# Patient Record
Sex: Female | Born: 1945
Health system: Southern US, Community
[De-identification: ages and names within clinical notes are randomized; demographics above are authoritative.]

## PROBLEM LIST (undated history)

## (undated) DIAGNOSIS — D649 Anemia, unspecified: Secondary | ICD-10-CM

## (undated) DIAGNOSIS — Z8719 Personal history of other diseases of the digestive system: Secondary | ICD-10-CM

## (undated) DIAGNOSIS — R931 Abnormal findings on diagnostic imaging of heart and coronary circulation: Secondary | ICD-10-CM

## (undated) DIAGNOSIS — Z9289 Personal history of other medical treatment: Secondary | ICD-10-CM

## (undated) DIAGNOSIS — F419 Anxiety disorder, unspecified: Secondary | ICD-10-CM

## (undated) DIAGNOSIS — R079 Chest pain, unspecified: Secondary | ICD-10-CM

## (undated) DIAGNOSIS — R350 Frequency of micturition: Secondary | ICD-10-CM

## (undated) DIAGNOSIS — IMO0002 Reserved for concepts with insufficient information to code with codable children: Secondary | ICD-10-CM

## (undated) DIAGNOSIS — R3915 Urgency of urination: Secondary | ICD-10-CM

## (undated) DIAGNOSIS — R0989 Other specified symptoms and signs involving the circulatory and respiratory systems: Secondary | ICD-10-CM

## (undated) DIAGNOSIS — T7840XA Allergy, unspecified, initial encounter: Secondary | ICD-10-CM

## (undated) DIAGNOSIS — E164 Increased secretion of gastrin: Secondary | ICD-10-CM

## (undated) DIAGNOSIS — N3010 Interstitial cystitis (chronic) without hematuria: Secondary | ICD-10-CM

## (undated) DIAGNOSIS — R112 Nausea with vomiting, unspecified: Secondary | ICD-10-CM

## (undated) DIAGNOSIS — N83209 Unspecified ovarian cyst, unspecified side: Secondary | ICD-10-CM

## (undated) DIAGNOSIS — N189 Chronic kidney disease, unspecified: Secondary | ICD-10-CM

## (undated) DIAGNOSIS — R011 Cardiac murmur, unspecified: Secondary | ICD-10-CM

## (undated) DIAGNOSIS — M810 Age-related osteoporosis without current pathological fracture: Secondary | ICD-10-CM

## (undated) DIAGNOSIS — M1990 Unspecified osteoarthritis, unspecified site: Secondary | ICD-10-CM

## (undated) DIAGNOSIS — Z634 Disappearance and death of family member: Secondary | ICD-10-CM

## (undated) DIAGNOSIS — IMO0001 Reserved for inherently not codable concepts without codable children: Secondary | ICD-10-CM

## (undated) DIAGNOSIS — G8929 Other chronic pain: Secondary | ICD-10-CM

## (undated) DIAGNOSIS — R300 Dysuria: Secondary | ICD-10-CM

## (undated) DIAGNOSIS — N390 Urinary tract infection, site not specified: Secondary | ICD-10-CM

## (undated) DIAGNOSIS — H269 Unspecified cataract: Secondary | ICD-10-CM

## (undated) DIAGNOSIS — E039 Hypothyroidism, unspecified: Secondary | ICD-10-CM

## (undated) DIAGNOSIS — Z9889 Other specified postprocedural states: Secondary | ICD-10-CM

## (undated) DIAGNOSIS — I509 Heart failure, unspecified: Secondary | ICD-10-CM

## (undated) DIAGNOSIS — R42 Dizziness and giddiness: Secondary | ICD-10-CM

## (undated) DIAGNOSIS — M25469 Effusion, unspecified knee: Secondary | ICD-10-CM

## (undated) DIAGNOSIS — M545 Low back pain, unspecified: Secondary | ICD-10-CM

## (undated) DIAGNOSIS — G43909 Migraine, unspecified, not intractable, without status migrainosus: Secondary | ICD-10-CM

## (undated) DIAGNOSIS — J111 Influenza due to unidentified influenza virus with other respiratory manifestations: Secondary | ICD-10-CM

## (undated) DIAGNOSIS — E876 Hypokalemia: Secondary | ICD-10-CM

## (undated) DIAGNOSIS — R0602 Shortness of breath: Secondary | ICD-10-CM

## (undated) DIAGNOSIS — J42 Unspecified chronic bronchitis: Secondary | ICD-10-CM

## (undated) DIAGNOSIS — R937 Abnormal findings on diagnostic imaging of other parts of musculoskeletal system: Secondary | ICD-10-CM

## (undated) DIAGNOSIS — K219 Gastro-esophageal reflux disease without esophagitis: Secondary | ICD-10-CM

## (undated) DIAGNOSIS — E785 Hyperlipidemia, unspecified: Secondary | ICD-10-CM

## (undated) DIAGNOSIS — A159 Respiratory tuberculosis unspecified: Secondary | ICD-10-CM

## (undated) DIAGNOSIS — I10 Essential (primary) hypertension: Secondary | ICD-10-CM

## (undated) DIAGNOSIS — Z8711 Personal history of peptic ulcer disease: Secondary | ICD-10-CM

## (undated) HISTORY — DX: Chest pain, unspecified: R07.9

## (undated) HISTORY — DX: Reserved for inherently not codable concepts without codable children: IMO0001

## (undated) HISTORY — DX: Interstitial cystitis (chronic) without hematuria: N30.10

## (undated) HISTORY — PX: DILATION AND CURETTAGE OF UTERUS: SHX78

## (undated) HISTORY — DX: Reserved for concepts with insufficient information to code with codable children: IMO0002

## (undated) HISTORY — DX: Abnormal findings on diagnostic imaging of heart and coronary circulation: R93.1

## (undated) HISTORY — DX: Dysuria: R30.0

## (undated) HISTORY — DX: Urinary tract infection, site not specified: N39.0

## (undated) HISTORY — DX: Allergy, unspecified, initial encounter: T78.40XA

## (undated) HISTORY — DX: Heart failure, unspecified: I50.9

## (undated) HISTORY — DX: Abnormal findings on diagnostic imaging of other parts of musculoskeletal system: R93.7

## (undated) HISTORY — DX: Hypokalemia: E87.6

## (undated) HISTORY — DX: Disappearance and death of family member: Z63.4

## (undated) HISTORY — DX: Unspecified ovarian cyst, unspecified side: N83.209

## (undated) HISTORY — PX: CATARACT EXTRACTION W/ INTRAOCULAR LENS  IMPLANT, BILATERAL: SHX1307

## (undated) HISTORY — DX: Personal history of other medical treatment: Z92.89

## (undated) HISTORY — PX: CARDIAC CATHETERIZATION: SHX172

## (undated) HISTORY — DX: Chronic kidney disease, unspecified: N18.9

## (undated) HISTORY — DX: Increased secretion of gastrin: E16.4

## (undated) HISTORY — DX: Respiratory tuberculosis unspecified: A15.9

## (undated) HISTORY — DX: Anxiety disorder, unspecified: F41.9

## (undated) HISTORY — DX: Age-related osteoporosis without current pathological fracture: M81.0

## (undated) HISTORY — PX: BACK SURGERY: SHX140

## (undated) HISTORY — PX: APPENDECTOMY: SHX54

---
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
## 1970-08-26 HISTORY — PX: OVARIAN CYST SURGERY: SHX726

## 1972-08-26 DIAGNOSIS — Z9289 Personal history of other medical treatment: Secondary | ICD-10-CM

## 1972-08-26 HISTORY — PX: OOPHORECTOMY: SHX86

## 1972-08-26 HISTORY — DX: Personal history of other medical treatment: Z92.89

## 1972-08-26 HISTORY — PX: ABDOMINAL HYSTERECTOMY: SHX81

## 1982-08-26 HISTORY — PX: FOOT SURGERY: SHX648

## 1991-08-27 HISTORY — PX: MASTOIDECTOMY: SHX711

## 1991-12-25 HISTORY — PX: CHOLECYSTECTOMY OPEN: SUR202

## 1991-12-25 HISTORY — PX: LAPAROSCOPIC OVARIAN CYSTECTOMY: SUR786

## 1993-05-26 HISTORY — PX: OTHER SURGICAL HISTORY: SHX169

## 1995-11-25 HISTORY — PX: ANTERIOR AND POSTERIOR REPAIR: SHX1172

## 1995-11-25 HISTORY — PX: LAPAROSCOPIC LYSIS INTESTINAL ADHESIONS: SUR778

## 1998-03-08 ENCOUNTER — Ambulatory Visit (HOSPITAL_COMMUNITY): Admission: RE | Admit: 1998-03-08 | Discharge: 1998-03-08 | Payer: Self-pay | Admitting: Obstetrics and Gynecology

## 1998-05-31 ENCOUNTER — Encounter: Admission: RE | Admit: 1998-05-31 | Discharge: 1998-05-31 | Payer: Self-pay | Admitting: Family Medicine

## 1998-08-08 ENCOUNTER — Encounter: Admission: RE | Admit: 1998-08-08 | Discharge: 1998-08-08 | Payer: Self-pay | Admitting: Sports Medicine

## 1998-08-21 ENCOUNTER — Emergency Department (HOSPITAL_COMMUNITY): Admission: EM | Admit: 1998-08-21 | Discharge: 1998-08-21 | Payer: Self-pay | Admitting: Emergency Medicine

## 1998-08-29 ENCOUNTER — Encounter: Admission: RE | Admit: 1998-08-29 | Discharge: 1998-08-29 | Payer: Self-pay | Admitting: Family Medicine

## 1998-12-04 ENCOUNTER — Other Ambulatory Visit: Admission: RE | Admit: 1998-12-04 | Discharge: 1998-12-04 | Payer: Self-pay | Admitting: Obstetrics and Gynecology

## 1998-12-25 ENCOUNTER — Encounter: Admission: RE | Admit: 1998-12-25 | Discharge: 1998-12-25 | Payer: Self-pay | Admitting: Family Medicine

## 1998-12-28 ENCOUNTER — Encounter: Admission: RE | Admit: 1998-12-28 | Discharge: 1998-12-28 | Payer: Self-pay | Admitting: Family Medicine

## 1999-01-02 ENCOUNTER — Encounter: Admission: RE | Admit: 1999-01-02 | Discharge: 1999-01-02 | Payer: Self-pay | Admitting: Family Medicine

## 1999-01-30 ENCOUNTER — Encounter: Admission: RE | Admit: 1999-01-30 | Discharge: 1999-01-30 | Payer: Self-pay | Admitting: Family Medicine

## 1999-04-03 ENCOUNTER — Ambulatory Visit (HOSPITAL_COMMUNITY): Admission: RE | Admit: 1999-04-03 | Discharge: 1999-04-03 | Payer: Self-pay | Admitting: Family Medicine

## 1999-04-27 ENCOUNTER — Encounter (INDEPENDENT_AMBULATORY_CARE_PROVIDER_SITE_OTHER): Payer: Self-pay | Admitting: Specialist

## 1999-04-27 ENCOUNTER — Ambulatory Visit (HOSPITAL_COMMUNITY): Admission: RE | Admit: 1999-04-27 | Discharge: 1999-04-27 | Payer: Self-pay | Admitting: Gastroenterology

## 1999-06-21 ENCOUNTER — Encounter: Payer: Self-pay | Admitting: Family Medicine

## 1999-06-21 ENCOUNTER — Encounter: Admission: RE | Admit: 1999-06-21 | Discharge: 1999-06-21 | Payer: Self-pay | Admitting: Family Medicine

## 1999-07-27 DIAGNOSIS — E164 Increased secretion of gastrin: Secondary | ICD-10-CM

## 1999-07-27 HISTORY — DX: Increased secretion of gastrin: E16.4

## 1999-08-13 ENCOUNTER — Encounter (INDEPENDENT_AMBULATORY_CARE_PROVIDER_SITE_OTHER): Payer: Self-pay

## 1999-08-13 ENCOUNTER — Ambulatory Visit (HOSPITAL_COMMUNITY): Admission: RE | Admit: 1999-08-13 | Discharge: 1999-08-13 | Payer: Self-pay | Admitting: Gastroenterology

## 1999-08-26 ENCOUNTER — Emergency Department (HOSPITAL_COMMUNITY): Admission: EM | Admit: 1999-08-26 | Discharge: 1999-08-26 | Payer: Self-pay | Admitting: Emergency Medicine

## 1999-09-11 ENCOUNTER — Encounter: Admission: RE | Admit: 1999-09-11 | Discharge: 1999-09-11 | Payer: Self-pay | Admitting: Family Medicine

## 1999-09-14 ENCOUNTER — Encounter: Admission: RE | Admit: 1999-09-14 | Discharge: 1999-09-14 | Payer: Self-pay | Admitting: Family Medicine

## 1999-09-19 ENCOUNTER — Encounter: Admission: RE | Admit: 1999-09-19 | Discharge: 1999-09-19 | Payer: Self-pay | Admitting: Family Medicine

## 1999-09-28 ENCOUNTER — Ambulatory Visit (HOSPITAL_COMMUNITY): Admission: RE | Admit: 1999-09-28 | Discharge: 1999-09-28 | Payer: Self-pay | Admitting: Urology

## 1999-09-28 ENCOUNTER — Encounter: Payer: Self-pay | Admitting: Urology

## 1999-10-12 ENCOUNTER — Encounter (INDEPENDENT_AMBULATORY_CARE_PROVIDER_SITE_OTHER): Payer: Self-pay | Admitting: Specialist

## 1999-10-12 ENCOUNTER — Ambulatory Visit (HOSPITAL_COMMUNITY): Admission: RE | Admit: 1999-10-12 | Discharge: 1999-10-12 | Payer: Self-pay | Admitting: Urology

## 1999-10-25 DIAGNOSIS — N3010 Interstitial cystitis (chronic) without hematuria: Secondary | ICD-10-CM

## 1999-10-25 HISTORY — DX: Interstitial cystitis (chronic) without hematuria: N30.10

## 1999-11-06 ENCOUNTER — Encounter: Admission: RE | Admit: 1999-11-06 | Discharge: 1999-11-06 | Payer: Self-pay | Admitting: Family Medicine

## 1999-11-20 ENCOUNTER — Encounter: Admission: RE | Admit: 1999-11-20 | Discharge: 1999-11-20 | Payer: Self-pay | Admitting: Family Medicine

## 1999-12-19 ENCOUNTER — Other Ambulatory Visit: Admission: RE | Admit: 1999-12-19 | Discharge: 1999-12-19 | Payer: Self-pay | Admitting: Obstetrics and Gynecology

## 2000-01-31 ENCOUNTER — Encounter: Admission: RE | Admit: 2000-01-31 | Discharge: 2000-01-31 | Payer: Self-pay | Admitting: Family Medicine

## 2000-04-10 ENCOUNTER — Ambulatory Visit (HOSPITAL_COMMUNITY): Admission: RE | Admit: 2000-04-10 | Discharge: 2000-04-10 | Payer: Self-pay | Admitting: Family Medicine

## 2000-04-11 ENCOUNTER — Encounter: Admission: RE | Admit: 2000-04-11 | Discharge: 2000-04-11 | Payer: Self-pay | Admitting: Family Medicine

## 2000-08-05 ENCOUNTER — Encounter: Admission: RE | Admit: 2000-08-05 | Discharge: 2000-08-05 | Payer: Self-pay | Admitting: Family Medicine

## 2000-08-14 ENCOUNTER — Encounter: Payer: Self-pay | Admitting: Obstetrics and Gynecology

## 2000-08-14 ENCOUNTER — Ambulatory Visit (HOSPITAL_COMMUNITY): Admission: RE | Admit: 2000-08-14 | Discharge: 2000-08-14 | Payer: Self-pay | Admitting: Obstetrics and Gynecology

## 2000-09-12 ENCOUNTER — Encounter: Admission: RE | Admit: 2000-09-12 | Discharge: 2000-09-12 | Payer: Self-pay | Admitting: Family Medicine

## 2000-09-16 ENCOUNTER — Encounter: Admission: RE | Admit: 2000-09-16 | Discharge: 2000-09-16 | Payer: Self-pay | Admitting: Family Medicine

## 2000-11-24 ENCOUNTER — Encounter: Admission: RE | Admit: 2000-11-24 | Discharge: 2000-11-24 | Payer: Self-pay | Admitting: Family Medicine

## 2000-12-02 ENCOUNTER — Encounter: Admission: RE | Admit: 2000-12-02 | Discharge: 2000-12-02 | Payer: Self-pay | Admitting: Family Medicine

## 2001-02-03 DIAGNOSIS — IMO0001 Reserved for inherently not codable concepts without codable children: Secondary | ICD-10-CM

## 2001-02-03 HISTORY — DX: Reserved for inherently not codable concepts without codable children: IMO0001

## 2001-02-20 ENCOUNTER — Other Ambulatory Visit: Admission: RE | Admit: 2001-02-20 | Discharge: 2001-02-20 | Payer: Self-pay | Admitting: Obstetrics and Gynecology

## 2001-03-20 ENCOUNTER — Encounter: Admission: RE | Admit: 2001-03-20 | Discharge: 2001-03-20 | Payer: Self-pay | Admitting: Family Medicine

## 2001-04-15 ENCOUNTER — Ambulatory Visit (HOSPITAL_COMMUNITY): Admission: RE | Admit: 2001-04-15 | Discharge: 2001-04-15 | Payer: Self-pay | Admitting: Obstetrics and Gynecology

## 2001-04-15 ENCOUNTER — Encounter: Payer: Self-pay | Admitting: Obstetrics and Gynecology

## 2002-01-25 ENCOUNTER — Encounter: Admission: RE | Admit: 2002-01-25 | Discharge: 2002-01-25 | Payer: Self-pay | Admitting: Family Medicine

## 2002-02-18 ENCOUNTER — Encounter: Admission: RE | Admit: 2002-02-18 | Discharge: 2002-02-18 | Payer: Self-pay | Admitting: Family Medicine

## 2002-02-19 ENCOUNTER — Other Ambulatory Visit: Admission: RE | Admit: 2002-02-19 | Discharge: 2002-02-19 | Payer: Self-pay | Admitting: Obstetrics and Gynecology

## 2002-05-28 ENCOUNTER — Encounter (INDEPENDENT_AMBULATORY_CARE_PROVIDER_SITE_OTHER): Payer: Self-pay | Admitting: Specialist

## 2002-05-28 ENCOUNTER — Ambulatory Visit (HOSPITAL_COMMUNITY): Admission: RE | Admit: 2002-05-28 | Discharge: 2002-05-28 | Payer: Self-pay | Admitting: Gastroenterology

## 2002-09-15 ENCOUNTER — Encounter: Admission: RE | Admit: 2002-09-15 | Discharge: 2002-09-15 | Payer: Self-pay | Admitting: Family Medicine

## 2002-09-21 HISTORY — PX: CHOLESTEATOMA EXCISION: SHX1345

## 2002-09-24 ENCOUNTER — Encounter: Admission: RE | Admit: 2002-09-24 | Discharge: 2002-09-24 | Payer: Self-pay | Admitting: Family Medicine

## 2002-10-13 ENCOUNTER — Encounter: Admission: RE | Admit: 2002-10-13 | Discharge: 2002-10-13 | Payer: Self-pay | Admitting: Family Medicine

## 2002-10-25 ENCOUNTER — Encounter: Admission: RE | Admit: 2002-10-25 | Discharge: 2002-10-25 | Payer: Self-pay | Admitting: Family Medicine

## 2002-10-28 DIAGNOSIS — R937 Abnormal findings on diagnostic imaging of other parts of musculoskeletal system: Secondary | ICD-10-CM

## 2002-10-28 HISTORY — DX: Abnormal findings on diagnostic imaging of other parts of musculoskeletal system: R93.7

## 2002-12-22 ENCOUNTER — Encounter: Admission: RE | Admit: 2002-12-22 | Discharge: 2002-12-22 | Payer: Self-pay | Admitting: Internal Medicine

## 2002-12-28 ENCOUNTER — Encounter: Admission: RE | Admit: 2002-12-28 | Discharge: 2002-12-28 | Payer: Self-pay | Admitting: Family Medicine

## 2003-05-20 ENCOUNTER — Other Ambulatory Visit: Admission: RE | Admit: 2003-05-20 | Discharge: 2003-05-20 | Payer: Self-pay | Admitting: Obstetrics and Gynecology

## 2003-06-20 ENCOUNTER — Encounter: Payer: Self-pay | Admitting: Obstetrics and Gynecology

## 2003-06-20 ENCOUNTER — Ambulatory Visit (HOSPITAL_COMMUNITY): Admission: RE | Admit: 2003-06-20 | Discharge: 2003-06-20 | Payer: Self-pay | Admitting: Obstetrics and Gynecology

## 2003-09-06 ENCOUNTER — Encounter: Admission: RE | Admit: 2003-09-06 | Discharge: 2003-09-06 | Payer: Self-pay | Admitting: Family Medicine

## 2003-11-15 ENCOUNTER — Encounter: Admission: RE | Admit: 2003-11-15 | Discharge: 2003-11-15 | Payer: Self-pay | Admitting: Family Medicine

## 2003-12-13 ENCOUNTER — Ambulatory Visit (HOSPITAL_COMMUNITY): Admission: RE | Admit: 2003-12-13 | Discharge: 2003-12-13 | Payer: Self-pay | Admitting: Family Medicine

## 2003-12-13 ENCOUNTER — Encounter: Admission: RE | Admit: 2003-12-13 | Discharge: 2003-12-13 | Payer: Self-pay | Admitting: Family Medicine

## 2004-01-12 ENCOUNTER — Ambulatory Visit (HOSPITAL_COMMUNITY): Admission: RE | Admit: 2004-01-12 | Discharge: 2004-01-12 | Payer: Self-pay | Admitting: Family Medicine

## 2004-03-09 ENCOUNTER — Encounter: Admission: RE | Admit: 2004-03-09 | Discharge: 2004-03-09 | Payer: Self-pay | Admitting: Family Medicine

## 2004-05-26 HISTORY — PX: EPIDURAL BLOCK INJECTION: SHX1516

## 2004-06-12 ENCOUNTER — Ambulatory Visit: Payer: Self-pay | Admitting: Family Medicine

## 2004-06-27 ENCOUNTER — Encounter: Admission: RE | Admit: 2004-06-27 | Discharge: 2004-06-27 | Payer: Self-pay | Admitting: Family Medicine

## 2004-07-27 ENCOUNTER — Other Ambulatory Visit: Admission: RE | Admit: 2004-07-27 | Discharge: 2004-07-27 | Payer: Self-pay | Admitting: Obstetrics and Gynecology

## 2004-10-09 ENCOUNTER — Ambulatory Visit: Payer: Self-pay | Admitting: Family Medicine

## 2004-10-09 ENCOUNTER — Encounter: Payer: Self-pay | Admitting: Family Medicine

## 2004-10-31 ENCOUNTER — Ambulatory Visit: Payer: Self-pay | Admitting: Family Medicine

## 2004-12-05 ENCOUNTER — Ambulatory Visit (HOSPITAL_COMMUNITY): Payer: Self-pay | Admitting: Psychiatry

## 2005-01-25 ENCOUNTER — Ambulatory Visit: Payer: Self-pay | Admitting: Psychiatry

## 2005-01-25 ENCOUNTER — Ambulatory Visit (HOSPITAL_COMMUNITY): Payer: Self-pay | Admitting: Psychiatry

## 2005-03-28 ENCOUNTER — Ambulatory Visit: Payer: Self-pay | Admitting: Sports Medicine

## 2005-06-03 ENCOUNTER — Ambulatory Visit: Payer: Self-pay | Admitting: Family Medicine

## 2005-10-01 ENCOUNTER — Ambulatory Visit: Payer: Self-pay | Admitting: Family Medicine

## 2005-10-01 ENCOUNTER — Ambulatory Visit (HOSPITAL_COMMUNITY): Admission: RE | Admit: 2005-10-01 | Discharge: 2005-10-01 | Payer: Self-pay | Admitting: Family Medicine

## 2006-10-23 DIAGNOSIS — M899 Disorder of bone, unspecified: Secondary | ICD-10-CM | POA: Insufficient documentation

## 2006-10-23 DIAGNOSIS — K273 Acute peptic ulcer, site unspecified, without hemorrhage or perforation: Secondary | ICD-10-CM | POA: Insufficient documentation

## 2006-10-23 DIAGNOSIS — M949 Disorder of cartilage, unspecified: Secondary | ICD-10-CM

## 2006-10-23 DIAGNOSIS — N3010 Interstitial cystitis (chronic) without hematuria: Secondary | ICD-10-CM | POA: Insufficient documentation

## 2006-10-23 DIAGNOSIS — K219 Gastro-esophageal reflux disease without esophagitis: Secondary | ICD-10-CM | POA: Insufficient documentation

## 2006-10-23 DIAGNOSIS — IMO0002 Reserved for concepts with insufficient information to code with codable children: Secondary | ICD-10-CM | POA: Insufficient documentation

## 2006-10-23 DIAGNOSIS — J329 Chronic sinusitis, unspecified: Secondary | ICD-10-CM | POA: Insufficient documentation

## 2006-10-23 DIAGNOSIS — M159 Polyosteoarthritis, unspecified: Secondary | ICD-10-CM | POA: Insufficient documentation

## 2006-10-23 DIAGNOSIS — K589 Irritable bowel syndrome without diarrhea: Secondary | ICD-10-CM | POA: Insufficient documentation

## 2006-10-23 DIAGNOSIS — F450 Somatization disorder: Secondary | ICD-10-CM | POA: Insufficient documentation

## 2006-11-13 ENCOUNTER — Telehealth: Payer: Self-pay | Admitting: Family Medicine

## 2006-11-21 ENCOUNTER — Encounter: Admission: RE | Admit: 2006-11-21 | Discharge: 2006-11-21 | Payer: Self-pay | Admitting: Obstetrics and Gynecology

## 2006-12-23 ENCOUNTER — Ambulatory Visit: Payer: Self-pay | Admitting: Family Medicine

## 2006-12-23 ENCOUNTER — Encounter: Payer: Self-pay | Admitting: Family Medicine

## 2006-12-23 DIAGNOSIS — I10 Essential (primary) hypertension: Secondary | ICD-10-CM | POA: Insufficient documentation

## 2006-12-24 LAB — CONVERTED CEMR LAB
Alkaline Phosphatase: 71 units/L (ref 39–117)
BUN: 10 mg/dL (ref 6–23)
Creatinine, Ser: 0.74 mg/dL (ref 0.40–1.20)
Glucose, Bld: 84 mg/dL (ref 70–99)
Sodium: 139 meq/L (ref 135–145)
Total Bilirubin: 0.2 mg/dL — ABNORMAL LOW (ref 0.3–1.2)

## 2007-02-20 ENCOUNTER — Encounter: Payer: Self-pay | Admitting: Family Medicine

## 2007-08-10 ENCOUNTER — Ambulatory Visit: Payer: Self-pay | Admitting: Sports Medicine

## 2007-08-27 HISTORY — PX: COLONOSCOPY: SHX174

## 2007-08-27 HISTORY — PX: ESOPHAGOGASTRODUODENOSCOPY: SHX1529

## 2007-08-28 ENCOUNTER — Ambulatory Visit: Payer: Self-pay | Admitting: Family Medicine

## 2007-08-31 ENCOUNTER — Telehealth: Payer: Self-pay | Admitting: Family Medicine

## 2007-09-27 HISTORY — PX: TYMPANOPLASTY W/ MASTOIDECTOMY: SUR1400

## 2007-10-02 ENCOUNTER — Encounter: Payer: Self-pay | Admitting: Family Medicine

## 2007-12-11 ENCOUNTER — Encounter: Payer: Self-pay | Admitting: Family Medicine

## 2007-12-14 ENCOUNTER — Encounter: Admission: RE | Admit: 2007-12-14 | Discharge: 2007-12-14 | Payer: Self-pay | Admitting: Gastroenterology

## 2007-12-23 ENCOUNTER — Ambulatory Visit (HOSPITAL_COMMUNITY): Admission: RE | Admit: 2007-12-23 | Discharge: 2007-12-23 | Payer: Self-pay | Admitting: Obstetrics and Gynecology

## 2008-04-19 ENCOUNTER — Telehealth: Payer: Self-pay | Admitting: *Deleted

## 2008-05-24 ENCOUNTER — Telehealth: Payer: Self-pay | Admitting: *Deleted

## 2008-05-26 ENCOUNTER — Ambulatory Visit: Payer: Self-pay | Admitting: Family Medicine

## 2008-06-06 ENCOUNTER — Encounter: Payer: Self-pay | Admitting: Family Medicine

## 2008-06-06 ENCOUNTER — Ambulatory Visit: Payer: Self-pay | Admitting: Family Medicine

## 2008-06-06 LAB — CONVERTED CEMR LAB
CO2: 24 meq/L (ref 19–32)
Calcium: 9.4 mg/dL (ref 8.4–10.5)
Cholesterol: 223 mg/dL — ABNORMAL HIGH (ref 0–200)
Creatinine, Ser: 0.69 mg/dL (ref 0.40–1.20)
Glucose, Bld: 90 mg/dL (ref 70–99)
HCT: 39.6 % (ref 36.0–46.0)
MCV: 90.2 fL (ref 78.0–100.0)
RBC: 4.39 M/uL (ref 3.87–5.11)
Total Bilirubin: 0.5 mg/dL (ref 0.3–1.2)
Total CHOL/HDL Ratio: 4.1
Total Protein: 6.9 g/dL (ref 6.0–8.3)
Triglycerides: 140 mg/dL (ref <150)
VLDL: 28 mg/dL (ref 0–40)
WBC: 4.3 10*3/uL (ref 4.0–10.5)

## 2008-06-07 ENCOUNTER — Encounter: Payer: Self-pay | Admitting: Family Medicine

## 2008-06-09 ENCOUNTER — Telehealth (INDEPENDENT_AMBULATORY_CARE_PROVIDER_SITE_OTHER): Payer: Self-pay | Admitting: *Deleted

## 2008-06-20 DIAGNOSIS — R931 Abnormal findings on diagnostic imaging of heart and coronary circulation: Secondary | ICD-10-CM

## 2008-06-20 HISTORY — DX: Abnormal findings on diagnostic imaging of heart and coronary circulation: R93.1

## 2008-09-29 ENCOUNTER — Ambulatory Visit: Payer: Self-pay | Admitting: Family Medicine

## 2008-09-29 ENCOUNTER — Telehealth: Payer: Self-pay | Admitting: Family Medicine

## 2008-10-05 ENCOUNTER — Telehealth: Payer: Self-pay | Admitting: *Deleted

## 2008-11-28 ENCOUNTER — Encounter: Payer: Self-pay | Admitting: Family Medicine

## 2008-12-05 ENCOUNTER — Encounter: Payer: Self-pay | Admitting: Family Medicine

## 2008-12-06 ENCOUNTER — Encounter: Payer: Self-pay | Admitting: *Deleted

## 2008-12-06 ENCOUNTER — Ambulatory Visit: Payer: Self-pay | Admitting: Family Medicine

## 2008-12-06 DIAGNOSIS — J301 Allergic rhinitis due to pollen: Secondary | ICD-10-CM | POA: Insufficient documentation

## 2008-12-06 DIAGNOSIS — G43009 Migraine without aura, not intractable, without status migrainosus: Secondary | ICD-10-CM | POA: Insufficient documentation

## 2008-12-06 DIAGNOSIS — G43709 Chronic migraine without aura, not intractable, without status migrainosus: Secondary | ICD-10-CM | POA: Insufficient documentation

## 2008-12-06 LAB — CONVERTED CEMR LAB
CO2: 29 meq/L (ref 19–32)
Calcium: 10 mg/dL (ref 8.4–10.5)
Chloride: 102 meq/L (ref 96–112)
Glucose, Bld: 75 mg/dL (ref 70–99)
Sodium: 142 meq/L (ref 135–145)

## 2008-12-07 ENCOUNTER — Encounter: Payer: Self-pay | Admitting: Family Medicine

## 2009-01-03 ENCOUNTER — Ambulatory Visit (HOSPITAL_COMMUNITY): Admission: RE | Admit: 2009-01-03 | Discharge: 2009-01-03 | Payer: Self-pay | Admitting: Family Medicine

## 2009-03-26 IMAGING — MG MM DIGITAL SCREENING BILAT
6 series · 6 of 6 positions shown · non-contrast
Comparison: Prior studies.

DG SCREEN MAMMOGRAM BILATERAL
Bilateral CC and MLO view(s) were taken.

DIGITAL SCREENING MAMMOGRAM WITH CAD:

[R CC]
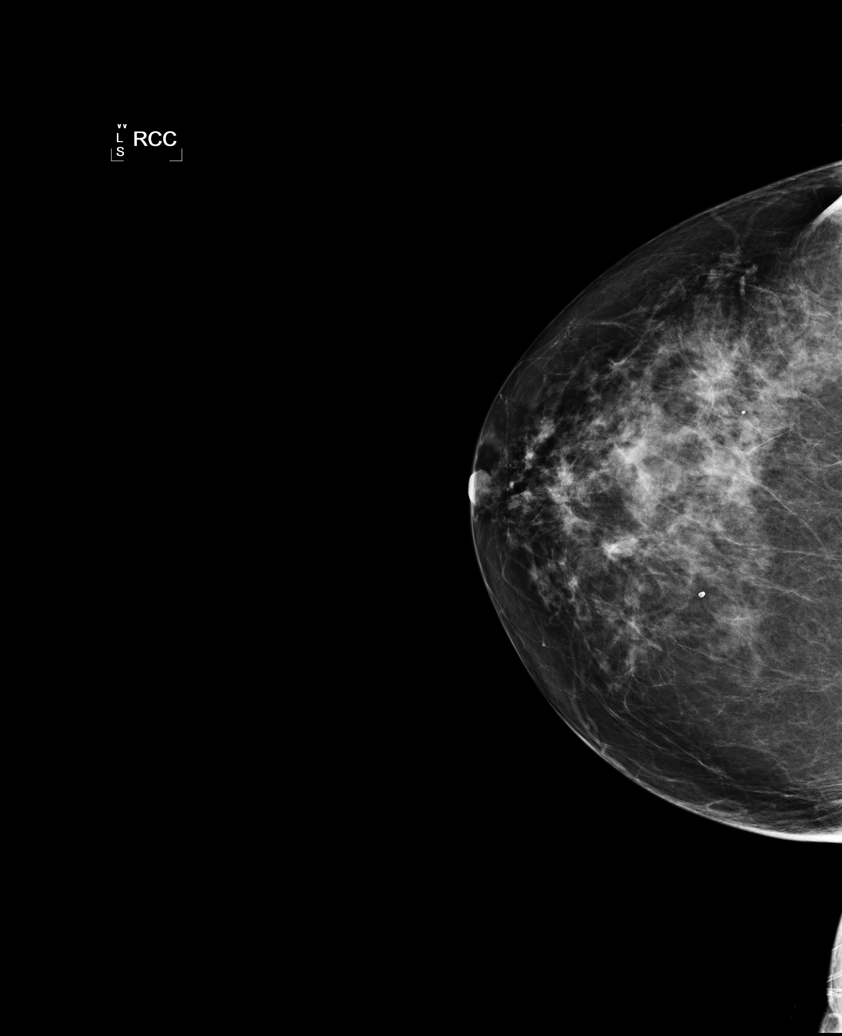

[R MLO (1 of 2)]
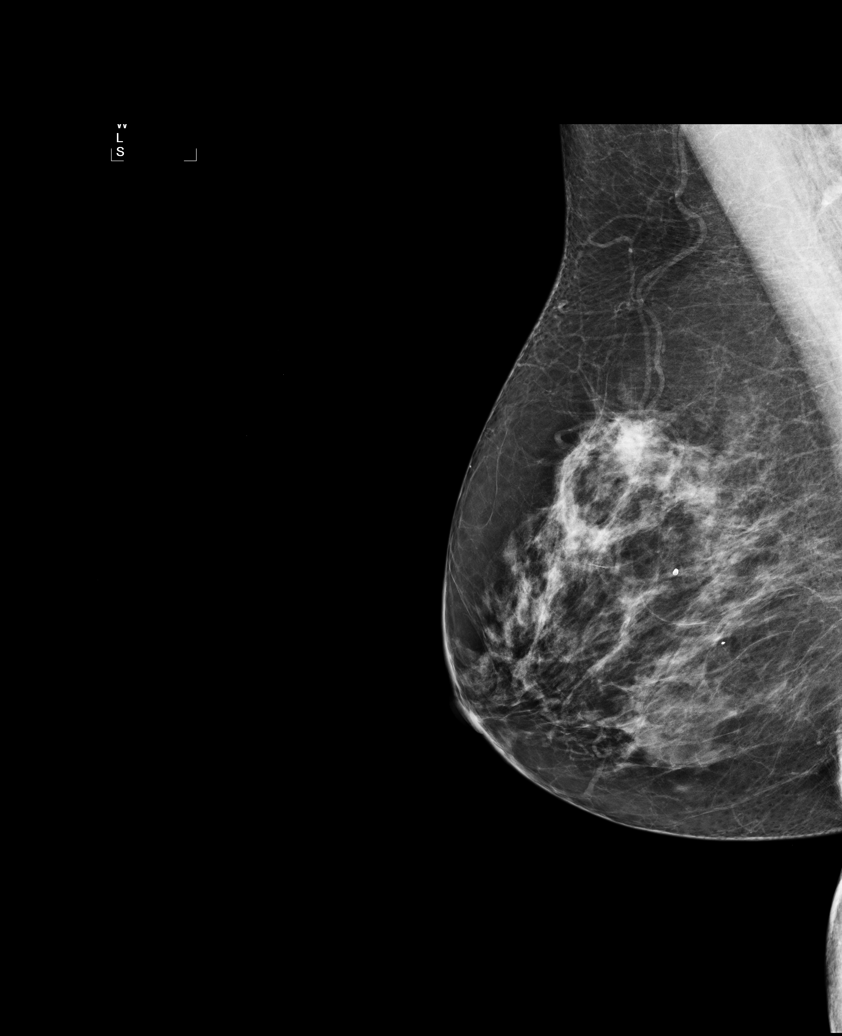

[L CC]
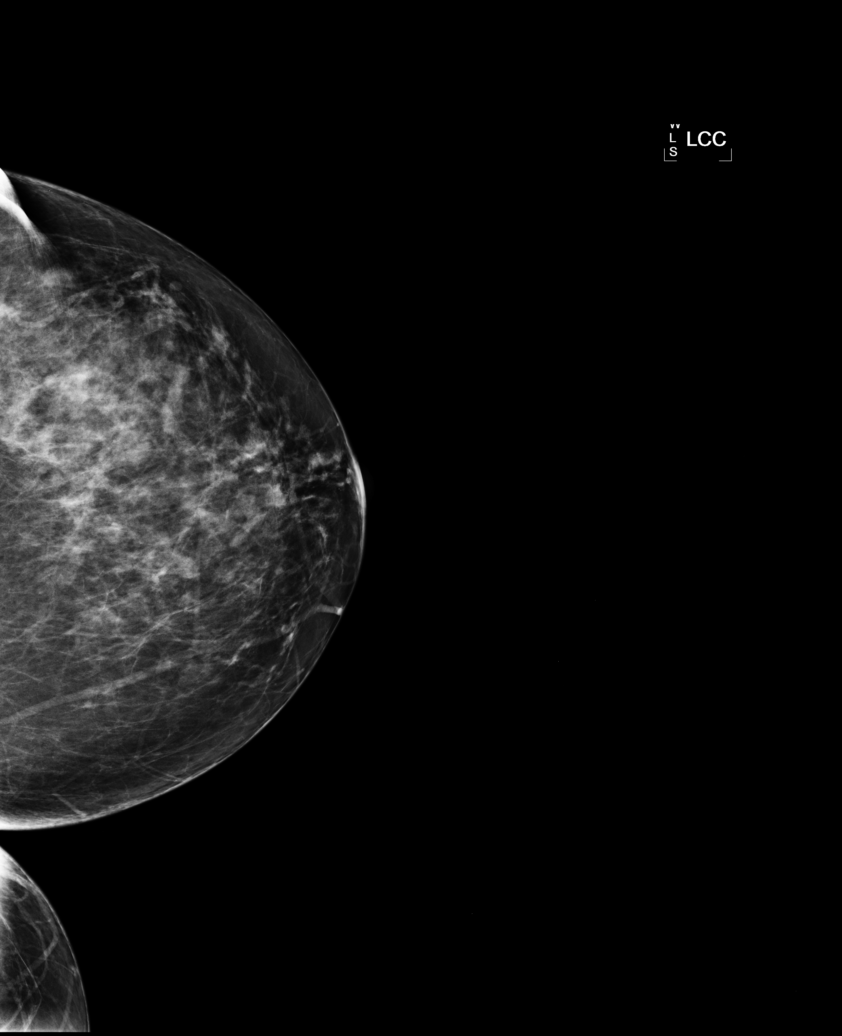

[L MLO (1 of 2)]
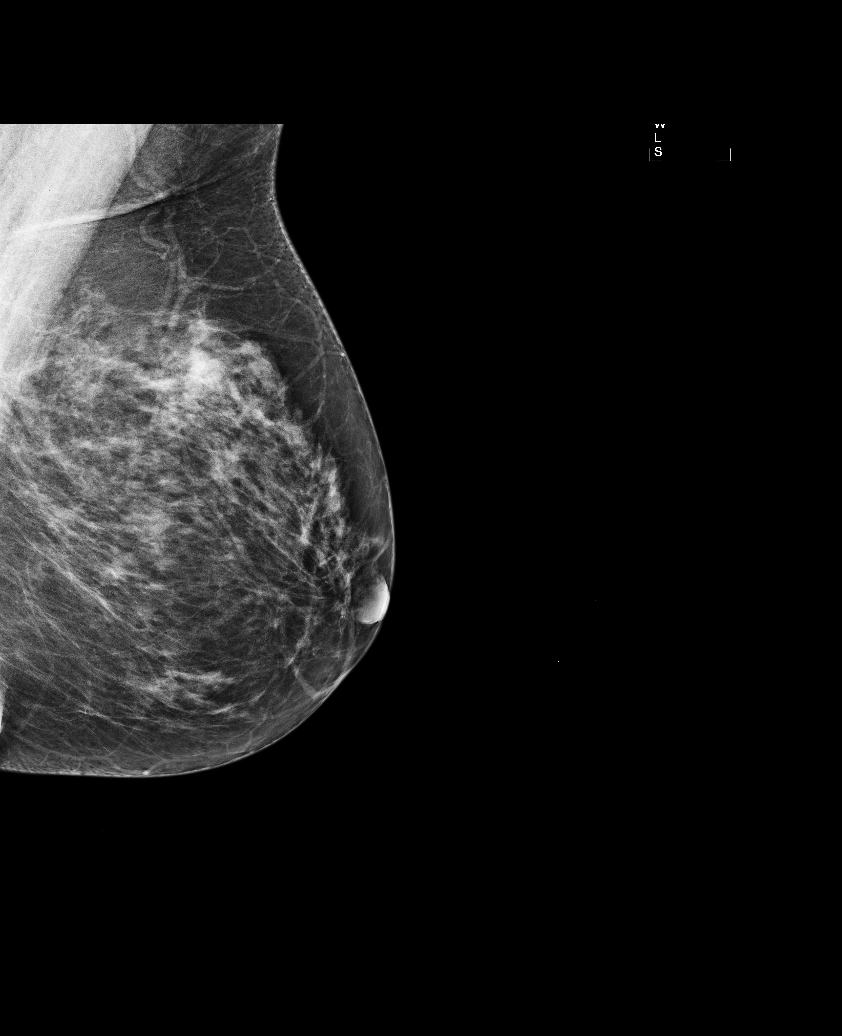

[L MLO (2 of 2)]
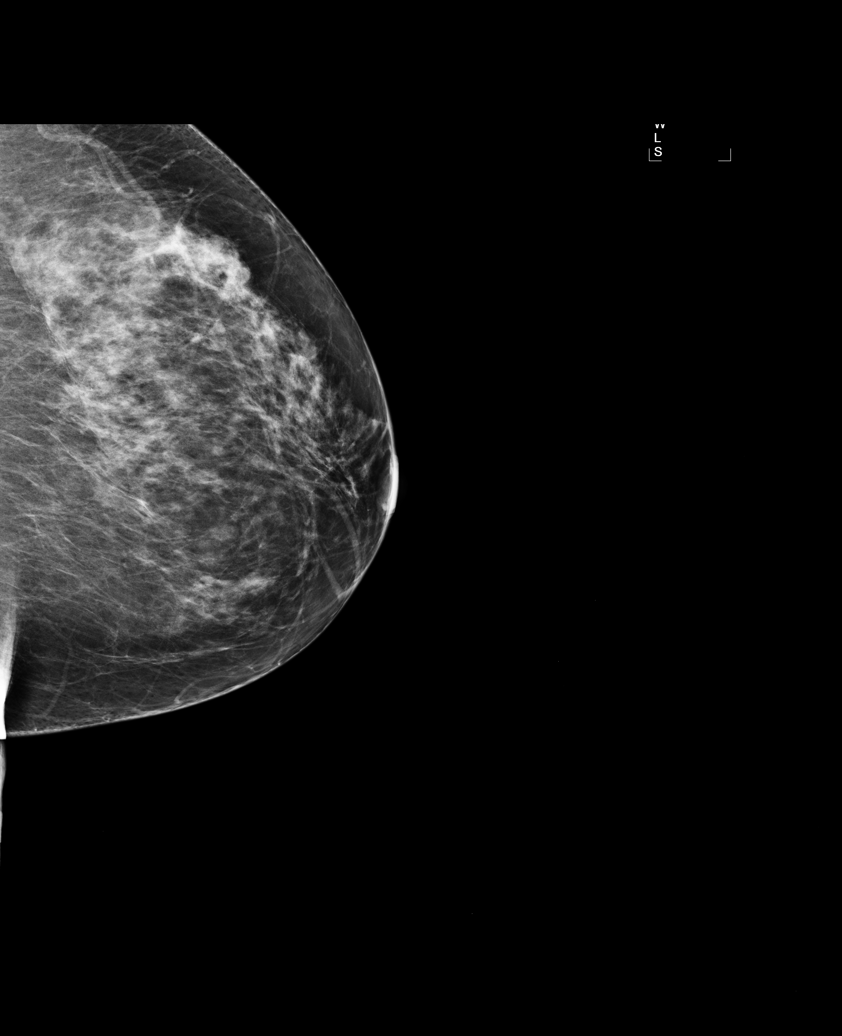

[R MLO (2 of 2)]
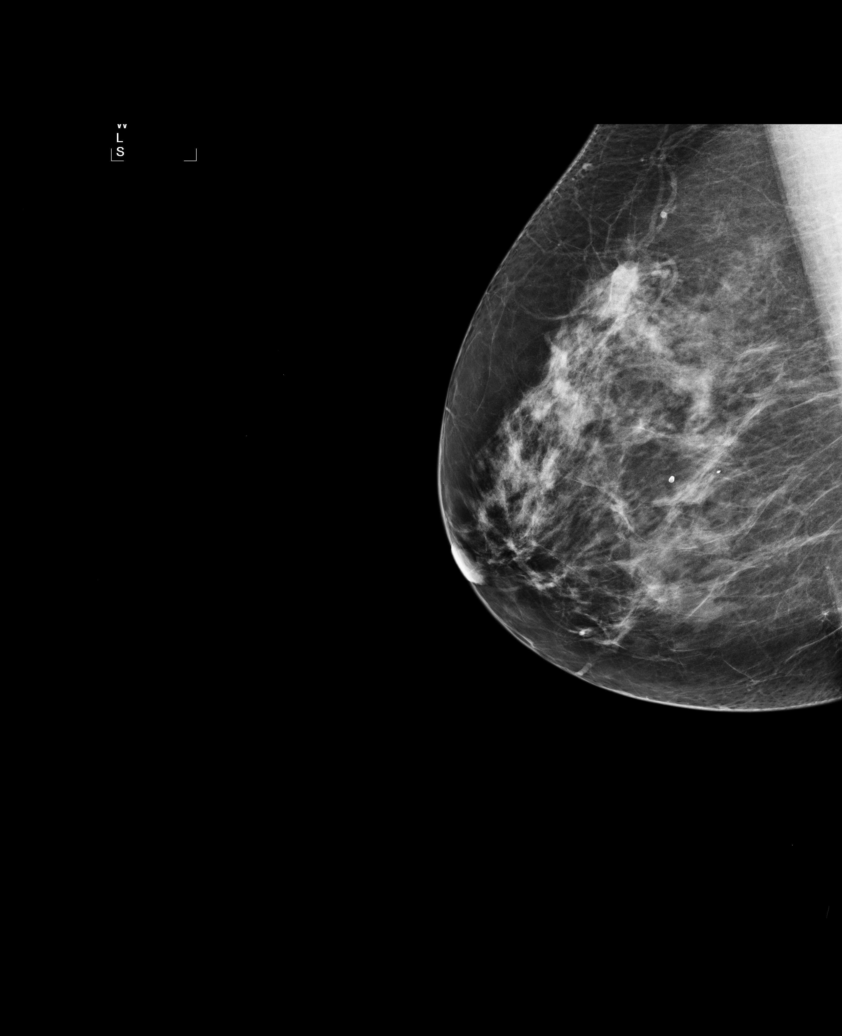

[6 of 6 positions shown; findings below may reference images not displayed]

The breast tissue is heterogeneously dense.  There is no dominant mass, architectural distortion or
calcification to suggest malignancy.
IMPRESSION: No mammographic evidence of malignancy.  Suggest yearly screening mammography.

ASSESSMENT: Negative - BI-RADS 1

Screening mammogram in 1 year.
ANALYZED BY COMPUTER AIDED DETECTION. , THIS PROCEDURE WAS A DIGITAL MAMMOGRAM.

## 2009-06-12 ENCOUNTER — Encounter: Payer: Self-pay | Admitting: *Deleted

## 2009-06-13 ENCOUNTER — Ambulatory Visit: Payer: Self-pay | Admitting: Family Medicine

## 2009-06-13 LAB — CONVERTED CEMR LAB
ALT: 20 units/L (ref 0–35)
BUN: 10 mg/dL (ref 6–23)
CO2: 25 meq/L (ref 19–32)
Calcium: 9.1 mg/dL (ref 8.4–10.5)
Creatinine, Ser: 0.67 mg/dL (ref 0.40–1.20)
Direct LDL: 158 mg/dL — ABNORMAL HIGH
Total Bilirubin: 0.3 mg/dL (ref 0.3–1.2)

## 2009-06-29 ENCOUNTER — Encounter: Payer: Self-pay | Admitting: Family Medicine

## 2009-07-05 ENCOUNTER — Encounter: Payer: Self-pay | Admitting: Family Medicine

## 2009-07-06 ENCOUNTER — Telehealth: Payer: Self-pay | Admitting: *Deleted

## 2009-09-13 ENCOUNTER — Encounter: Payer: Self-pay | Admitting: Family Medicine

## 2009-09-13 ENCOUNTER — Ambulatory Visit: Payer: Self-pay | Admitting: Family Medicine

## 2009-09-13 LAB — CONVERTED CEMR LAB
Specific Gravity, Urine: 1.01
pH: 7.5

## 2009-09-15 ENCOUNTER — Encounter: Payer: Self-pay | Admitting: Family Medicine

## 2010-01-25 ENCOUNTER — Ambulatory Visit: Payer: Self-pay | Admitting: Family Medicine

## 2010-01-25 DIAGNOSIS — N951 Menopausal and female climacteric states: Secondary | ICD-10-CM | POA: Insufficient documentation

## 2010-01-25 DIAGNOSIS — M169 Osteoarthritis of hip, unspecified: Secondary | ICD-10-CM | POA: Insufficient documentation

## 2010-01-25 DIAGNOSIS — M1610 Unilateral primary osteoarthritis, unspecified hip: Secondary | ICD-10-CM | POA: Insufficient documentation

## 2010-01-25 LAB — CONVERTED CEMR LAB
Calcium: 10.5 mg/dL (ref 8.4–10.5)
Cholesterol: 261 mg/dL — ABNORMAL HIGH (ref 0–200)
Creatinine, Ser: 0.78 mg/dL (ref 0.40–1.20)
HCT: 41.3 % (ref 36.0–46.0)
HDL: 61 mg/dL (ref >39)
MCHC: 32 g/dL (ref 30.0–36.0)
MCV: 90.4 fL (ref 78.0–100.0)
Platelets: 445 10*3/uL — ABNORMAL HIGH (ref 150–400)
RDW: 14.8 % (ref 11.5–15.5)
Sodium: 137 meq/L (ref 135–145)
TSH: 6.105 microintl units/mL — ABNORMAL HIGH (ref 0.350–4.500)
Total CHOL/HDL Ratio: 4.3
WBC: 11.5 10*3/uL — ABNORMAL HIGH (ref 4.0–10.5)

## 2010-01-28 ENCOUNTER — Encounter: Payer: Self-pay | Admitting: Family Medicine

## 2010-03-29 ENCOUNTER — Encounter: Payer: Self-pay | Admitting: Family Medicine

## 2010-03-29 ENCOUNTER — Ambulatory Visit: Payer: Self-pay | Admitting: Family Medicine

## 2010-03-29 DIAGNOSIS — N952 Postmenopausal atrophic vaginitis: Secondary | ICD-10-CM | POA: Insufficient documentation

## 2010-03-29 LAB — CONVERTED CEMR LAB
Chlamydia, DNA Probe: NEGATIVE
GC Probe Amp, Genital: NEGATIVE
TSH: 5.998 microintl units/mL — ABNORMAL HIGH (ref 0.350–4.500)

## 2010-04-02 ENCOUNTER — Telehealth: Payer: Self-pay | Admitting: Family Medicine

## 2010-05-13 ENCOUNTER — Emergency Department (HOSPITAL_BASED_OUTPATIENT_CLINIC_OR_DEPARTMENT_OTHER): Admission: EM | Admit: 2010-05-13 | Discharge: 2010-05-13 | Payer: Self-pay | Admitting: Emergency Medicine

## 2010-06-11 ENCOUNTER — Telehealth: Payer: Self-pay | Admitting: Family Medicine

## 2010-06-19 ENCOUNTER — Encounter: Payer: Self-pay | Admitting: Family Medicine

## 2010-06-26 ENCOUNTER — Encounter: Payer: Self-pay | Admitting: Family Medicine

## 2010-07-01 ENCOUNTER — Encounter: Payer: Self-pay | Admitting: Family Medicine

## 2010-08-03 ENCOUNTER — Encounter: Payer: Self-pay | Admitting: Family Medicine

## 2010-09-16 ENCOUNTER — Encounter: Payer: Self-pay | Admitting: Gastroenterology

## 2010-09-25 NOTE — Assessment & Plan Note (Signed)
Summary: prevention update   Last Hemoccult Result: Done. (01/24/2002 12:00:00 AM) Hemoccult Next Due:  Not Indicated Last Creatinine:  0.69 (06/06/2008 8:36:00 PM) Creatinine Next Due: 6 mo Last Potassium:  4.0 (06/06/2008 8:36:00 PM) Potassium Next Due:  6 mo

## 2010-09-25 NOTE — Progress Notes (Signed)
Summary: triage  Phone Note Call from Patient Call back at [phone]   Caller: Patient Summary of Call: has bladder inf and wants to come in today Initial call taken by: De Nurse,  June 11, 2010 1:38 PM  Follow-up for Phone Call        she has tried to get her urologist to call back twice today but has not heard back. this started this am & she is in a lot of pain. we have no appts & suggested UC. she lives in [place] which has a UC there. she will go there Follow-up by: Golden Circle RN,  June 11, 2010 2:05 PM

## 2010-09-25 NOTE — Letter (Signed)
Summary: Urine Culture -- negative  [facility]  [address]   [place], [place] [address]   Phone: [phone]  Fax: [phone]    09/15/2009  Theresa Brennan [address] [place], [place]  [address]  Dear Ms. NAPOLES,  Your urine culture is back, and there was no significant growth of any bacteria.  I hope you are feeling better.  Please finish the complete course of antibiotics I prescribed.  Sincerely, Romero Belling MD  Appended Document: Urine Culture -- negative mailed.

## 2010-09-25 NOTE — Assessment & Plan Note (Signed)
Summary: uti?,df   Vital Signs:  Patient profile:   65 year old female Menstrual status:  hysterectomy Height:      61 inches Weight:      166 pounds BMI:     31.48 BSA:     1.75 Temp:     98.1 degrees F Pulse rate:   108 / minute BP sitting:   146 / 81  Vitals Entered By: Jone Baseman CMA (September 13, 2009 2:07 PM) CC: ? UTI Is Patient Diabetic? No Pain Assessment Patient in pain? yes     Location: lower back   Primary Care Provider:  Zachery Dauer MD  CC:  ? UTI.  History of Present Illness: Urinary urgency and dysuria since last night.  Took Pyridium with some relief.  Also complains of suprapubic and RIGHT CVA pain, nausea without vomiting.  No fever.  History of frequent UTIs--last in 05/2009, treated at another facility--no culture available.  Takes Septra nightly as prophlyaxis.  Habits & Providers  Alcohol-Tobacco-Diet     Tobacco Status: never  Allergies: 1)  ! Pcn 2)  Amoxicillin (Amoxicillin) 3)  Codeine Phosphate (Codeine Phosphate) 4)  Morphine Sulfate (Morphine Sulfate) 5)  Naprosyn (Naproxen)  Social History: Smoking Status:  never  Physical Exam  General:  Well-developed,well-nourished,in no acute distress; alert,appropriate and cooperative throughout examination Abdomen:  Bowel sounds positive,abdomen soft and  without masses, organomegaly or hernias noted.  Mild suprapubic and RIGHT CVA tenderndess.   Impression & Recommendations:  Problem # 1:  PYELONEPHRITIS, ACUTE (ICD-590.10) Assessment New U/A compromised by Pyridium use.  Will treat based on clinical presentation and send culture  - Ciprofloxacin 500 mg by mouth two times a day x7 days  - Pyridium 200 mg by mouth tidwc x2 days Orders: Urine Culture-[facility] ([id number]) [facility]- Est Level  3 ([address]846)  Complete Medication List: 1)  Dyazide 37.5-25 Mg Caps (Triamterene-hctz) .... Take one tablet daily 2)  Imitrex 100 Mg Tabs (Sumatriptan succinate) .... Take 1 tablet by mouth as  directed 3)  Septra 400-80 Mg Tabs (Sulfamethoxazole-trimethoprim) .... Take one tablet daily at bedtime 4)  Flexeril 10 Mg Tabs (Cyclobenzaprine hcl) .... Two times a day as needed bak pain 5)  Zegerid 40-1100 Mg Caps (Omeprazole-sodium bicarbonate) .... Take once to twice daily 6)  Premarin 0.9 Mg Tabs (Estrogens conjugated) .... Take one tablet daily 7)  Aspirin 81 Mg Tbec (Aspirin) .... Take one tablet daily 8)  Caltrate 600+d 600-400 Mg-unit Tabs (Calcium carbonate-vitamin d) .... Take one tablet twice  daily 9)  Afrin Nasal Spray 0.05 % Soln (Oxymetazoline hcl) .... Daily, max 3 days 10)  Ciprofloxacin Hcl 500 Mg Tabs (Ciprofloxacin hcl) .Marland Kitchen.. 1 tab by mouth two times a day x7 days 11)  Pyridium 200 Mg Tabs (Phenazopyridine hcl) .Marland Kitchen.. 1 tab by mouth three times a day with meals x2 days  Other Orders: Urinalysis-[facility] (00000)  Patient Instructions: 1)  Pleasure to meet you today. 2)  I have sent a 7-day course of Ciprofloxacin to your pharmacy. 3)  Return to clinic if you develop fever or are still nauseated in 2 days. Prescriptions: PYRIDIUM 200 MG TABS (PHENAZOPYRIDINE HCL) 1 tab by mouth three times a day with meals x2 days  #6 x 0   Entered and Authorized by:   Romero Belling MD   Signed by:   Romero Belling MD on 09/13/2009   Method used:   Faxed to ...       [facility] (retail)  [address]       [place], [place]  [address]  [place]       Ph: [phone]       Fax: [phone]   RxID:   [id number] CIPROFLOXACIN HCL 500 MG TABS (CIPROFLOXACIN HCL) 1 tab by mouth two times a day x7 days  #14 x 0   Entered and Authorized by:   Romero Belling MD   Signed by:   Romero Belling MD on 09/13/2009   Method used:   Faxed to ...       [facility] (retail)       [address]       [place], [place]  [address]  [place]       Ph: [phone]       Fax: [phone]   RxID:   [id number]   Laboratory Results   Urine Tests  Date/Time Received: September 13, 2009 1:59 PM  Date/Time Reported: September 13, 2009 2:19 PM   Routine Urinalysis   Color: orange Appearance: Clear Spec. Gravity: 1.010   (Normal Range: 1.003-1.035) pH: 7.5   (Normal Range: 5.0-8.0)  Urine Microscopic Bacteria/HPF: 2+ Epithelial/HPF: <5 Other: 3+ amorphous    Comments: unable to read all biochemical due to medication color interaction ...............test performed by......Marland KitchenBonnie A. [place], MLS (ASCP)cm

## 2010-09-25 NOTE — Letter (Signed)
Summary: Results Follow-up Letter  [facility]  [address]   [place], [place] [address]   Phone: [phone]  Fax: [phone]    01/28/2010  [address] [place], [place]  [address]  Dear Theresa Brennan,   The following are the results of your recent test(s):  [facility] Your blood count, kidney and liver function test are basically normal.  Tests: (1) CBC NO Diff (Complete Blood Count) (10000)   Order Note: FASTING   WBC                  [H]  11.5 K/uL                   4.0-10.5   RBC                       4.57 MIL/uL                 3.87-5.11   Hemoglobin                13.2 g/dL                   57.8-46.9   Hematocrit                41.3 %                      36.0-46.0   MCV                       90.4 fL                     78.0-100.0 ! MCH                       28.9 pg                     26.0-34.0   MCHC                      32.0 g/dL                   62.9-52.8   RDW                       14.8 %                      11.5-15.5   Platelet Count       [H]  445 K/uL                    150-400  Tests: (2) Basic Metabolic Panel ([address]24)   Sodium                    137 mEq/L                   135-145   Potassium                 4.1 mEq/L                   3.5-5.3   Chloride                  96 mEq/L  96-112   CO2                       28 mEq/L                    19-32   Glucose                   84 mg/dL                    16-10   BUN                       18 mg/dL                    9-60   Creatinine                0.78 mg/dL                  0.40-1.20   Calcium                   10.5 mg/dL                  4.5-40.9 Your cholesterol, particularly the LDL (bad cholesterol) has gone up. Tests: (3) Lipid Profile (81191)   Cholesterol          [H]  261 mg/dL                   4-782     ATP III Classification:           < 200        mg/dL        Desirable          200 - 239     mg/dL        Borderline High          >= 240        mg/dL         High         Triglyceride         [H]  151 mg/dL                   <956   HDL Cholesterol           61 mg/dL                    >21   Total Chol/HDL Ratio      4.3 Ratio  VLDL Cholesterol (Calc)                             30 mg/dL                    3-08  LDL Cholesterol (Calc)                        [H]  170 mg/dL                   6-57       Your TSH is higher, which could be a temporary condition, or could indicate that your thyroid is not producing enough thyroid hormone. Please schedule a repeat test no sooner than a couple weeks and then a follow-up visit to discuss the results. Tests: (4) TSH (84696)  TSH                  [H]  6.105 uIU/mL                0.350-4.500     ***Test methodology is 3rd generation TSH***   Document Creation Date: 01/25/2010 11:22 PM Sincerely,  Zachery Dauer MD [facility]            Appended Document: Results Follow-up tests    Clinical Lists Changes  Problems: Added new problem of THYROID STIMULATING HORMONE, ABNORMAL (ICD-246.9) Orders: Added new Test order of Free T4-[facility] [phone]) - Signed Added new Test order of TSH-[facility] [phone]) - Signed      Appended Document: Results Follow-up Letter mailed letter to pt

## 2010-09-25 NOTE — Progress Notes (Signed)
Summary: phn msg re labs  Phone Note Call from Patient Call back at Home Phone [phone]   Caller: Patient Summary of Call: pt is returning call - not sure if it was Darl Pikes or Dr Sheffield Slider Initial call taken by: De Nurse,  April 02, 2010 8:55 AM  Follow-up for Phone Call        did tell her results of STD testing was negative and that Dr. Sheffield Slider will call to discuss thyroid function and follow up. Follow-up by: Luretha Murphy NP,  April 03, 2010 9:17 AM  Additional Follow-up for Phone Call Additional follow up Details #1::        Pt returning Dr. Martin Majestic call and said she could be reached on her cell phone.  [phone] Additional Follow-up by: Clydell Hakim,  April 04, 2010 8:38 AM    Additional Follow-up for Phone Call Additional follow up Details #2::    Spoke with her and advised her the tests were normal, except for the elevated TSH which should be rechecked in 6 months. Also her sugar was normal when recently checked.  Follow-up by: Zachery Dauer MD,  April 09, 2010 4:44 PM

## 2010-09-25 NOTE — Miscellaneous (Signed)
Summary: urology summary  Clinical Lists Changes  Urology note says seen in ER in Sept and prescription for Ceftin for dysuria, gross hematuria, now on suppressive Septra. Vaginal exam cystocele, no cervix. Leukocytes 25, blood neg. CT scan abdomen, pelvis ordered. Su Grand, MD

## 2010-09-25 NOTE — Assessment & Plan Note (Signed)
Summary: flu like symptoms,df   Vital Signs:  Patient profile:   65 year old female Menstrual status:  hysterectomy Height:      61 inches Weight:      164 pounds BMI:     31.10 Temp:     97.5 degrees F oral Pulse rate:   84 / minute BP sitting:   135 / 85  (right arm) Cuff size:   regular  Vitals Entered By: Tessie Fass CMA (January 25, 2010 10:10 AM)  CC: feeling tired Is Patient Diabetic? No Pain Assessment Patient in pain? no        Primary Care Provider:  Zachery Dauer MD  CC:  feeling tired.  History of Present Illness: Back and hip pain since fall 1999. Dr Darrelyn Hillock 2004 diagnosis degenerative joint disease in back and beginning in hips. left > right. Epidural injections Dr Ethelene Hal helped. The third was in 2006. Has seen Kateri Plummer past months and he injected the left hip one week ago and the pain is better, but she's had weird feelings. Myalgias, weak and without energy. then flushes and sweats like hot flashes. No uri symptoms. Lasts 10 - 20 minutes.  Nauseated but no vomiting or fever. Diarrhea first 3 days, but then stopped. blood pressure has been elevated, 151/91 yesterday, 142/85 another day. Ankle and generalized edema caused 4 lb wt gain which resolved. Stopped the Mobic and   Stopped Premarin 2 months ago. Wasn't helping hot flashes. Didn't worsen afterward.   Past 2 years has felt like she almost passes out. Happens anytime, any position except lying, while driving. Lasts a few seconds. No palpitations. Happens at least one or twice weekly.  Does fell like spinning. Has seen her ENT and had surgery.   Habits & Providers  Alcohol-Tobacco-Diet     Tobacco Status: quit  Current Medications (verified): 1)  Dyazide 37.5-25 Mg Caps (Triamterene-Hctz) .... Take One Tablet Daily 2)  Imitrex 100 Mg Tabs (Sumatriptan Succinate) .... Take 1 Tablet By Mouth As Directed 3)  Septra 400-80 Mg Tabs (Sulfamethoxazole-Trimethoprim) .... Take One Tablet Daily At Bedtime 4)   Flexeril 10 Mg  Tabs (Cyclobenzaprine Hcl) .... Two Times A Day As Needed Bak Pain 5)  Zegerid 40-1100 Mg Caps (Omeprazole-Sodium Bicarbonate) .... Take Once To Twice Daily 6)  Aspirin 81 Mg Tbec (Aspirin) .... Take One Tablet Daily 7)  Caltrate 600+d 600-400 Mg-Unit Tabs (Calcium Carbonate-Vitamin D) .... Take One Tablet Twice  Daily  Allergies: 1)  ! 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  Hearing is grossly normal bilaterally. Lungs:  Normal respiratory effort, chest expands symmetrically. Lungs are  clear to auscultation, no crackles or wheezes. Heart:  Normal rate and regular rhythm. S1 and S2 normal without gallop, murmur, click, rub or other extra sounds. Msk:  Discomfort on internal rotation of hips, but fair range of motion.  Neurologic:  alert & oriented X3.  [place] normal Psych:  normally interactive, good eye contact, not anxious appearing, and not depressed appearing, but appears worried when she discusses her symptoms    Impression & Recommendations:  Problem # 1:  FATIGUE (ICD-780.79) May relate to systemic response to the steroid  injection Orders: CBC-[facility] ([address]) TSH-[facility] ([id number])  Problem # 2:  DIZZINESS AND GIDDINESS (ICD-780.4) Long term problem. No orthostasis or apparent middle ear dysfunction. I offered cardiology visit with Dr Anne Fu for consideration of event monitoring. She wishes to defer this.  Problem # 3:  MENOPAUSE-RELATED VASOMOTOR SYMPTOMS, HOT FLASHES (ICD-627.2) She will go to the health food store for herbal products. I didn't recommend restarting the Premarin. I'll consider starting an SSRI if her symptoms don't improve as she gets away from the steroid dose.  The following medications were removed from the medication list:    Premarin 0.9 Mg Tabs (Estrogens conjugated) .Marland Kitchen... Take one tablet daily  Problem # 4:  SOMATIZATION DISORDER (ICD-300.81) May be contributing to excessive concern about symptoms.   Problem # 5:  DEGENERATIVE JOINT DISEASE, HIPS (ICD-715.95) I encouraged weight loss and non-weight bearing exercise Her updated medication list for this problem includes:    Aspirin 81 Mg Tbec (Aspirin) .Marland Kitchen... Take one tablet daily  Complete Medication List: 1)  Dyazide 37.5-25 Mg Caps (Triamterene-hctz) .... Take one tablet daily 2)  Imitrex 100 Mg Tabs (Sumatriptan succinate) .... Take 1 tablet by mouth as directed 3)  Septra 400-80 Mg Tabs (Sulfamethoxazole-trimethoprim) .... Take one tablet daily at bedtime 4)  Flexeril 10 Mg Tabs (Cyclobenzaprine hcl) .... Two times a day as needed bak pain 5)  Zegerid 40-1100 Mg Caps (Omeprazole-sodium bicarbonate) .... Take once to twice daily 6)  Aspirin 81 Mg Tbec (Aspirin) .... Take one tablet daily 7)  Caltrate 600+d 600-400 Mg-unit Tabs (Calcium carbonate-vitamin d) .... Take one tablet twice  daily  Other Orders: [facility]- Est  Level 4 ([address]1478) Basic Met-[facility] ([address]86) Lipid-[facility] ([id number])  Patient Instructions: 1)  I will call with lab results if they are abnormal  2)  Please return if your blood pressure remains elevated or you  continues to feel bad.  3)  Work on losing weight and doing non-weight bearing exercise.    Primary Care Provider:  Zachery Dauer MD  CC:  feeling tired.  History of Present Illness: Back and hip pain since fall 1999. Dr Darrelyn Hillock 2004 diagnosis degenerative joint disease in back and beginning in hips. left > right. Epidural injections Dr Ethelene Hal helped. The third was in 2006. Has seen Kateri Plummer past months and he injected the left hip one week ago and the pain is better, but she's had weird feelings. Myalgias, weak and without energy. then flushes and sweats like hot flashes. No uri symptoms. Lasts 10 - 20 minutes.  Nauseated but no vomiting or fever. Diarrhea first 3 days, but then stopped. blood pressure has been elevated, 151/91 yesterday, 142/85 another day. Ankle and generalized edema caused 4 lb wt gain which resolved. Stopped the Mobic and   Stopped Premarin 2 months ago. Wasn't helping hot flashes. Didn't worsen afterward.   Past 2 years has felt like she almost passes out. Happens anytime, any position except lying, while driving. Lasts a  few seconds. No palpitations. Happens at least one or twice weekly.  Does fell like spinning. Has seen her ENT and had surgery.      Prevention & Chronic Care Immunizations   Influenza vaccine: Fluvax MCR  (06/13/2009)   Influenza vaccine due: 04/26/2010    Tetanus booster: 12/25/2002: Done.   Tetanus booster due: 12/24/2012    Pneumococcal vaccine: Not documented    H. zoster vaccine: Not documented  Colorectal Screening   Hemoccult: Done.  (01/24/2002)   Hemoccult due: Not Indicated    Colonoscopy: Done.  (08/26/2005)   Colonoscopy due: 08/27/2015  Other Screening   Pap smear: Not documented   Pap smear due: Not Indicated    Mammogram: abnormal  (01/03/2009)   Mammogram due: 01/03/2010    DXA bone density scan: Done.  (10/25/2002)   DXA scan due: None    Smoking status: quit  (01/25/2010)  Lipids   Total Cholesterol: 223   (06/06/2008)   LDL: 141  (06/06/2008)   LDL Direct: 158  (06/13/2009)   HDL: 54  (06/06/2008)   Triglycerides: 140  (06/06/2008)    SGOT (AST): 24  (06/13/2009)   SGPT (ALT): 20  (06/13/2009)   Alkaline phosphatase: 67  (06/13/2009)   Total bilirubin: 0.3  (06/13/2009)  Hypertension   Last Blood Pressure: 135 / 85  (01/25/2010)   Serum creatinine: 0.67  (06/13/2009)   Serum potassium 3.4  (06/13/2009)    Hypertension flowsheet reviewed?: Yes   Progress toward BP goal: At goal  Self-Management Support :   Personal Goals (by the next clinic visit) :      Personal blood pressure goal: 140/90  (06/13/2009)     Personal LDL goal: 130  (06/13/2009)    Hypertension self-management support: Not documented    Lipid self-management support: Not documented     Social History:    Divorced from alcoholic first husband,     Widowed 11/97; Married SA Buboltz 5/01    Son, Kenyon Ana in [place], Perlie Gold in [place]    Social alcohol    Former Smoker, quit 1984    Regular exercise-yes    Working at in home care [employer]    Smoking Status:  quit
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
## 2010-09-25 NOTE — Miscellaneous (Signed)
Summary: Nl cystoscopy 10/31  Clinical Lists Changes  Medications: Removed medication of SEPTRA 400-80 MG TABS (SULFAMETHOXAZOLE-TRIMETHOPRIM) Take one tablet daily at bedtime Added new medication of TRIMETHOPRIM 100 MG TABS (TRIMETHOPRIM) Take one tablet daily at bedtime Normal cystoscopy by Dr Brunilda Payor. Trimethoprim prophlaxis

## 2010-09-27 NOTE — Miscellaneous (Signed)
Summary: Problem review   

## 2010-09-27 NOTE — Miscellaneous (Signed)
Summary: Triam/hctz refill  Prescriptions: DYAZIDE 37.5-25 MG CAPS (TRIAMTERENE-HCTZ) Take one tablet daily  #30 Each x 3   Entered and Authorized by:   Zachery Dauer MD   Signed by:   Zachery Dauer MD on 08/03/2010   Method used:   Electronically to        [facility]* (retail)       [address]       [place], [place]  [address]       Ph: [phone]       Fax: [phone]   RxID:   [id number]

## 2010-11-06 ENCOUNTER — Other Ambulatory Visit: Payer: Self-pay | Admitting: Family Medicine

## 2010-11-06 MED ORDER — TRAMADOL HCL 50 MG PO TABS: 50.0000 mg | ORAL_TABLET | Freq: Four times a day (QID) | ORAL | Status: DC | PRN

## 2010-11-08 LAB — URINALYSIS, ROUTINE W REFLEX MICROSCOPIC
Glucose, UA: NEGATIVE mg/dL
Ketones, ur: 40 mg/dL — AB
Nitrite: POSITIVE — AB
Protein, ur: >300 mg/dL — AB
pH: 7 (ref 5.0–8.0)

## 2010-11-08 LAB — URINE MICROSCOPIC-ADD ON

## 2010-11-08 LAB — BASIC METABOLIC PANEL
BUN: 12 mg/dL (ref 6–23)
CO2: 30 mEq/L (ref 19–32)
Calcium: 9.8 mg/dL (ref 8.4–10.5)
Chloride: 100 mEq/L (ref 96–112)
Creatinine, Ser: 0.8 mg/dL (ref 0.4–1.2)

## 2010-11-08 LAB — CBC
MCH: 30.5 pg (ref 26.0–34.0)
MCHC: 34.3 g/dL (ref 30.0–36.0)
MCV: 89 fL (ref 78.0–100.0)
Platelets: 413 10*3/uL — ABNORMAL HIGH (ref 150–400)
RDW: 13.6 % (ref 11.5–15.5)

## 2010-11-08 LAB — DIFFERENTIAL
Basophils Absolute: 0 10*3/uL (ref 0.0–0.1)
Basophils Relative: 0 % (ref 0–1)
Eosinophils Absolute: 0.1 10*3/uL (ref 0.0–0.7)
Eosinophils Relative: 1 % (ref 0–5)
Neutrophils Relative %: 75 % (ref 43–77)

## 2010-11-08 LAB — URINE CULTURE
Colony Count: 10000
Culture  Setup Time: 201109191759

## 2010-12-24 ENCOUNTER — Other Ambulatory Visit: Payer: Self-pay | Admitting: Family Medicine

## 2010-12-24 NOTE — Telephone Encounter (Signed)
Refill request

## 2010-12-25 ENCOUNTER — Telehealth: Payer: Self-pay | Admitting: Family Medicine

## 2010-12-25 NOTE — Telephone Encounter (Signed)
Faxed form back to pharmacy that she hasn't visited since the last fax was returned on which I stated that she needs a follow up visit.

## 2010-12-25 NOTE — Telephone Encounter (Signed)
Pt asking to speak with MD re: meds being denied, has a very strict work schedule & is only available on Fridays & MD is not here on those days. Pt only has enough meds for 1 more day.

## 2010-12-25 NOTE — Telephone Encounter (Signed)
Forward to [facility] 

## 2010-12-26 ENCOUNTER — Telehealth: Payer: Self-pay | Admitting: Family Medicine

## 2010-12-26 ENCOUNTER — Other Ambulatory Visit: Payer: Self-pay | Admitting: Family Medicine

## 2010-12-26 MED ORDER — CYCLOBENZAPRINE HCL 10 MG PO TABS: 10.0000 mg | ORAL_TABLET | Freq: Two times a day (BID) | ORAL | Status: DC | PRN

## 2010-12-26 MED ORDER — TRAMADOL HCL 50 MG PO TABS: 50.0000 mg | ORAL_TABLET | Freq: Four times a day (QID) | ORAL | Status: DC | PRN

## 2010-12-26 MED ORDER — TRIAMTERENE-HCTZ 37.5-25 MG PO CAPS: 1.0000 | ORAL_CAPSULE | Freq: Every day | ORAL | Status: DC

## 2010-12-26 NOTE — Telephone Encounter (Signed)
I sent in the 3 prescriptions for the next month. I would be happy to forward her information to a physician who is more convenient for her.

## 2010-12-26 NOTE — Telephone Encounter (Signed)
Pt is requesting refills on meds she previously requested but was denied, until she comes in for her visit on 5/15.  Informed that rxs may still not be refilled.  Pt is in need of Flexeril,Triamterene-hctz and tramadol.  Would like these to be sent to [facility] in [place].  Told patient she may have to come in for visit first.

## 2011-01-01 ENCOUNTER — Telehealth: Payer: Self-pay | Admitting: *Deleted

## 2011-01-01 NOTE — Telephone Encounter (Signed)
Called patient, she said that Dr Sheffield Slider has already filled her meds.[name], Theresa Brennan

## 2011-01-01 NOTE — Telephone Encounter (Signed)
Message copied by Tessie Fass on Tue Jan 01, 2011 10:02 AM ------      Message from: Zachery Dauer      Created: Tue Dec 25, 2010  9:54 PM      Regarding: refills       Please find out exactly what medications that she needs refilled. I think it's Tramadol, Dyazide and Imitriptan, but can't see the requests anymore. She needs potassium and other labs, but I'd prefer that she establish herself with someone else.

## 2011-01-08 ENCOUNTER — Encounter: Payer: Self-pay | Admitting: Family Medicine

## 2011-01-08 ENCOUNTER — Ambulatory Visit (INDEPENDENT_AMBULATORY_CARE_PROVIDER_SITE_OTHER): Payer: Medicare Other | Admitting: Family Medicine

## 2011-01-08 VITALS — BP 130/80 | HR 90 | Ht 60.0 in | Wt 168.0 lb

## 2011-01-08 DIAGNOSIS — Z23 Encounter for immunization: Secondary | ICD-10-CM

## 2011-01-08 DIAGNOSIS — K589 Irritable bowel syndrome without diarrhea: Secondary | ICD-10-CM

## 2011-01-08 DIAGNOSIS — K219 Gastro-esophageal reflux disease without esophagitis: Secondary | ICD-10-CM

## 2011-01-08 DIAGNOSIS — G43009 Migraine without aura, not intractable, without status migrainosus: Secondary | ICD-10-CM

## 2011-01-08 DIAGNOSIS — N3020 Other chronic cystitis without hematuria: Secondary | ICD-10-CM

## 2011-01-08 DIAGNOSIS — K273 Acute peptic ulcer, site unspecified, without hemorrhage or perforation: Secondary | ICD-10-CM

## 2011-01-08 DIAGNOSIS — F450 Somatization disorder: Secondary | ICD-10-CM

## 2011-01-08 DIAGNOSIS — I10 Essential (primary) hypertension: Secondary | ICD-10-CM

## 2011-01-08 LAB — COMPREHENSIVE METABOLIC PANEL
AST: 23 U/L (ref 0–37)
Alkaline Phosphatase: 86 U/L (ref 39–117)
BUN: 12 mg/dL (ref 6–23)
Calcium: 9.7 mg/dL (ref 8.4–10.5)
Creat: 0.79 mg/dL (ref 0.40–1.20)
Total Bilirubin: 0.4 mg/dL (ref 0.3–1.2)

## 2011-01-08 MED ORDER — PNEUMOCOCCAL VAC POLYVALENT 25 MCG/0.5ML IJ INJ
0.5000 mL | INJECTION | Freq: Once | INTRAMUSCULAR | Status: AC
  Administered 2011-01-08: 0.5 mL via INTRAMUSCULAR

## 2011-01-08 NOTE — Telephone Encounter (Signed)
According to med list, meds were refilled on 5/2, closing encounter.

## 2011-01-08 NOTE — Progress Notes (Signed)
  Subjective:    Patient ID: Theresa Brennan, female    DOB: [birth year], 65 y.o.   MRN: [id number]  HPI She is here for review of her medications and for monitoring blood tests Despite continuing on the trimethoprim suppression she developed a bladder infection with hematuria 2 weeks ago. She was treated  at an urgent care center in [facility] with an injection daily for 7 days. She is  Scheduled for followup with urologist in [facility]. Shee has nocturia 1-2 times nightly, and does have a very mild urge incontinence  Her new gynecologist in [facility],  Dr. Madaline Brilliant has scheduled  a mammogram to be done soon. He did start her on estradiol 0.5 mg tablets one daily  She also has a new gastroenterologist Dr. Norma Fredrickson in [facility] who has her scheduled for colonoscopy, the last being in 2009.  She's had recent episodes of sinusitis causing migraine headaches for which she takes the Imitrex  Her husband is currently out of work with a work with an injury to his left shoulder that required surgery. She is currently the only wage earner.   Review of Systems  Constitutional: Negative for activity change.  HENT: Positive for hearing loss (right ear due to repeated mastoidectomy) and congestion (in her sinuses that leads to headaches).   Respiratory: Negative for shortness of breath.   Genitourinary: Negative for dysuria (since the series of injections for UTI) and hematuria (resolved after the UTI treatment).  Musculoskeletal: Positive for back pain (responds well to the Tramadol).  Psychiatric/Behavioral: Negative for dysphoric mood.   No chest pain    Objective:   Physical Exam Pleasant well-appearing mildly obese Neck thyroid normal Chest clear Heart regular rhythm without murmur Ankles trace edema       Assessment & Plan:

## 2011-01-08 NOTE — Patient Instructions (Addendum)
Schedule well visit with Theresa Brennan  Recheck with Dr Sheffield Slider in 1 year, sooner if headache

## 2011-01-09 ENCOUNTER — Encounter: Payer: Self-pay | Admitting: Family Medicine

## 2011-01-09 NOTE — Assessment & Plan Note (Signed)
Currently controlled. Has a new gastroenterologist

## 2011-01-09 NOTE — Assessment & Plan Note (Signed)
Historically she has visited multiple specialists for her various complaints and now is switching to new ones in the [facility] area who follow the pattern of looking anew causes of her symptoms.

## 2011-01-09 NOTE — Assessment & Plan Note (Signed)
Colonoscopy planned by Dr Norma Fredrickson

## 2011-01-09 NOTE — Assessment & Plan Note (Signed)
Recurrent that she relates to sinus problems. No indications of problems from her recurrent Imitrex use

## 2011-01-09 NOTE — Assessment & Plan Note (Signed)
well controlled  

## 2011-01-11 NOTE — Op Note (Signed)
NAME:  Theresa Brennan, Theresa Brennan                            ACCOUNT NO.:  [id number]   MEDICAL RECORD NO.:  [id number]                   PATIENT TYPE:  AMB   LOCATION:  ENDO                                 FACILITY:  [facility]   PHYSICIAN:  Petra Kuba, M.D.                 DATE OF BIRTH:  12-15-1945   DATE OF PROCEDURE:  05/28/2002  DATE OF DISCHARGE:                                 OPERATIVE REPORT   PROCEDURE:  EGD with biopsy.   INDICATION:  The patient with history of antral ulcers, difficult to heal,  with recurrent GI upper symptoms.  Consent was signed after risks, benefits,  methods, options thoroughly discussed in the office.   MEDICINES USED:  Demerol 50, Versed 6.   DESCRIPTION OF PROCEDURE:  The video endoscope was inserted by direct  vision.  The proximal and mid esophagus were normal.  In the distal  esophagus was a small hiatal hernia.  There were no signs of Barrett's or  significant esophagitis.  The scope passed into the stomach, advanced to the  antrum.  Scars from healed previous ulcers were seen, but there was one  small shallow antral ulcer seen which was cold biopsied at the end of the  procedure.  The scope passed through a normal pylorus, into a normal  duodenal bulb, and around the C-loop to a normal second portion of the  duodenum.  The scope was withdrawn back to the bulb, and a good look there  ruled out ulcers in that location.  The scope was withdrawn back to the  stomach, and retroflexed.  Angularis, fundus, lesser and greater curve were  normal on retroflex visualization.  High in the cardia, the hiatal hernia  was confirmed.  The scope was straightened, and straight visualization of  stomach ruled out any other additional findings.  Biopsies of the antrum  were obtained at this time and one of the proximal stomach as well was  obtained to rule out Helicobacter.  Air was suctioned and the scope slowly  withdrawn.  Again, a good look at the esophagus confirmed  above normal  findings.  The scope was removed.  The patient tolerated the procedure well.  There was no obvious immediate complication.   ENDOSCOPIC DIAGNOSES:  1. Small hiatal hernia.  2. Antral small ulcer, status post biopsied.  3. Scars from previous antral ulcers seen.  4. Otherwise within normal limits EGD, including a proximal stomach biopsy     to rule out Helicobacter.   PLAN:  1. Increase pump inhibitors to b.i.d..  2.     Decrease aspirin and nonsteriodals.  3. Await pathology.  4. Follow-up p.r.n. or in two months to recheck symptoms and make sure no     further work-up plans are needed.  Petra Kuba, M.D.    MEM/MEDQ  D:  05/28/2002  T:  05/29/2002  Job:  [id number]   cc:   Deniece Portela A. Sheffield Slider, M.D.  [address] [place]  [place] [address]  Fax: [phone]

## 2011-01-24 ENCOUNTER — Other Ambulatory Visit: Payer: Self-pay | Admitting: Family Medicine

## 2011-01-24 NOTE — Telephone Encounter (Signed)
Refill request

## 2011-01-29 ENCOUNTER — Ambulatory Visit: Payer: Medicare Other | Admitting: Home Health Services

## 2011-02-08 ENCOUNTER — Other Ambulatory Visit: Payer: Self-pay | Admitting: Family Medicine

## 2011-02-08 DIAGNOSIS — M169 Osteoarthritis of hip, unspecified: Secondary | ICD-10-CM

## 2011-02-08 NOTE — Telephone Encounter (Signed)
Refill request

## 2011-03-31 ENCOUNTER — Encounter (HOSPITAL_BASED_OUTPATIENT_CLINIC_OR_DEPARTMENT_OTHER): Payer: Self-pay | Admitting: Emergency Medicine

## 2011-03-31 ENCOUNTER — Emergency Department (HOSPITAL_BASED_OUTPATIENT_CLINIC_OR_DEPARTMENT_OTHER)
Admission: EM | Admit: 2011-03-31 | Discharge: 2011-04-01 | Disposition: A | Discharge status: Home / Self Care | Payer: Medicare Other | Attending: Emergency Medicine | Admitting: Emergency Medicine

## 2011-03-31 DIAGNOSIS — N390 Urinary tract infection, site not specified: Secondary | ICD-10-CM

## 2011-03-31 DIAGNOSIS — R300 Dysuria: Secondary | ICD-10-CM | POA: Insufficient documentation

## 2011-03-31 LAB — URINALYSIS, ROUTINE W REFLEX MICROSCOPIC
Bilirubin Urine: NEGATIVE
Nitrite: NEGATIVE
Specific Gravity, Urine: 1.019 (ref 1.005–1.030)
pH: 6 (ref 5.0–8.0)

## 2011-03-31 LAB — URINE MICROSCOPIC-ADD ON

## 2011-03-31 MED ORDER — DEXTROSE 5 % IV SOLN
INTRAVENOUS | Status: AC
  Filled 2011-03-31: qty 1

## 2011-03-31 MED ORDER — LIDOCAINE HCL (PF) 1 % IJ SOLN
INTRAMUSCULAR | Status: AC
  Administered 2011-03-31: 2.1 mL via INTRAMUSCULAR
  Filled 2011-03-31: qty 5

## 2011-03-31 MED ORDER — CEFTRIAXONE SODIUM 1 G IJ SOLR
1.0000 g | Freq: Once | INTRAMUSCULAR | Status: AC
  Administered 2011-03-31: 1 g via INTRAMUSCULAR
  Filled 2011-03-31: qty 1

## 2011-03-31 MED ORDER — OXYCODONE-ACETAMINOPHEN 5-325 MG PO TABS: 2.0000 | ORAL_TABLET | ORAL | Status: AC | PRN

## 2011-03-31 MED ORDER — OXYCODONE-ACETAMINOPHEN 5-325 MG PO TABS
1.0000 | ORAL_TABLET | Freq: Once | ORAL | Status: AC
  Administered 2011-03-31: 1 via ORAL
  Filled 2011-03-31: qty 1
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
## 2011-03-31 NOTE — ED Notes (Signed)
Pt reports UTI symptoms x 24hrs with flank pain

## 2011-04-01 LAB — URINE CULTURE

## 2011-04-01 NOTE — ED Notes (Signed)
Pt  DC Home care plan reviewed

## 2011-06-27 HISTORY — PX: CYSTOSCOPY: SUR368

## 2011-07-12 ENCOUNTER — Other Ambulatory Visit: Payer: Self-pay | Admitting: Family Medicine

## 2011-07-12 NOTE — Telephone Encounter (Signed)
Pt calling about her imitrex, just realized the prescription expired 11/4, wants to know if Dr. Sheffield Slider can call in a new rx asap, goes to [facility]/[place] [phone]

## 2011-07-12 NOTE — Telephone Encounter (Signed)
Fwd. To Dr.Hale .Leanthony Rhett  

## 2011-07-13 ENCOUNTER — Other Ambulatory Visit: Payer: Self-pay | Admitting: Family Medicine

## 2011-07-13 DIAGNOSIS — G43009 Migraine without aura, not intractable, without status migrainosus: Secondary | ICD-10-CM

## 2011-07-13 MED ORDER — SUMATRIPTAN SUCCINATE 100 MG PO TABS: 100.0000 mg | ORAL_TABLET | ORAL | Status: DC

## 2011-08-10 ENCOUNTER — Encounter (HOSPITAL_BASED_OUTPATIENT_CLINIC_OR_DEPARTMENT_OTHER): Payer: Self-pay | Admitting: *Deleted

## 2011-08-10 ENCOUNTER — Emergency Department (HOSPITAL_BASED_OUTPATIENT_CLINIC_OR_DEPARTMENT_OTHER)
Admission: EM | Admit: 2011-08-10 | Discharge: 2011-08-10 | Disposition: A | Discharge status: Home / Self Care | Payer: Medicare Other | Attending: Emergency Medicine | Admitting: Emergency Medicine

## 2011-08-10 DIAGNOSIS — N390 Urinary tract infection, site not specified: Secondary | ICD-10-CM | POA: Insufficient documentation

## 2011-08-10 DIAGNOSIS — E876 Hypokalemia: Secondary | ICD-10-CM | POA: Insufficient documentation

## 2011-08-10 DIAGNOSIS — R319 Hematuria, unspecified: Secondary | ICD-10-CM | POA: Insufficient documentation

## 2011-08-10 LAB — DIFFERENTIAL
Basophils Absolute: 0 K/uL (ref 0.0–0.1)
Basophils Relative: 0 % (ref 0–1)
Eosinophils Absolute: 0.1 K/uL (ref 0.0–0.7)
Eosinophils Relative: 1 % (ref 0–5)
Lymphocytes Relative: 33 % (ref 12–46)
Lymphs Abs: 3 K/uL (ref 0.7–4.0)
Monocytes Absolute: 0.5 K/uL (ref 0.1–1.0)
Monocytes Relative: 6 % (ref 3–12)
Neutro Abs: 5.4 K/uL (ref 1.7–7.7)
Neutrophils Relative %: 60 % (ref 43–77)

## 2011-08-10 LAB — URINALYSIS, ROUTINE W REFLEX MICROSCOPIC
Glucose, UA: NEGATIVE mg/dL
Ketones, ur: 15 mg/dL — AB
Nitrite: POSITIVE — AB
Protein, ur: 100 mg/dL — AB
Specific Gravity, Urine: 1.022 (ref 1.005–1.030)
Urobilinogen, UA: 2 mg/dL — ABNORMAL HIGH (ref 0.0–1.0)
pH: 5.5 (ref 5.0–8.0)

## 2011-08-10 LAB — CBC
MCH: 28.9 pg (ref 26.0–34.0)
MCV: 86.4 fL (ref 78.0–100.0)
Platelets: 376 10*3/uL (ref 150–400)
RDW: 13.7 % (ref 11.5–15.5)
WBC: 9 10*3/uL (ref 4.0–10.5)

## 2011-08-10 LAB — BASIC METABOLIC PANEL
Calcium: 9.8 mg/dL (ref 8.4–10.5)
GFR calc non Af Amer: 76 mL/min — ABNORMAL LOW (ref >90)
Sodium: 139 mEq/L (ref 135–145)

## 2011-08-10 LAB — URINE MICROSCOPIC-ADD ON

## 2011-08-10 MED ORDER — DOXYCYCLINE HYCLATE 100 MG PO CAPS: 100.0000 mg | ORAL_CAPSULE | Freq: Two times a day (BID) | ORAL | Status: DC

## 2011-08-10 MED ORDER — DOXYCYCLINE HYCLATE 100 MG PO CAPS: 100.0000 mg | ORAL_CAPSULE | Freq: Two times a day (BID) | ORAL | Status: AC

## 2011-08-10 MED ORDER — TRAMADOL HCL 50 MG PO TABS: 50.0000 mg | ORAL_TABLET | Freq: Four times a day (QID) | ORAL | Status: DC | PRN

## 2011-08-10 MED ORDER — TRAMADOL HCL 50 MG PO TABS
50.0000 mg | ORAL_TABLET | Freq: Once | ORAL | Status: AC
  Administered 2011-08-10: 50 mg via ORAL
  Filled 2011-08-10: qty 1

## 2011-08-10 MED ORDER — POTASSIUM CHLORIDE CRYS ER 20 MEQ PO TBCR
40.0000 meq | EXTENDED_RELEASE_TABLET | Freq: Once | ORAL | Status: AC
  Administered 2011-08-10: 40 meq via ORAL
  Filled 2011-08-10: qty 2

## 2011-08-10 MED ORDER — DOXYCYCLINE HYCLATE 100 MG PO TABS
100.0000 mg | ORAL_TABLET | Freq: Once | ORAL | Status: AC
  Administered 2011-08-10: 100 mg via ORAL
  Filled 2011-08-10: qty 1
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
## 2011-08-10 NOTE — ED Notes (Signed)
Pt states she has a hx of bladder infections and she feels that is what she has.

## 2011-08-12 LAB — URINE CULTURE

## 2011-08-13 NOTE — ED Notes (Signed)
+   Urine Patient treated with Doxycyline-chart sent to EDP office for review.

## 2011-08-15 NOTE — ED Notes (Signed)
15,000 not sufficient enough to treat  Per [facility].

## 2011-08-23 ENCOUNTER — Encounter: Payer: Self-pay | Admitting: Family Medicine

## 2011-08-27 HISTORY — PX: MIDDLE EAR SURGERY: SHX713

## 2011-08-29 DIAGNOSIS — N3010 Interstitial cystitis (chronic) without hematuria: Secondary | ICD-10-CM | POA: Diagnosis not present

## 2011-08-29 DIAGNOSIS — N390 Urinary tract infection, site not specified: Secondary | ICD-10-CM | POA: Diagnosis not present

## 2011-08-29 DIAGNOSIS — R350 Frequency of micturition: Secondary | ICD-10-CM | POA: Diagnosis not present

## 2011-08-29 DIAGNOSIS — R310 Gross hematuria: Secondary | ICD-10-CM | POA: Diagnosis not present

## 2011-09-28 ENCOUNTER — Encounter (HOSPITAL_BASED_OUTPATIENT_CLINIC_OR_DEPARTMENT_OTHER): Payer: Self-pay | Admitting: *Deleted

## 2011-09-28 ENCOUNTER — Emergency Department (HOSPITAL_BASED_OUTPATIENT_CLINIC_OR_DEPARTMENT_OTHER)
Admission: EM | Admit: 2011-09-28 | Discharge: 2011-09-28 | Disposition: A | Discharge status: Home / Self Care | Payer: Medicare Other | Attending: Emergency Medicine | Admitting: Emergency Medicine

## 2011-09-28 DIAGNOSIS — R110 Nausea: Secondary | ICD-10-CM | POA: Diagnosis not present

## 2011-09-28 DIAGNOSIS — Z79899 Other long term (current) drug therapy: Secondary | ICD-10-CM | POA: Diagnosis not present

## 2011-09-28 DIAGNOSIS — N3090 Cystitis, unspecified without hematuria: Secondary | ICD-10-CM | POA: Diagnosis not present

## 2011-09-28 LAB — URINALYSIS, MICROSCOPIC ONLY
Protein, ur: 30 mg/dL — AB
Urobilinogen, UA: 0.2 mg/dL (ref 0.0–1.0)

## 2011-09-28 MED ORDER — CEFDINIR 300 MG PO CAPS: 300.0000 mg | ORAL_CAPSULE | Freq: Two times a day (BID) | ORAL | Status: AC
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
## 2011-09-28 NOTE — ED Notes (Addendum)
Pt has a hx of bladder infections and feels that this is one. States she has taken "the blue pill" and has been passing blood for about an hour.

## 2011-09-30 LAB — URINE CULTURE
Colony Count: 1000
Culture  Setup Time: 201302030017

## 2011-10-02 ENCOUNTER — Other Ambulatory Visit: Payer: Self-pay | Admitting: Family Medicine

## 2011-10-02 MED ORDER — TRAMADOL HCL 50 MG PO TABS: 50.0000 mg | ORAL_TABLET | Freq: Four times a day (QID) | ORAL | Status: DC | PRN

## 2011-12-10 ENCOUNTER — Encounter: Payer: Self-pay | Admitting: Family Medicine

## 2011-12-10 ENCOUNTER — Ambulatory Visit (INDEPENDENT_AMBULATORY_CARE_PROVIDER_SITE_OTHER): Payer: Medicare Other | Admitting: Family Medicine

## 2011-12-10 VITALS — BP 138/77 | HR 90 | Temp 97.6°F | Ht 60.0 in | Wt 159.0 lb

## 2011-12-10 DIAGNOSIS — G43009 Migraine without aura, not intractable, without status migrainosus: Secondary | ICD-10-CM

## 2011-12-10 DIAGNOSIS — M479 Spondylosis, unspecified: Secondary | ICD-10-CM | POA: Diagnosis not present

## 2011-12-10 DIAGNOSIS — J069 Acute upper respiratory infection, unspecified: Secondary | ICD-10-CM | POA: Diagnosis not present

## 2011-12-10 DIAGNOSIS — N3020 Other chronic cystitis without hematuria: Secondary | ICD-10-CM | POA: Diagnosis not present

## 2011-12-10 NOTE — Assessment & Plan Note (Signed)
No sign of sinus infection at this point. Will try to prevent it with 3 days of Afring

## 2011-12-10 NOTE — Assessment & Plan Note (Signed)
Interstitial cystitis per the patient. She will see her urologist tomorrow. No current UTI symptoms. He has her take Cefdinedir when she does.

## 2011-12-10 NOTE — Assessment & Plan Note (Signed)
Cervical radiculopathy and/or carpal tunnel syndrome

## 2011-12-10 NOTE — Progress Notes (Signed)
  Subjective:    Patient ID: Theresa Brennan, female    DOB: 1946/07/22, 66 y.o.   MRN: [id number]  HPI 4 days of bilateral ear pain and popping, Slight sore throat. Post nasal drip. 2 days ago felt chilly. Nausea with no diarrhea or abdominal pain. Has missed two days of work.   Recently her migraine headache have seemed to relate to her neck and she would like referral to a [facility] neurologist, Lirim Tonuzi in [place]. She gets numbness on the first 4 fingers on the left hand while driving and has to shake it. Also gets neck stiffness and hurt on the left when she looks up and rotates her neck. Dr Marrianne Mood did surgery to cut C2 bilaterally and she has left occipital scalp numbness ever since.   Husband was let go when he returned to work after being disabled by a shoulder injury at work. He's 62.   Her new urologist, Dr Lindley Magnus, has her on new medications for interstitial cystitis.   Review of Systems     Objective:   Physical Exam  HENT:  Head: Normocephalic.  Left Ear: External ear normal.  Mouth/Throat: Oropharynx is clear and moist. No oropharyngeal exudate.       R TM is grey but thickened with scar. Canal is dry  Eyes: Conjunctivae are normal.  Cardiovascular: Normal rate and regular rhythm.   Pulmonary/Chest: Effort normal and breath sounds normal. No respiratory distress. She has no wheezes.  Abdominal: Soft. Bowel sounds are normal. She exhibits no distension. There is no tenderness. There is no rebound and no guarding.  Musculoskeletal:       Midline upper cervical old surgical scar  Lymphadenopathy:    She has no cervical adenopathy.  Psychiatric: She has a normal mood and affect.          Assessment & Plan:

## 2011-12-10 NOTE — Patient Instructions (Signed)
Use the Afrin nasal spray twice daily for 3 days If you have sinus infection symptoms in 3 days call Dr Sheffield Slider Friday AM  If your cervical spine shows abnormality I will contact you  Cervical Radiculopathy Cervical radiculopathy happens when a nerve in the neck is pinched or bruised by a slipped (herniated) disk or by arthritic changes in the bones of the cervical spine. This can occur due to an injury or as part of the normal aging process. Pressure on the cervical nerves can cause pain or numbness that runs from your neck all the way down into your arm and fingers. CAUSES  There are many possible causes, including:  Injury.   Muscle tightness in the neck from overuse.   Swollen, painful joints (arthritis).   Breakdown or degeneration in the bones and joints of the spine (spondylosis) due to aging.   Bone spurs that may develop near the cervical nerves.  SYMPTOMS  Symptoms include pain, weakness, or numbness in the affected arm and hand. Pain can be severe or irritating. Symptoms may be worse when extending or turning the neck. DIAGNOSIS  Your caregiver will ask about your symptoms and do a physical exam. He or she may test your strength and reflexes. X-rays, CT scans, and MRI scans may be needed in cases of injury or if the symptoms do not go away after a period of time. Electromyography (EMG) or nerve conduction testing may be done to study how your nerves and muscles are working. TREATMENT  Your caregiver may recommend certain exercises to help relieve your symptoms. Cervical radiculopathy can, and often does, get better with time and treatment. If your problems continue, treatment options may include:  Wearing a soft collar for short periods of time.   Physical therapy to strengthen the neck muscles.   Medicines, such as nonsteroidal anti-inflammatory drugs (NSAIDs), oral corticosteroids, or spinal injections.   Surgery. Different types of surgery may be done depending on the cause  of your problems.  HOME CARE INSTRUCTIONS   Put ice on the affected area.   Put ice in a plastic bag.   Place a towel between your skin and the bag.   Leave the ice on for 15 to 20 minutes, 3 to 4 times a day or as directed by your caregiver.   Use a flat pillow when you sleep.   Only take over-the-counter or prescription medicines for pain, discomfort, or fever as directed by your caregiver.   If physical therapy was prescribed, follow your caregiver's directions.   If a soft collar was prescribed, use it as directed.  SEEK IMMEDIATE MEDICAL CARE IF:   Your pain gets much worse and cannot be controlled with medicines.   You have weakness or numbness in your hand, arm, face, or leg.   You have a high fever or a stiff, rigid neck.   You lose bowel or bladder control (incontinence).   You have trouble with walking, balance, or speaking.  MAKE SURE YOU:   Understand these instructions.   Will watch your condition.   Will get help right away if you are not doing well or get worse.  Document Released: 05/07/2001 Document Revised: 08/01/2011 Document Reviewed: 03/26/2011 [facility] Patient Information 2012 [place], [place].   Carpal Tunnel Syndrome The carpal tunnel is a narrow hollow area in the wrist. It is formed by the wrist bones and ligaments. Nerves, blood vessels, and tendons (cord like structures which attach muscle to bone) on the palm side (the side  of your hand in the direction your fingers bend) of your hand pass through the carpal tunnel. Repeated wrist motion or certain diseases may cause swelling within the tunnel. (That is why these are called repetitive trauma (damage caused by over use) disorders. It is also a common problem in late pregnancy.) This swelling pinches the main nerve in the wrist (median nerve) and causes the painful condition called carpal tunnel syndrome. A feeling of "pins and needles" may be noticed in the fingers or hand; however, the entire arm  may ache from this condition. Carpal tunnel syndrome may clear up by itself. Cortisone injections may help. Sometimes, an operation may be needed to free the pinched nerve. An electromyogram (a type of test) may be needed to confirm this diagnosis (learning what is wrong). This is a test which measures nerve conduction. The nerve conduction is usually slowed in a carpal tunnel syndrome. HOME CARE INSTRUCTIONS   If your caregiver prescribed medication to help reduce swelling, take as directed.   If you were given a splint to keep your wrist from bending, use it as instructed. It is important to wear the splint at night. Use the splint for as long as you have pain or numbness in your hand, arm or wrist. This may take 1 to 2 months.   If you have pain at night, it may help to rub or shake your hand, or elevate your hand above the level of your heart (the center of your chest).   It is important to give your wrist a rest by stopping the activities that are causing the problem. If your symptoms (problems) are work-related, you may need to talk to your employer about changing to a job that does not require using your wrist.   Only take over-the-counter or prescription medicines for pain, discomfort, or fever as directed by your caregiver.   Following periods of extended use, particularly strenuous use, apply an ice pack wrapped in a towel to the anterior (palm) side of the affected wrist for 20 to 30 minutes. Repeat as needed three to four times per day. This will help reduce the swelling.   Follow all instructions for follow-up with your caregiver. This includes any orthopedic referrals, physical therapy, and rehabilitation. Any delay in obtaining necessary care could result in a delay or failure of your condition to heal.  SEEK IMMEDIATE MEDICAL CARE IF:   You are still having pain and numbness following a week of treatment.   You develop new, unexplained symptoms.   Your current symptoms are  getting worse and are not helped or controlled with medications.  MAKE SURE YOU:   Understand these instructions.   Will watch your condition.   Will get help right away if you are not doing well or get worse.  Document Released: 08/09/2000 Document Revised: 08/01/2011 Document Reviewed: 06/28/2011 [facility] Patient Information 2012 [place], [place].

## 2011-12-10 NOTE — Assessment & Plan Note (Signed)
She has frequent episodes.

## 2011-12-11 DIAGNOSIS — N3010 Interstitial cystitis (chronic) without hematuria: Secondary | ICD-10-CM | POA: Diagnosis not present

## 2011-12-16 ENCOUNTER — Telehealth: Payer: Self-pay | Admitting: Family Medicine

## 2011-12-16 NOTE — Telephone Encounter (Signed)
Called pt.

## 2011-12-16 NOTE — Telephone Encounter (Signed)
Pt was here last week and he wanted her to go get xrays (wrote it on a script) she is not sure where she supposed to go for her Jillyn Hidden - he told her somewhere on [address] in [place] but is not sure where to go.  pls advise

## 2012-01-01 ENCOUNTER — Other Ambulatory Visit: Payer: Self-pay | Admitting: Family Medicine

## 2012-01-01 ENCOUNTER — Ambulatory Visit
Admission: RE | Admit: 2012-01-01 | Discharge: 2012-01-01 | Disposition: A | Discharge status: Home / Self Care | Payer: Medicare Other | Source: Ambulatory Visit | Attending: Family Medicine | Admitting: Family Medicine

## 2012-01-01 DIAGNOSIS — M79609 Pain in unspecified limb: Secondary | ICD-10-CM | POA: Diagnosis not present

## 2012-01-01 DIAGNOSIS — M47812 Spondylosis without myelopathy or radiculopathy, cervical region: Secondary | ICD-10-CM | POA: Diagnosis not present

## 2012-01-01 DIAGNOSIS — M479 Spondylosis, unspecified: Secondary | ICD-10-CM

## 2012-01-01 DIAGNOSIS — M5030 Other cervical disc degeneration, unspecified cervical region: Secondary | ICD-10-CM | POA: Diagnosis not present

## 2012-01-06 ENCOUNTER — Encounter: Payer: Self-pay | Admitting: Family Medicine

## 2012-01-14 ENCOUNTER — Telehealth: Payer: Self-pay | Admitting: Family Medicine

## 2012-01-14 NOTE — Telephone Encounter (Signed)
Pt hasn't heard anything about xray results and then recommend her to someone for her migraines.  Wanted see neurologist in [place] after she had xrays

## 2012-01-14 NOTE — Telephone Encounter (Signed)
Patient requesting referral to neurologist, forward to PCP.Karagan Lehr, Rodena Medin

## 2012-01-16 ENCOUNTER — Encounter: Payer: Self-pay | Admitting: Family Medicine

## 2012-01-16 NOTE — Progress Notes (Signed)
Addended by: Zachery Dauer on: 01/16/2012 03:22 PM   Modules accepted: Orders

## 2012-01-27 ENCOUNTER — Other Ambulatory Visit: Payer: Self-pay | Admitting: *Deleted

## 2012-01-27 DIAGNOSIS — I10 Essential (primary) hypertension: Secondary | ICD-10-CM

## 2012-01-28 DIAGNOSIS — H43819 Vitreous degeneration, unspecified eye: Secondary | ICD-10-CM | POA: Diagnosis not present

## 2012-01-30 MED ORDER — TRIAMTERENE-HCTZ 37.5-25 MG PO CAPS: 1.0000 | ORAL_CAPSULE | Freq: Every day | ORAL | Status: DC

## 2012-01-31 DIAGNOSIS — M25559 Pain in unspecified hip: Secondary | ICD-10-CM | POA: Diagnosis not present

## 2012-01-31 DIAGNOSIS — G43909 Migraine, unspecified, not intractable, without status migrainosus: Secondary | ICD-10-CM | POA: Diagnosis not present

## 2012-02-04 DIAGNOSIS — H5200 Hypermetropia, unspecified eye: Secondary | ICD-10-CM | POA: Diagnosis not present

## 2012-02-04 DIAGNOSIS — G43109 Migraine with aura, not intractable, without status migrainosus: Secondary | ICD-10-CM | POA: Diagnosis not present

## 2012-02-04 DIAGNOSIS — H2510 Age-related nuclear cataract, unspecified eye: Secondary | ICD-10-CM | POA: Diagnosis not present

## 2012-02-04 DIAGNOSIS — H52229 Regular astigmatism, unspecified eye: Secondary | ICD-10-CM | POA: Diagnosis not present

## 2012-02-22 DIAGNOSIS — N390 Urinary tract infection, site not specified: Secondary | ICD-10-CM | POA: Diagnosis not present

## 2012-02-22 DIAGNOSIS — H6690 Otitis media, unspecified, unspecified ear: Secondary | ICD-10-CM | POA: Diagnosis not present

## 2012-02-22 DIAGNOSIS — R300 Dysuria: Secondary | ICD-10-CM | POA: Diagnosis not present

## 2012-02-28 ENCOUNTER — Other Ambulatory Visit: Payer: Self-pay | Admitting: Family Medicine

## 2012-02-28 NOTE — Telephone Encounter (Signed)
Patient is calling because her pharmacy has sent the refill request for Ultram but hasn't heard back anything.  She is going out of town tomorrow and needs this medication.  There are no more refills so they need a new Rx.

## 2012-03-07 DIAGNOSIS — R300 Dysuria: Secondary | ICD-10-CM | POA: Diagnosis not present

## 2012-03-07 DIAGNOSIS — N390 Urinary tract infection, site not specified: Secondary | ICD-10-CM | POA: Diagnosis not present

## 2012-03-16 ENCOUNTER — Other Ambulatory Visit: Payer: Self-pay | Admitting: Family Medicine

## 2012-03-24 DIAGNOSIS — G43909 Migraine, unspecified, not intractable, without status migrainosus: Secondary | ICD-10-CM | POA: Diagnosis not present

## 2012-03-24 DIAGNOSIS — G5710 Meralgia paresthetica, unspecified lower limb: Secondary | ICD-10-CM | POA: Diagnosis not present

## 2012-03-24 DIAGNOSIS — R42 Dizziness and giddiness: Secondary | ICD-10-CM | POA: Diagnosis not present

## 2012-03-25 ENCOUNTER — Other Ambulatory Visit: Payer: Self-pay | Admitting: *Deleted

## 2012-03-25 MED ORDER — SUMATRIPTAN SUCCINATE 100 MG PO TABS: 100.0000 mg | ORAL_TABLET | Freq: Once | ORAL | Status: DC | PRN

## 2012-04-13 DIAGNOSIS — R1310 Dysphagia, unspecified: Secondary | ICD-10-CM | POA: Diagnosis not present

## 2012-04-13 DIAGNOSIS — K219 Gastro-esophageal reflux disease without esophagitis: Secondary | ICD-10-CM | POA: Diagnosis not present

## 2012-04-13 DIAGNOSIS — K5900 Constipation, unspecified: Secondary | ICD-10-CM | POA: Diagnosis not present

## 2012-04-13 DIAGNOSIS — R1032 Left lower quadrant pain: Secondary | ICD-10-CM | POA: Diagnosis not present

## 2012-05-11 ENCOUNTER — Other Ambulatory Visit: Payer: Self-pay | Admitting: *Deleted

## 2012-05-11 MED ORDER — CYCLOBENZAPRINE HCL 10 MG PO TABS: 10.0000 mg | ORAL_TABLET | Freq: Three times a day (TID) | ORAL | Status: DC | PRN

## 2012-05-15 DIAGNOSIS — G43909 Migraine, unspecified, not intractable, without status migrainosus: Secondary | ICD-10-CM | POA: Diagnosis not present

## 2012-05-19 DIAGNOSIS — J019 Acute sinusitis, unspecified: Secondary | ICD-10-CM | POA: Diagnosis not present

## 2012-05-20 DIAGNOSIS — R42 Dizziness and giddiness: Secondary | ICD-10-CM | POA: Diagnosis not present

## 2012-05-20 DIAGNOSIS — R51 Headache: Secondary | ICD-10-CM | POA: Diagnosis not present

## 2012-05-26 HISTORY — PX: MASTOIDECTOMY: SHX711

## 2012-05-27 DIAGNOSIS — H70009 Acute mastoiditis without complications, unspecified ear: Secondary | ICD-10-CM | POA: Diagnosis not present

## 2012-05-28 ENCOUNTER — Other Ambulatory Visit: Payer: Self-pay | Admitting: Otolaryngology

## 2012-05-28 DIAGNOSIS — H66009 Acute suppurative otitis media without spontaneous rupture of ear drum, unspecified ear: Secondary | ICD-10-CM | POA: Diagnosis not present

## 2012-05-28 DIAGNOSIS — H70009 Acute mastoiditis without complications, unspecified ear: Secondary | ICD-10-CM | POA: Diagnosis not present

## 2012-05-28 DIAGNOSIS — H7090 Unspecified mastoiditis, unspecified ear: Secondary | ICD-10-CM | POA: Diagnosis not present

## 2012-05-28 DIAGNOSIS — Z9889 Other specified postprocedural states: Secondary | ICD-10-CM | POA: Diagnosis not present

## 2012-05-28 DIAGNOSIS — A499 Bacterial infection, unspecified: Secondary | ICD-10-CM | POA: Diagnosis not present

## 2012-05-28 DIAGNOSIS — L918 Other hypertrophic disorders of the skin: Secondary | ICD-10-CM | POA: Diagnosis not present

## 2012-05-28 DIAGNOSIS — H7010 Chronic mastoiditis, unspecified ear: Secondary | ICD-10-CM | POA: Diagnosis not present

## 2012-06-02 ENCOUNTER — Encounter: Payer: Self-pay | Admitting: Family Medicine

## 2012-06-23 DIAGNOSIS — N390 Urinary tract infection, site not specified: Secondary | ICD-10-CM | POA: Diagnosis not present

## 2012-07-01 ENCOUNTER — Other Ambulatory Visit: Payer: Self-pay | Admitting: *Deleted

## 2012-07-01 MED ORDER — CYCLOBENZAPRINE HCL 10 MG PO TABS: 10.0000 mg | ORAL_TABLET | Freq: Three times a day (TID) | ORAL | Status: DC | PRN

## 2012-07-02 DIAGNOSIS — R1310 Dysphagia, unspecified: Secondary | ICD-10-CM | POA: Diagnosis not present

## 2012-07-02 DIAGNOSIS — K449 Diaphragmatic hernia without obstruction or gangrene: Secondary | ICD-10-CM | POA: Diagnosis not present

## 2012-07-02 DIAGNOSIS — K222 Esophageal obstruction: Secondary | ICD-10-CM | POA: Diagnosis not present

## 2012-07-02 DIAGNOSIS — K219 Gastro-esophageal reflux disease without esophagitis: Secondary | ICD-10-CM | POA: Diagnosis not present

## 2012-07-02 DIAGNOSIS — K257 Chronic gastric ulcer without hemorrhage or perforation: Secondary | ICD-10-CM | POA: Diagnosis not present

## 2012-07-02 DIAGNOSIS — K229 Disease of esophagus, unspecified: Secondary | ICD-10-CM | POA: Diagnosis not present

## 2012-07-02 DIAGNOSIS — K319 Disease of stomach and duodenum, unspecified: Secondary | ICD-10-CM | POA: Diagnosis not present

## 2012-07-13 DIAGNOSIS — N390 Urinary tract infection, site not specified: Secondary | ICD-10-CM | POA: Diagnosis not present

## 2012-07-17 DIAGNOSIS — G5600 Carpal tunnel syndrome, unspecified upper limb: Secondary | ICD-10-CM | POA: Diagnosis not present

## 2012-07-17 DIAGNOSIS — G43909 Migraine, unspecified, not intractable, without status migrainosus: Secondary | ICD-10-CM | POA: Diagnosis not present

## 2012-07-17 DIAGNOSIS — M25559 Pain in unspecified hip: Secondary | ICD-10-CM | POA: Diagnosis not present

## 2012-07-29 DIAGNOSIS — R1310 Dysphagia, unspecified: Secondary | ICD-10-CM | POA: Diagnosis not present

## 2012-07-29 DIAGNOSIS — T17308A Unspecified foreign body in larynx causing other injury, initial encounter: Secondary | ICD-10-CM | POA: Diagnosis not present

## 2012-07-29 DIAGNOSIS — K219 Gastro-esophageal reflux disease without esophagitis: Secondary | ICD-10-CM | POA: Diagnosis not present

## 2012-08-07 DIAGNOSIS — R1310 Dysphagia, unspecified: Secondary | ICD-10-CM | POA: Diagnosis not present

## 2012-08-07 DIAGNOSIS — K219 Gastro-esophageal reflux disease without esophagitis: Secondary | ICD-10-CM | POA: Diagnosis not present

## 2012-09-01 ENCOUNTER — Other Ambulatory Visit: Payer: Self-pay | Admitting: *Deleted

## 2012-09-01 MED ORDER — CYCLOBENZAPRINE HCL 10 MG PO TABS: 10.0000 mg | ORAL_TABLET | Freq: Three times a day (TID) | ORAL | Status: DC | PRN

## 2012-09-03 DIAGNOSIS — J209 Acute bronchitis, unspecified: Secondary | ICD-10-CM | POA: Diagnosis not present

## 2012-09-03 DIAGNOSIS — R059 Cough, unspecified: Secondary | ICD-10-CM | POA: Diagnosis not present

## 2012-09-03 DIAGNOSIS — R05 Cough: Secondary | ICD-10-CM | POA: Diagnosis not present

## 2012-09-09 DIAGNOSIS — J209 Acute bronchitis, unspecified: Secondary | ICD-10-CM | POA: Diagnosis not present

## 2012-09-09 DIAGNOSIS — J019 Acute sinusitis, unspecified: Secondary | ICD-10-CM | POA: Diagnosis not present

## 2012-09-14 DIAGNOSIS — J040 Acute laryngitis: Secondary | ICD-10-CM | POA: Diagnosis not present

## 2012-09-24 DIAGNOSIS — H70009 Acute mastoiditis without complications, unspecified ear: Secondary | ICD-10-CM | POA: Diagnosis not present

## 2012-10-14 DIAGNOSIS — M1710 Unilateral primary osteoarthritis, unspecified knee: Secondary | ICD-10-CM | POA: Diagnosis not present

## 2012-10-14 DIAGNOSIS — H43399 Other vitreous opacities, unspecified eye: Secondary | ICD-10-CM | POA: Diagnosis not present

## 2012-10-16 DIAGNOSIS — G43909 Migraine, unspecified, not intractable, without status migrainosus: Secondary | ICD-10-CM | POA: Diagnosis not present

## 2012-10-16 DIAGNOSIS — M545 Low back pain, unspecified: Secondary | ICD-10-CM | POA: Diagnosis not present

## 2012-10-20 ENCOUNTER — Other Ambulatory Visit: Payer: Self-pay | Admitting: *Deleted

## 2012-10-20 MED ORDER — TRAMADOL HCL 50 MG PO TABS: 50.0000 mg | ORAL_TABLET | Freq: Four times a day (QID) | ORAL | Status: DC | PRN

## 2012-10-26 ENCOUNTER — Emergency Department (HOSPITAL_BASED_OUTPATIENT_CLINIC_OR_DEPARTMENT_OTHER)
Admission: EM | Admit: 2012-10-26 | Discharge: 2012-10-26 | Disposition: A | Discharge status: Home / Self Care | Payer: Medicare Other | Attending: Emergency Medicine | Admitting: Emergency Medicine

## 2012-10-26 ENCOUNTER — Encounter (HOSPITAL_BASED_OUTPATIENT_CLINIC_OR_DEPARTMENT_OTHER): Payer: Self-pay | Admitting: *Deleted

## 2012-10-26 DIAGNOSIS — Z8711 Personal history of peptic ulcer disease: Secondary | ICD-10-CM | POA: Insufficient documentation

## 2012-10-26 DIAGNOSIS — Z8619 Personal history of other infectious and parasitic diseases: Secondary | ICD-10-CM | POA: Insufficient documentation

## 2012-10-26 DIAGNOSIS — Z862 Personal history of diseases of the blood and blood-forming organs and certain disorders involving the immune mechanism: Secondary | ICD-10-CM | POA: Insufficient documentation

## 2012-10-26 DIAGNOSIS — N3000 Acute cystitis without hematuria: Secondary | ICD-10-CM | POA: Diagnosis not present

## 2012-10-26 DIAGNOSIS — Z8742 Personal history of other diseases of the female genital tract: Secondary | ICD-10-CM | POA: Diagnosis not present

## 2012-10-26 DIAGNOSIS — R3989 Other symptoms and signs involving the genitourinary system: Secondary | ICD-10-CM | POA: Insufficient documentation

## 2012-10-26 DIAGNOSIS — Z79899 Other long term (current) drug therapy: Secondary | ICD-10-CM | POA: Insufficient documentation

## 2012-10-26 DIAGNOSIS — Z87440 Personal history of urinary (tract) infections: Secondary | ICD-10-CM | POA: Insufficient documentation

## 2012-10-26 DIAGNOSIS — Z87891 Personal history of nicotine dependence: Secondary | ICD-10-CM | POA: Diagnosis not present

## 2012-10-26 DIAGNOSIS — N3090 Cystitis, unspecified without hematuria: Secondary | ICD-10-CM | POA: Diagnosis not present

## 2012-10-26 DIAGNOSIS — R350 Frequency of micturition: Secondary | ICD-10-CM | POA: Insufficient documentation

## 2012-10-26 DIAGNOSIS — Z8639 Personal history of other endocrine, nutritional and metabolic disease: Secondary | ICD-10-CM | POA: Insufficient documentation

## 2012-10-26 DIAGNOSIS — Z7982 Long term (current) use of aspirin: Secondary | ICD-10-CM | POA: Insufficient documentation

## 2012-10-26 LAB — URINALYSIS, ROUTINE W REFLEX MICROSCOPIC
Ketones, ur: 15 mg/dL — AB
Nitrite: POSITIVE — AB
Protein, ur: 100 mg/dL — AB
Urobilinogen, UA: 1 mg/dL (ref 0.0–1.0)

## 2012-10-26 LAB — URINE MICROSCOPIC-ADD ON

## 2012-10-26 MED ORDER — ACETAMINOPHEN-CODEINE #3 300-30 MG PO TABS: 1.0000 | ORAL_TABLET | Freq: Four times a day (QID) | ORAL | Status: DC | PRN

## 2012-10-26 MED ORDER — CEFDINIR 300 MG PO CAPS: 300.0000 mg | ORAL_CAPSULE | Freq: Two times a day (BID) | ORAL | Status: DC

## 2012-10-26 MED ORDER — ACETAMINOPHEN-CODEINE #3 300-30 MG PO TABS
1.0000 | ORAL_TABLET | Freq: Once | ORAL | Status: AC
  Administered 2012-10-26: 1 via ORAL
  Filled 2012-10-26: qty 1

## 2012-10-26 NOTE — ED Notes (Signed)
Dysuria, frequency since yesterday. States she has a hx of UTI's
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
## 2012-10-26 NOTE — ED Notes (Signed)
Pt. took one Clindamycin last night that she had left over from previous OM in 10/13.

## 2012-10-28 ENCOUNTER — Other Ambulatory Visit: Payer: Self-pay | Admitting: *Deleted

## 2012-11-05 ENCOUNTER — Other Ambulatory Visit: Payer: Self-pay | Admitting: *Deleted

## 2012-11-06 DIAGNOSIS — M47817 Spondylosis without myelopathy or radiculopathy, lumbosacral region: Secondary | ICD-10-CM | POA: Diagnosis not present

## 2012-11-06 DIAGNOSIS — IMO0002 Reserved for concepts with insufficient information to code with codable children: Secondary | ICD-10-CM | POA: Diagnosis not present

## 2012-11-10 ENCOUNTER — Telehealth: Payer: Self-pay | Admitting: Family Medicine

## 2012-11-10 NOTE — Telephone Encounter (Signed)
Patient is calling asking for enough Flexeril to last her until her appt on 4/8.

## 2012-11-10 NOTE — Telephone Encounter (Signed)
Will fwd. Request to PCP. Marland KitchenArlyss Repress

## 2012-11-11 ENCOUNTER — Encounter: Payer: Self-pay | Admitting: *Deleted

## 2012-11-11 NOTE — Telephone Encounter (Signed)
This encounter was created in error - please disregard.

## 2012-11-12 MED ORDER — CYCLOBENZAPRINE HCL 10 MG PO TABS: 10.0000 mg | ORAL_TABLET | Freq: Three times a day (TID) | ORAL | Status: DC | PRN

## 2012-11-12 NOTE — Telephone Encounter (Signed)
Hasn't heard from Dr Sheffield Slider and wants to know if she can get this refill - pls advise

## 2012-11-12 NOTE — Telephone Encounter (Signed)
Refill sent in, risks of the medication will be discussed with her on the scheduled office visit.

## 2012-11-12 NOTE — Telephone Encounter (Signed)
Called pt and she request refill of Flexeril.

## 2012-11-12 NOTE — Telephone Encounter (Signed)
Last refill 09-01-12 # 60. Pt has appt 12-01-12 with Dr.Hale. Lorenda Hatchet, Renato Battles

## 2012-11-18 DIAGNOSIS — IMO0002 Reserved for concepts with insufficient information to code with codable children: Secondary | ICD-10-CM | POA: Diagnosis not present

## 2012-11-24 ENCOUNTER — Other Ambulatory Visit: Payer: Self-pay | Admitting: *Deleted

## 2012-11-24 DIAGNOSIS — I10 Essential (primary) hypertension: Secondary | ICD-10-CM

## 2012-11-24 MED ORDER — TRIAMTERENE-HCTZ 37.5-25 MG PO CAPS: 1.0000 | ORAL_CAPSULE | Freq: Every day | ORAL | Status: DC

## 2012-12-01 ENCOUNTER — Encounter: Payer: Self-pay | Admitting: Family Medicine

## 2012-12-01 ENCOUNTER — Ambulatory Visit (INDEPENDENT_AMBULATORY_CARE_PROVIDER_SITE_OTHER): Payer: Medicare Other | Admitting: Family Medicine

## 2012-12-01 VITALS — BP 132/80 | HR 80 | Ht 60.0 in | Wt 166.0 lb

## 2012-12-01 DIAGNOSIS — N3020 Other chronic cystitis without hematuria: Secondary | ICD-10-CM | POA: Diagnosis not present

## 2012-12-01 DIAGNOSIS — J301 Allergic rhinitis due to pollen: Secondary | ICD-10-CM | POA: Diagnosis not present

## 2012-12-01 DIAGNOSIS — G43009 Migraine without aura, not intractable, without status migrainosus: Secondary | ICD-10-CM | POA: Diagnosis not present

## 2012-12-01 DIAGNOSIS — I10 Essential (primary) hypertension: Secondary | ICD-10-CM

## 2012-12-01 LAB — BASIC METABOLIC PANEL
CO2: 30 mEq/L (ref 19–32)
Chloride: 97 mEq/L (ref 96–112)
Glucose, Bld: 88 mg/dL (ref 70–99)
Potassium: 3.7 mEq/L (ref 3.5–5.3)
Sodium: 138 mEq/L (ref 135–145)

## 2012-12-01 MED ORDER — TRIAMTERENE-HCTZ 37.5-25 MG PO CAPS: 1.0000 | ORAL_CAPSULE | Freq: Every day | ORAL | Status: DC

## 2012-12-01 MED ORDER — HYDROXYZINE HCL 10 MG PO TABS: 10.0000 mg | ORAL_TABLET | Freq: Three times a day (TID) | ORAL | Status: DC | PRN

## 2012-12-01 MED ORDER — CYCLOBENZAPRINE HCL 10 MG PO TABS: 10.0000 mg | ORAL_TABLET | Freq: Three times a day (TID) | ORAL | Status: DC | PRN

## 2012-12-01 NOTE — Patient Instructions (Signed)
I will be retiring in the summer of this year. Thank you for allowing me to work with you in maintaining your health. Donnella Sham, MD will be my replacement beginning the second week in August. He is moving to [place] having completed his family medicine residency training in [place], [place]. Please let me know if you would like to be assigned to a different physician. You could consider seeing Dr Alberteen Sam on [address].

## 2012-12-01 NOTE — Assessment & Plan Note (Signed)
Continue prn Sumatriptan

## 2012-12-01 NOTE — Progress Notes (Signed)
  Subjective:    Patient ID: Theresa Brennan, female    DOB: [birth year], 68 y.o.   MRN: [id number]  HPI UTI - symptoms resolved after antibiotic. She had taken an antibiotic pill before going to the ED which may explain the negative culture. Is seeing more blood with recent UTI. Has a history of interstitial cystitis. Her urologist has prescribed Uribel, but not on currently, but hydroxyzine may be for this.  Does have urgency.   Concerned about blood sugar. No food today, but a Sprite 6 hours ago. Craving sweets and thirsty.   headache - helped by Cyclobenzaprine. Hasn't noted adverse effects on her voiding.   GERD - on recent endoscopy no ulcer, but still gets reflux.    Review of Systems     Objective:   Physical Exam  Cardiovascular: Normal rate and regular rhythm.   Pulmonary/Chest: Effort normal and breath sounds normal. She has no rales.  Neurological: She is alert.  Psychiatric: She has a normal mood and affect. Her behavior is normal. Thought content normal.          Assessment & Plan:

## 2012-12-01 NOTE — Assessment & Plan Note (Signed)
Use loratadine

## 2012-12-01 NOTE — Assessment & Plan Note (Signed)
I encouraged decreasing use of cyclobenzaprine and other anticholinergic medications unless her urologist specifically recommends them for urgency.

## 2012-12-01 NOTE — Assessment & Plan Note (Signed)
well controlled  

## 2012-12-02 ENCOUNTER — Encounter: Payer: Self-pay | Admitting: Family Medicine

## 2012-12-04 DIAGNOSIS — IMO0002 Reserved for concepts with insufficient information to code with codable children: Secondary | ICD-10-CM | POA: Diagnosis not present

## 2012-12-04 DIAGNOSIS — M6281 Muscle weakness (generalized): Secondary | ICD-10-CM | POA: Diagnosis not present

## 2012-12-04 DIAGNOSIS — J019 Acute sinusitis, unspecified: Secondary | ICD-10-CM | POA: Diagnosis not present

## 2012-12-16 DIAGNOSIS — J029 Acute pharyngitis, unspecified: Secondary | ICD-10-CM | POA: Diagnosis not present

## 2012-12-18 DIAGNOSIS — IMO0002 Reserved for concepts with insufficient information to code with codable children: Secondary | ICD-10-CM | POA: Diagnosis not present

## 2012-12-18 DIAGNOSIS — M6281 Muscle weakness (generalized): Secondary | ICD-10-CM | POA: Diagnosis not present

## 2012-12-25 DIAGNOSIS — H70009 Acute mastoiditis without complications, unspecified ear: Secondary | ICD-10-CM | POA: Diagnosis not present

## 2012-12-25 DIAGNOSIS — M25559 Pain in unspecified hip: Secondary | ICD-10-CM | POA: Diagnosis not present

## 2012-12-25 DIAGNOSIS — IMO0002 Reserved for concepts with insufficient information to code with codable children: Secondary | ICD-10-CM | POA: Diagnosis not present

## 2012-12-31 DIAGNOSIS — M48061 Spinal stenosis, lumbar region without neurogenic claudication: Secondary | ICD-10-CM | POA: Diagnosis not present

## 2012-12-31 DIAGNOSIS — M5137 Other intervertebral disc degeneration, lumbosacral region: Secondary | ICD-10-CM | POA: Diagnosis not present

## 2012-12-31 DIAGNOSIS — M545 Low back pain, unspecified: Secondary | ICD-10-CM | POA: Diagnosis not present

## 2012-12-31 DIAGNOSIS — M47817 Spondylosis without myelopathy or radiculopathy, lumbosacral region: Secondary | ICD-10-CM | POA: Diagnosis not present

## 2012-12-31 DIAGNOSIS — M169 Osteoarthritis of hip, unspecified: Secondary | ICD-10-CM | POA: Diagnosis not present

## 2012-12-31 DIAGNOSIS — M25559 Pain in unspecified hip: Secondary | ICD-10-CM | POA: Diagnosis not present

## 2013-01-17 DIAGNOSIS — N390 Urinary tract infection, site not specified: Secondary | ICD-10-CM | POA: Diagnosis not present

## 2013-01-17 DIAGNOSIS — R300 Dysuria: Secondary | ICD-10-CM | POA: Diagnosis not present

## 2013-01-20 ENCOUNTER — Other Ambulatory Visit: Payer: Self-pay | Admitting: *Deleted

## 2013-01-20 MED ORDER — SUMATRIPTAN SUCCINATE 100 MG PO TABS: 100.0000 mg | ORAL_TABLET | Freq: Once | ORAL | Status: DC | PRN

## 2013-01-27 ENCOUNTER — Other Ambulatory Visit: Payer: Self-pay | Admitting: *Deleted

## 2013-01-27 MED ORDER — TRAMADOL HCL 50 MG PO TABS: 50.0000 mg | ORAL_TABLET | Freq: Four times a day (QID) | ORAL | Status: DC | PRN

## 2013-02-06 ENCOUNTER — Encounter (HOSPITAL_BASED_OUTPATIENT_CLINIC_OR_DEPARTMENT_OTHER): Payer: Self-pay | Admitting: *Deleted

## 2013-02-06 ENCOUNTER — Emergency Department (HOSPITAL_BASED_OUTPATIENT_CLINIC_OR_DEPARTMENT_OTHER)
Admission: EM | Admit: 2013-02-06 | Discharge: 2013-02-06 | Disposition: A | Discharge status: Home / Self Care | Payer: Medicare Other | Attending: Emergency Medicine | Admitting: Emergency Medicine

## 2013-02-06 DIAGNOSIS — Z87891 Personal history of nicotine dependence: Secondary | ICD-10-CM | POA: Insufficient documentation

## 2013-02-06 DIAGNOSIS — Z880 Allergy status to penicillin: Secondary | ICD-10-CM | POA: Diagnosis not present

## 2013-02-06 DIAGNOSIS — Z90710 Acquired absence of both cervix and uterus: Secondary | ICD-10-CM | POA: Diagnosis not present

## 2013-02-06 DIAGNOSIS — Z9089 Acquired absence of other organs: Secondary | ICD-10-CM | POA: Insufficient documentation

## 2013-02-06 DIAGNOSIS — Z8742 Personal history of other diseases of the female genital tract: Secondary | ICD-10-CM | POA: Diagnosis not present

## 2013-02-06 DIAGNOSIS — Z8611 Personal history of tuberculosis: Secondary | ICD-10-CM | POA: Insufficient documentation

## 2013-02-06 DIAGNOSIS — N3010 Interstitial cystitis (chronic) without hematuria: Secondary | ICD-10-CM | POA: Insufficient documentation

## 2013-02-06 DIAGNOSIS — R350 Frequency of micturition: Secondary | ICD-10-CM | POA: Insufficient documentation

## 2013-02-06 DIAGNOSIS — Z8639 Personal history of other endocrine, nutritional and metabolic disease: Secondary | ICD-10-CM | POA: Insufficient documentation

## 2013-02-06 DIAGNOSIS — N390 Urinary tract infection, site not specified: Secondary | ICD-10-CM | POA: Insufficient documentation

## 2013-02-06 DIAGNOSIS — K259 Gastric ulcer, unspecified as acute or chronic, without hemorrhage or perforation: Secondary | ICD-10-CM | POA: Diagnosis not present

## 2013-02-06 DIAGNOSIS — Z79899 Other long term (current) drug therapy: Secondary | ICD-10-CM | POA: Diagnosis not present

## 2013-02-06 DIAGNOSIS — Z7982 Long term (current) use of aspirin: Secondary | ICD-10-CM | POA: Insufficient documentation

## 2013-02-06 DIAGNOSIS — Z862 Personal history of diseases of the blood and blood-forming organs and certain disorders involving the immune mechanism: Secondary | ICD-10-CM | POA: Insufficient documentation

## 2013-02-06 LAB — URINALYSIS, ROUTINE W REFLEX MICROSCOPIC
Glucose, UA: NEGATIVE mg/dL
Ketones, ur: 40 mg/dL — AB
pH: 5 (ref 5.0–8.0)

## 2013-02-06 LAB — URINE MICROSCOPIC-ADD ON

## 2013-02-06 MED ORDER — ACETAMINOPHEN-CODEINE #3 300-30 MG PO TABS
1.0000 | ORAL_TABLET | ORAL | Status: DC | PRN
  Administered 2013-02-06: 1 via ORAL
  Filled 2013-02-06: qty 1

## 2013-02-06 MED ORDER — ACETAMINOPHEN-CODEINE #3 300-30 MG PO TABS: 1.0000 | ORAL_TABLET | Freq: Four times a day (QID) | ORAL | Status: DC | PRN

## 2013-02-06 MED ORDER — CEPHALEXIN 250 MG PO CAPS
500.0000 mg | ORAL_CAPSULE | Freq: Once | ORAL | Status: AC
  Administered 2013-02-06: 500 mg via ORAL
  Filled 2013-02-06: qty 2

## 2013-02-06 MED ORDER — CEPHALEXIN 500 MG PO CAPS: 500.0000 mg | ORAL_CAPSULE | Freq: Four times a day (QID) | ORAL | Status: DC

## 2013-02-06 NOTE — ED Notes (Signed)
Patient states that she took a dose of pyridium prior to coming to the ER

## 2013-02-06 NOTE — ED Notes (Signed)
Pt c/o painful urination onset earlier this p.m.
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
## 2013-02-08 LAB — URINE CULTURE

## 2013-02-10 DIAGNOSIS — M545 Low back pain, unspecified: Secondary | ICD-10-CM | POA: Diagnosis not present

## 2013-02-10 DIAGNOSIS — M47817 Spondylosis without myelopathy or radiculopathy, lumbosacral region: Secondary | ICD-10-CM | POA: Diagnosis not present

## 2013-02-10 DIAGNOSIS — IMO0002 Reserved for concepts with insufficient information to code with codable children: Secondary | ICD-10-CM | POA: Diagnosis not present

## 2013-02-18 DIAGNOSIS — M5137 Other intervertebral disc degeneration, lumbosacral region: Secondary | ICD-10-CM | POA: Diagnosis not present

## 2013-02-18 DIAGNOSIS — M47817 Spondylosis without myelopathy or radiculopathy, lumbosacral region: Secondary | ICD-10-CM | POA: Diagnosis not present

## 2013-02-18 DIAGNOSIS — M545 Low back pain, unspecified: Secondary | ICD-10-CM | POA: Diagnosis not present

## 2013-02-18 DIAGNOSIS — IMO0002 Reserved for concepts with insufficient information to code with codable children: Secondary | ICD-10-CM | POA: Diagnosis not present

## 2013-02-19 DIAGNOSIS — N390 Urinary tract infection, site not specified: Secondary | ICD-10-CM | POA: Diagnosis not present

## 2013-03-03 DIAGNOSIS — H70009 Acute mastoiditis without complications, unspecified ear: Secondary | ICD-10-CM | POA: Diagnosis not present

## 2013-03-04 DIAGNOSIS — IMO0002 Reserved for concepts with insufficient information to code with codable children: Secondary | ICD-10-CM | POA: Diagnosis not present

## 2013-03-04 DIAGNOSIS — M5137 Other intervertebral disc degeneration, lumbosacral region: Secondary | ICD-10-CM | POA: Diagnosis not present

## 2013-03-04 DIAGNOSIS — M47817 Spondylosis without myelopathy or radiculopathy, lumbosacral region: Secondary | ICD-10-CM | POA: Diagnosis not present

## 2013-03-05 ENCOUNTER — Other Ambulatory Visit: Payer: Self-pay | Admitting: Neurosurgery

## 2013-03-05 DIAGNOSIS — M7130 Other bursal cyst, unspecified site: Secondary | ICD-10-CM | POA: Diagnosis not present

## 2013-03-05 DIAGNOSIS — M5126 Other intervertebral disc displacement, lumbar region: Secondary | ICD-10-CM | POA: Diagnosis not present

## 2013-03-08 ENCOUNTER — Encounter (HOSPITAL_COMMUNITY): Payer: Self-pay | Admitting: Pharmacy Technician

## 2013-03-09 ENCOUNTER — Encounter (HOSPITAL_COMMUNITY): Payer: Self-pay

## 2013-03-09 ENCOUNTER — Inpatient Hospital Stay (HOSPITAL_COMMUNITY)
Admission: RE | Admit: 2013-03-09 | Discharge: 2013-03-12 | DRG: 490 | Disposition: A | Discharge status: Home / Self Care | Payer: Medicare Other | Source: Ambulatory Visit | Attending: Anesthesiology | Admitting: Anesthesiology

## 2013-03-09 ENCOUNTER — Encounter (HOSPITAL_COMMUNITY)
Admission: RE | Admit: 2013-03-09 | Discharge: 2013-03-09 | Disposition: A | Discharge status: Home / Self Care | Payer: Medicare Other | Source: Ambulatory Visit | Attending: Neurosurgery | Admitting: Neurosurgery

## 2013-03-09 ENCOUNTER — Ambulatory Visit (HOSPITAL_COMMUNITY)
Admission: RE | Admit: 2013-03-09 | Discharge: 2013-03-09 | Disposition: A | Discharge status: Home / Self Care | Payer: Medicare Other | Source: Ambulatory Visit | Attending: Anesthesiology | Admitting: Anesthesiology

## 2013-03-09 DIAGNOSIS — Z01812 Encounter for preprocedural laboratory examination: Secondary | ICD-10-CM | POA: Diagnosis not present

## 2013-03-09 DIAGNOSIS — K219 Gastro-esophageal reflux disease without esophagitis: Secondary | ICD-10-CM | POA: Diagnosis not present

## 2013-03-09 DIAGNOSIS — G9741 Accidental puncture or laceration of dura during a procedure: Secondary | ICD-10-CM | POA: Diagnosis not present

## 2013-03-09 DIAGNOSIS — M7130 Other bursal cyst, unspecified site: Secondary | ICD-10-CM | POA: Diagnosis not present

## 2013-03-09 DIAGNOSIS — I10 Essential (primary) hypertension: Secondary | ICD-10-CM | POA: Diagnosis not present

## 2013-03-09 DIAGNOSIS — Z01818 Encounter for other preprocedural examination: Secondary | ICD-10-CM

## 2013-03-09 DIAGNOSIS — M539 Dorsopathy, unspecified: Secondary | ICD-10-CM | POA: Diagnosis not present

## 2013-03-09 DIAGNOSIS — IMO0002 Reserved for concepts with insufficient information to code with codable children: Secondary | ICD-10-CM | POA: Diagnosis present

## 2013-03-09 DIAGNOSIS — M5126 Other intervertebral disc displacement, lumbar region: Principal | ICD-10-CM | POA: Diagnosis present

## 2013-03-09 HISTORY — DX: Shortness of breath: R06.02

## 2013-03-09 HISTORY — DX: Essential (primary) hypertension: I10

## 2013-03-09 HISTORY — DX: Personal history of other diseases of the digestive system: Z87.19

## 2013-03-09 HISTORY — DX: Other specified symptoms and signs involving the circulatory and respiratory systems: R09.89

## 2013-03-09 HISTORY — DX: Nausea with vomiting, unspecified: R11.2

## 2013-03-09 HISTORY — DX: Unspecified osteoarthritis, unspecified site: M19.90

## 2013-03-09 HISTORY — DX: Unspecified cataract: H26.9

## 2013-03-09 HISTORY — DX: Other specified postprocedural states: Z98.890

## 2013-03-09 HISTORY — DX: Cardiac murmur, unspecified: R01.1

## 2013-03-09 HISTORY — DX: Gastro-esophageal reflux disease without esophagitis: K21.9

## 2013-03-09 HISTORY — DX: Anemia, unspecified: D64.9

## 2013-03-09 LAB — CBC
Hemoglobin: 13 g/dL (ref 12.0–15.0)
MCH: 30 pg (ref 26.0–34.0)
MCV: 86.8 fL (ref 78.0–100.0)
RBC: 4.33 MIL/uL (ref 3.87–5.11)

## 2013-03-09 LAB — BASIC METABOLIC PANEL
CO2: 31 mEq/L (ref 19–32)
Chloride: 97 mEq/L (ref 96–112)
Glucose, Bld: 91 mg/dL (ref 70–99)
Potassium: 2.8 mEq/L — ABNORMAL LOW (ref 3.5–5.1)
Sodium: 139 mEq/L (ref 135–145)

## 2013-03-09 NOTE — Progress Notes (Addendum)
Pt admits to having SOB with exertion but denies chest pain and being under the care of a cardiologist. Pt states that she had an echo and stress test done in 2005 and a cardiac cath around 40 years ago, "when I was in my twenties.," according to pt. Pt states that an EKG was done in October 2013 when she had ear surgery in Dr. Allayne Stack office (ENT) # [phone];  EKG results were requested along with latest office notes "STAT." Spoke with Erie Noe about orders and pt potassium level being at 2.8; Erie Noe to make Dr. Wynetta Emery aware. Made Dr. Chaney Malling ( anesthesiologist on-call ) aware that pt potassium was 2.8 and Dr. Lonie Peak office was notified.

## 2013-03-09 NOTE — Pre-Procedure Instructions (Signed)
Theresa Brennan  03/09/2013   Your procedure is scheduled on: Wednesday, March 10, 2013  Report to [facility] Stay [facility] at 9:30 AM.  Call this number if you have problems the morning of surgery: [phone]   Remember:   Do not eat food or drink liquids after midnight.   Take these medicines the morning of surgery with A SIP OF WATER: omeprazole (PRILOSEC) 40 MG capsule, estradiol (ESTRACE) 0.5 MG tablet  if needed: traMADol (ULTRAM) 50 MG tablet for pain Stop taking Aspirin and herbal medications. Do not take any NSAIDs ie: Ibuprofen, Advil, Naproxen or any medication containing Aspirin.  Do not wear jewelry, make-up or nail polish.  Do not wear lotions, powders, or perfumes. You may wear deodorant.  Do not shave 48 hours prior to surgery. Men may shave face and neck.  Do not bring valuables to the hospital.  [facility] is not responsible  for any belongings or valuables.  Contacts, dentures or bridgework may not be worn into surgery.  Leave suitcase in the car. After surgery it may be brought to your room.  For patients admitted to the hospital, checkout time is 11:00 AM the day of discharge.   Patients discharged the day of surgery will not be allowed to drive home.  Name and phone number of your driver:   Special Instructions: Shower using CHG 2 nights before surgery and the night before surgery.  If you shower the day of surgery use CHG.  Use special wash - you have one bottle of CHG for all showers.  You should use approximately 1/3 of the bottle for each shower.   Please read over the following fact sheets that you were given: Pain Booklet, Coughing and Deep Breathing, MRSA Information and Surgical Site Infection Prevention

## 2013-03-10 ENCOUNTER — Encounter (HOSPITAL_COMMUNITY)
Admission: RE | Disposition: A | Discharge status: Home / Self Care | Payer: Self-pay | Source: Ambulatory Visit | Attending: Neurosurgery

## 2013-03-10 ENCOUNTER — Inpatient Hospital Stay (HOSPITAL_COMMUNITY): Payer: Medicare Other | Admitting: Anesthesiology

## 2013-03-10 ENCOUNTER — Encounter (HOSPITAL_COMMUNITY): Payer: Self-pay | Admitting: *Deleted

## 2013-03-10 ENCOUNTER — Encounter (HOSPITAL_COMMUNITY): Payer: Self-pay | Admitting: Anesthesiology

## 2013-03-10 ENCOUNTER — Inpatient Hospital Stay (HOSPITAL_COMMUNITY): Payer: Medicare Other

## 2013-03-10 DIAGNOSIS — IMO0002 Reserved for concepts with insufficient information to code with codable children: Secondary | ICD-10-CM | POA: Diagnosis not present

## 2013-03-10 DIAGNOSIS — M5126 Other intervertebral disc displacement, lumbar region: Secondary | ICD-10-CM | POA: Diagnosis not present

## 2013-03-10 HISTORY — PX: LUMBAR LAMINECTOMY/DECOMPRESSION MICRODISCECTOMY: SHX5026

## 2013-03-10 SURGERY — LUMBAR LAMINECTOMY/DECOMPRESSION MICRODISCECTOMY 1 LEVEL
Anesthesia: General | Site: Back | Laterality: Left | Wound class: Clean

## 2013-03-10 MED ORDER — ACETAMINOPHEN 325 MG PO TABS: 650.0000 mg | ORAL_TABLET | ORAL | Status: DC | PRN

## 2013-03-10 MED ORDER — HEMOSTATIC AGENTS (NO CHARGE) OPTIME
TOPICAL | Status: DC | PRN
  Administered 2013-03-10: 1 via TOPICAL

## 2013-03-10 MED ORDER — FENTANYL CITRATE 0.05 MG/ML IJ SOLN
INTRAMUSCULAR | Status: DC | PRN
  Administered 2013-03-10: 100 ug via INTRAVENOUS

## 2013-03-10 MED ORDER — ASPIRIN 81 MG PO TABS: 81.0000 mg | ORAL_TABLET | Freq: Every day | ORAL | Status: DC

## 2013-03-10 MED ORDER — PHENOL 1.4 % MT LIQD: 1.0000 | OROMUCOSAL | Status: DC | PRN

## 2013-03-10 MED ORDER — HYDROMORPHONE HCL PF 1 MG/ML IJ SOLN
0.5000 mg | INTRAMUSCULAR | Status: DC | PRN
Start: 2013-03-10 — End: 2013-03-12
  Administered 2013-03-10 – 2013-03-12 (×6): 1 mg via INTRAVENOUS
  Filled 2013-03-10 (×5): qty 1

## 2013-03-10 MED ORDER — BACITRACIN 50000 UNITS IM SOLR
INTRAMUSCULAR | Status: AC
  Filled 2013-03-10: qty 1

## 2013-03-10 MED ORDER — PROPOFOL 10 MG/ML IV BOLUS
INTRAVENOUS | Status: DC | PRN
  Administered 2013-03-10: 150 mg via INTRAVENOUS

## 2013-03-10 MED ORDER — ONDANSETRON HCL 4 MG/2ML IJ SOLN: 4.0000 mg | INTRAMUSCULAR | Status: DC | PRN

## 2013-03-10 MED ORDER — LIDOCAINE HCL 4 % MT SOLN
OROMUCOSAL | Status: DC | PRN
  Administered 2013-03-10: 4 mL via TOPICAL

## 2013-03-10 MED ORDER — DEXAMETHASONE SODIUM PHOSPHATE 4 MG/ML IJ SOLN
INTRAMUSCULAR | Status: DC | PRN
  Administered 2013-03-10: 8 mg via INTRAVENOUS

## 2013-03-10 MED ORDER — LACTATED RINGERS IV SOLN
INTRAVENOUS | Status: DC | PRN
  Administered 2013-03-10 (×2): via INTRAVENOUS

## 2013-03-10 MED ORDER — HYDROMORPHONE HCL PF 1 MG/ML IJ SOLN
INTRAMUSCULAR | Status: AC
  Filled 2013-03-10: qty 1

## 2013-03-10 MED ORDER — LIDOCAINE-EPINEPHRINE 1 %-1:100000 IJ SOLN
INTRAMUSCULAR | Status: DC | PRN
  Administered 2013-03-10: 10 mL

## 2013-03-10 MED ORDER — HYDROXYZINE HCL 10 MG PO TABS
10.0000 mg | ORAL_TABLET | Freq: Three times a day (TID) | ORAL | Status: DC | PRN
  Administered 2013-03-11: 10 mg via ORAL
  Filled 2013-03-10 (×2): qty 1

## 2013-03-10 MED ORDER — OXYCODONE-ACETAMINOPHEN 5-325 MG PO TABS
1.0000 | ORAL_TABLET | ORAL | Status: DC | PRN
  Administered 2013-03-10 – 2013-03-12 (×7): 2 via ORAL
  Filled 2013-03-10 (×8): qty 2

## 2013-03-10 MED ORDER — CYCLOBENZAPRINE HCL 10 MG PO TABS: 10.0000 mg | ORAL_TABLET | Freq: Three times a day (TID) | ORAL | Status: DC | PRN

## 2013-03-10 MED ORDER — VANCOMYCIN HCL IN DEXTROSE 1-5 GM/200ML-% IV SOLN
1000.0000 mg | Freq: Two times a day (BID) | INTRAVENOUS | Status: AC
  Administered 2013-03-10 – 2013-03-11 (×2): 1000 mg via INTRAVENOUS
  Filled 2013-03-10 (×2): qty 200

## 2013-03-10 MED ORDER — THROMBIN 5000 UNITS EX SOLR
CUTANEOUS | Status: DC | PRN
  Administered 2013-03-10 (×2): 5000 [IU] via TOPICAL

## 2013-03-10 MED ORDER — CYCLOBENZAPRINE HCL 10 MG PO TABS
10.0000 mg | ORAL_TABLET | Freq: Three times a day (TID) | ORAL | Status: DC | PRN
  Administered 2013-03-10 – 2013-03-12 (×4): 10 mg via ORAL
  Filled 2013-03-10 (×4): qty 1

## 2013-03-10 MED ORDER — ACETAMINOPHEN 650 MG RE SUPP: 650.0000 mg | RECTAL | Status: DC | PRN

## 2013-03-10 MED ORDER — VANCOMYCIN HCL IN DEXTROSE 1-5 GM/200ML-% IV SOLN
INTRAVENOUS | Status: AC
  Administered 2013-03-10: 1000 mg via INTRAVENOUS
  Filled 2013-03-10: qty 200

## 2013-03-10 MED ORDER — SUMATRIPTAN SUCCINATE 100 MG PO TABS
100.0000 mg | ORAL_TABLET | Freq: Once | ORAL | Status: AC | PRN
  Administered 2013-03-11: 100 mg via ORAL
  Filled 2013-03-10: qty 1

## 2013-03-10 MED ORDER — CEFAZOLIN SODIUM 1-5 GM-% IV SOLN: 1.0000 g | Freq: Three times a day (TID) | INTRAVENOUS | Status: DC

## 2013-03-10 MED ORDER — ALUM & MAG HYDROXIDE-SIMETH 200-200-20 MG/5ML PO SUSP: 30.0000 mL | Freq: Four times a day (QID) | ORAL | Status: DC | PRN

## 2013-03-10 MED ORDER — MENTHOL 3 MG MT LOZG: 1.0000 | LOZENGE | OROMUCOSAL | Status: DC | PRN

## 2013-03-10 MED ORDER — PANTOPRAZOLE SODIUM 40 MG PO TBEC
40.0000 mg | DELAYED_RELEASE_TABLET | Freq: Every day | ORAL | Status: DC
  Administered 2013-03-11 – 2013-03-12 (×2): 40 mg via ORAL
  Filled 2013-03-10 (×2): qty 1

## 2013-03-10 MED ORDER — ONDANSETRON HCL 4 MG/2ML IJ SOLN: 4.0000 mg | Freq: Once | INTRAMUSCULAR | Status: DC | PRN

## 2013-03-10 MED ORDER — GLYCOPYRROLATE 0.2 MG/ML IJ SOLN
INTRAMUSCULAR | Status: DC | PRN
  Administered 2013-03-10: 0.4 mg via INTRAVENOUS

## 2013-03-10 MED ORDER — 0.9 % SODIUM CHLORIDE (POUR BTL) OPTIME
TOPICAL | Status: DC | PRN
  Administered 2013-03-10: 1000 mL

## 2013-03-10 MED ORDER — TRIAMTERENE-HCTZ 37.5-25 MG PO TABS
1.0000 | ORAL_TABLET | Freq: Every day | ORAL | Status: DC
  Administered 2013-03-10 – 2013-03-12 (×3): 1 via ORAL
  Filled 2013-03-10 (×3): qty 1

## 2013-03-10 MED ORDER — BUPIVACAINE HCL (PF) 0.25 % IJ SOLN
INTRAMUSCULAR | Status: DC | PRN
  Administered 2013-03-10: 10 mL

## 2013-03-10 MED ORDER — SODIUM CHLORIDE 0.9 % IJ SOLN: 3.0000 mL | INTRAMUSCULAR | Status: DC | PRN

## 2013-03-10 MED ORDER — ROCURONIUM BROMIDE 100 MG/10ML IV SOLN
INTRAVENOUS | Status: DC | PRN
  Administered 2013-03-10: 50 mg via INTRAVENOUS

## 2013-03-10 MED ORDER — SODIUM CHLORIDE 0.9 % IV SOLN
INTRAVENOUS | Status: AC
  Filled 2013-03-10: qty 500

## 2013-03-10 MED ORDER — SODIUM CHLORIDE 0.9 % IV SOLN: 250.0000 mL | INTRAVENOUS | Status: DC

## 2013-03-10 MED ORDER — LIDOCAINE HCL (CARDIAC) 20 MG/ML IV SOLN
INTRAVENOUS | Status: DC | PRN
  Administered 2013-03-10: 80 mg via INTRAVENOUS

## 2013-03-10 MED ORDER — SODIUM CHLORIDE 0.9 % IR SOLN
Status: DC | PRN
  Administered 2013-03-10: 13:00:00

## 2013-03-10 MED ORDER — HYDROMORPHONE HCL PF 1 MG/ML IJ SOLN
0.2500 mg | INTRAMUSCULAR | Status: DC | PRN
  Administered 2013-03-10 (×2): 0.5 mg via INTRAVENOUS

## 2013-03-10 MED ORDER — TRIAMTERENE-HCTZ 37.5-25 MG PO CAPS
1.0000 | ORAL_CAPSULE | Freq: Every day | ORAL | Status: DC
  Filled 2013-03-10: qty 1

## 2013-03-10 MED ORDER — NEOSTIGMINE METHYLSULFATE 1 MG/ML IJ SOLN
INTRAMUSCULAR | Status: DC | PRN
  Administered 2013-03-10: 3 mg via INTRAVENOUS

## 2013-03-10 MED ORDER — ONDANSETRON HCL 4 MG/2ML IJ SOLN
INTRAMUSCULAR | Status: DC | PRN
  Administered 2013-03-10: 4 mg via INTRAVENOUS

## 2013-03-10 MED ORDER — ESTRADIOL 1 MG PO TABS
0.5000 mg | ORAL_TABLET | Freq: Every day | ORAL | Status: DC
  Administered 2013-03-10 – 2013-03-12 (×3): 0.5 mg via ORAL
  Filled 2013-03-10 (×3): qty 0.5

## 2013-03-10 MED ORDER — ASPIRIN EC 81 MG PO TBEC
81.0000 mg | DELAYED_RELEASE_TABLET | Freq: Every day | ORAL | Status: DC
  Administered 2013-03-10 – 2013-03-12 (×3): 81 mg via ORAL
  Filled 2013-03-10 (×3): qty 1

## 2013-03-10 MED ORDER — LACTATED RINGERS IV SOLN
INTRAVENOUS | Status: DC
  Administered 2013-03-10: 11:00:00 via INTRAVENOUS

## 2013-03-10 MED ORDER — SODIUM CHLORIDE 0.9 % IJ SOLN
3.0000 mL | Freq: Two times a day (BID) | INTRAMUSCULAR | Status: DC
  Administered 2013-03-10 – 2013-03-12 (×4): 3 mL via INTRAVENOUS

## 2013-03-10 MED ORDER — TRAMADOL HCL 50 MG PO TABS
50.0000 mg | ORAL_TABLET | Freq: Four times a day (QID) | ORAL | Status: DC
  Administered 2013-03-11 – 2013-03-12 (×7): 50 mg via ORAL
  Filled 2013-03-10 (×11): qty 1

## 2013-03-10 SURGICAL SUPPLY — 59 items
BAG DECANTER FOR FLEXI CONT (MISCELLANEOUS) ×2 IMPLANT
BENZOIN TINCTURE PRP APPL 2/3 (GAUZE/BANDAGES/DRESSINGS) ×2 IMPLANT
BLADE SURG 11 STRL SS (BLADE) ×2 IMPLANT
BLADE SURG ROTATE 9660 (MISCELLANEOUS) IMPLANT
BRUSH SCRUB EZ PLAIN DRY (MISCELLANEOUS) ×2 IMPLANT
BUR MATCHSTICK NEURO 3.0 LAGG (BURR) ×2 IMPLANT
BUR PRECISION FLUTE 6.0 (BURR) ×2 IMPLANT
CANISTER SUCTION 2500CC (MISCELLANEOUS) ×2 IMPLANT
CLOTH BEACON ORANGE TIMEOUT ST (SAFETY) ×2 IMPLANT
CONT SPEC 4OZ CLIKSEAL STRL BL (MISCELLANEOUS) ×2 IMPLANT
DECANTER SPIKE VIAL GLASS SM (MISCELLANEOUS) ×2 IMPLANT
DERMABOND ADHESIVE PROPEN (GAUZE/BANDAGES/DRESSINGS) ×1
DERMABOND ADVANCED (GAUZE/BANDAGES/DRESSINGS) ×1
DERMABOND ADVANCED .7 DNX12 (GAUZE/BANDAGES/DRESSINGS) ×1 IMPLANT
DERMABOND ADVANCED .7 DNX6 (GAUZE/BANDAGES/DRESSINGS) ×1 IMPLANT
DRAPE LAPAROTOMY 100X72X124 (DRAPES) ×2 IMPLANT
DRAPE MICROSCOPE ZEISS OPMI (DRAPES) ×2 IMPLANT
DRAPE POUCH INSTRU U-SHP 10X18 (DRAPES) ×2 IMPLANT
DRAPE PROXIMA HALF (DRAPES) IMPLANT
DRAPE SURG 17X23 STRL (DRAPES) ×2 IMPLANT
DRSG OPSITE 4X5.5 SM (GAUZE/BANDAGES/DRESSINGS) ×2 IMPLANT
DURAFORM COLLAGEN 1X1 5-PACK (Neuro Prosthesis/Implant) ×2 IMPLANT
DURAPREP 26ML APPLICATOR (WOUND CARE) ×2 IMPLANT
DURASEAL APPLICATOR TIP (TIP) ×2 IMPLANT
DURASEAL SPINE SEALANT 3ML (MISCELLANEOUS) ×2 IMPLANT
ELECT REM PT RETURN 9FT ADLT (ELECTROSURGICAL) ×2
ELECTRODE REM PT RTRN 9FT ADLT (ELECTROSURGICAL) ×1 IMPLANT
GAUZE SPONGE 4X4 16PLY XRAY LF (GAUZE/BANDAGES/DRESSINGS) IMPLANT
GLOVE BIO SURGEON STRL SZ8 (GLOVE) ×2 IMPLANT
GLOVE BIOGEL PI IND STRL 8 (GLOVE) ×1 IMPLANT
GLOVE BIOGEL PI INDICATOR 8 (GLOVE) ×1
GLOVE ECLIPSE 7.5 STRL STRAW (GLOVE) ×2 IMPLANT
GLOVE EXAM NITRILE LRG STRL (GLOVE) IMPLANT
GLOVE EXAM NITRILE MD LF STRL (GLOVE) IMPLANT
GLOVE EXAM NITRILE XL STR (GLOVE) IMPLANT
GLOVE EXAM NITRILE XS STR PU (GLOVE) IMPLANT
GLOVE INDICATOR 8.5 STRL (GLOVE) ×2 IMPLANT
GOWN BRE IMP SLV AUR LG STRL (GOWN DISPOSABLE) ×2 IMPLANT
GOWN BRE IMP SLV AUR XL STRL (GOWN DISPOSABLE) ×8 IMPLANT
GOWN STRL REIN 2XL LVL4 (GOWN DISPOSABLE) IMPLANT
KIT BASIN OR (CUSTOM PROCEDURE TRAY) ×2 IMPLANT
KIT ROOM TURNOVER OR (KITS) ×2 IMPLANT
NEEDLE HYPO 22GX1.5 SAFETY (NEEDLE) ×2 IMPLANT
NEEDLE SPNL 22GX3.5 QUINCKE BK (NEEDLE) ×2 IMPLANT
NS IRRIG 1000ML POUR BTL (IV SOLUTION) ×2 IMPLANT
PACK LAMINECTOMY NEURO (CUSTOM PROCEDURE TRAY) ×2 IMPLANT
RUBBERBAND STERILE (MISCELLANEOUS) ×4 IMPLANT
SPONGE GAUZE 4X4 12PLY (GAUZE/BANDAGES/DRESSINGS) ×2 IMPLANT
SPONGE SURGIFOAM ABS GEL SZ50 (HEMOSTASIS) ×2 IMPLANT
STRIP CLOSURE SKIN 1/2X4 (GAUZE/BANDAGES/DRESSINGS) ×2 IMPLANT
SUT VIC AB 0 CT1 18XCR BRD8 (SUTURE) ×1 IMPLANT
SUT VIC AB 0 CT1 8-18 (SUTURE) ×1
SUT VIC AB 2-0 CT1 18 (SUTURE) ×2 IMPLANT
SUT VICRYL 4-0 PS2 18IN ABS (SUTURE) ×2 IMPLANT
SYR 20ML ECCENTRIC (SYRINGE) ×2 IMPLANT
TAPE STRIPS DRAPE STRL (GAUZE/BANDAGES/DRESSINGS) ×2 IMPLANT
TOWEL OR 17X24 6PK STRL BLUE (TOWEL DISPOSABLE) ×2 IMPLANT
TOWEL OR 17X26 10 PK STRL BLUE (TOWEL DISPOSABLE) ×2 IMPLANT
WATER STERILE IRR 1000ML POUR (IV SOLUTION) ×2 IMPLANT

## 2013-03-10 NOTE — Preoperative (Signed)
Beta Blockers   Reason not to administer Beta Blockers:Not Applicable 

## 2013-03-10 NOTE — Anesthesia Preprocedure Evaluation (Signed)
Anesthesia Evaluation  Patient identified by MRN, date of birth, ID band Patient awake    Reviewed: Allergy & Precautions, H&P , NPO status , Patient's Chart, lab work & pertinent test results  History of Anesthesia Complications (+) PONV  Airway Mallampati: I TM Distance: >3 FB Neck ROM: full    Dental   Pulmonary former smoker,          Cardiovascular hypertension, + Peripheral Vascular Disease Rhythm:regular Rate:Normal     Neuro/Psych  Headaches, PSYCHIATRIC DISORDERS    GI/Hepatic hiatal hernia, PUD, GERD-  ,  Endo/Other    Renal/GU      Musculoskeletal   Abdominal   Peds  Hematology   Anesthesia Other Findings   Reproductive/Obstetrics                           Anesthesia Physical Anesthesia Plan  ASA: II  Anesthesia Plan: General   Post-op Pain Management:    Induction: Intravenous  Airway Management Planned: Oral ETT  Additional Equipment:   Intra-op Plan:   Post-operative Plan: Extubation in OR  Informed Consent: I have reviewed the patients History and Physical, chart, labs and discussed the procedure including the risks, benefits and alternatives for the proposed anesthesia with the patient or authorized representative who has indicated his/her understanding and acceptance.     Plan Discussed with: CRNA, Anesthesiologist and Surgeon  Anesthesia Plan Comments:         Anesthesia Quick Evaluation

## 2013-03-10 NOTE — Op Note (Signed)
Preoperative diagnosis: Left L5 radiculopathy from ruptured disc L4-5 left  Postoperative diagnosis: Same  Procedure: Lumbar laminectomy microdiscectomy L4-5 left with microscopic discectomy microscopic dissection of the left L5 nerve root  complications: repair of inadvertent dural tear  Surgeon: Jillyn Hidden Dashaun Onstott  Assistant: Shirlean Kelly  Anesthesia: Gen.  EBL: Minimal  History of present illness: Patient is a very pleasant 67 intravenous a progress worsening back and thumb and left leg pain rating down L5 nerve root pattern into soft foot big toe with weakness in her EHL preoperative workup showed a very large disc herniation underneath the facet complex at L4-5 causing severe stenosis proximal L5 nerve root and distal L4 nerve root. Due to his failure conservative treatment imaging findings and progression of clinical syndrome I recommended a lumbar laminectomy and microdiscectomy L4-5 left decompressing the L5 nerve root I extensively reviewed the risks and benefits of that operation with the patient as well as her family as well as perioperative course and expectations of outcome alternatives of surgery she understood and agreed to proceed forward.  Operative procedure: Patient brought into the or was induced under general anesthesia positioned prone the Wilson frame her back was prepped and draped in routine sterile fashion after preoperative x-ray confirmed the appropriate level the incision was infiltrated with 10 cc lidocaine with epi a midline incision was made and Bovie light cautery was used to gas subcutaneous tissues subperiosteal dissections care lamina of L4 and L5 on the left side the interoperative X. identify the appropriate level so the interest of the lamina of L4 medial facet complex aggressive of lamina of L5 was drilled down a high-speed drill and laminotomy was begun the ligament that wasn't it was removed in piecemeal fashion the L5 nerve root was identified and the microscope  formation the L5 neuroforamen was unroofed a tractor spur out inferior to the L5 nerve root was causing compression of the lateral aspect of the spinal canal thecal sac and*into the distal in the spur I saw a dural tear arachnoid was still intact I extended laminotomy a little bit around the stair and then repaired primarily with 6-0 Prolene I did not appreciate any CSF leak after the repair. I then attention back to just above the L5 nerve root and drill down some additional medial facet complex and under been facets identified lateral disc. There was several large free fragment still partially contained within the ligament underneath the facet complex at this level. So the annulus Lonnie was made with the 11 blade scalpel and several large free fragments of disc were teased out from a knee to facet complex and the lateral part of the disc space. This was then cleaned out with Epstein curettes and pituitary rongeurs until is no further fibers appreciated I easily passed a coronary.her and angled hockey-stick underneath the facet complex and the disc space and outside the annulus as well as out the L4 and L5 foramen. No further fragments were palpated appreciated the thecal sac was widely decompressed the wound scope see her good fixing space was maintained I reinspected the dural repair and still saw no evidence of CSF leak. Simply some dura seal DuraGen plus Gelfoam and some additional dura seal overlying the repair. The Gelfoam over the remainder the dura and closed the wound with interrupted Vicryl and a running 4 subcuticular. Benzoin and Steri-Strips applied patient recovered in stable condition.

## 2013-03-10 NOTE — Transfer of Care (Signed)
Immediate Anesthesia Transfer of Care Note  Patient: Theresa Brennan  Procedure(s) Performed: Procedure(s) with comments: LUMBAR LAMINECTOMY/DECOMPRESSION MICRODISCECTOMY 1 LEVEL (Left) - Left L4-5 Intra/Extraforaminal diskectomy/resection of Synovial Cyst  Patient Location: PACU  Anesthesia Type:General  Level of Consciousness: awake, alert , oriented and patient cooperative  Airway & Oxygen Therapy: Patient Spontanous Breathing and Patient connected to nasal cannula oxygen  Post-op Assessment: Report given to PACU RN, Post -op Vital signs reviewed and stable and Patient moving all extremities  Post vital signs: Reviewed and stable  Complications: No apparent anesthesia complications

## 2013-03-10 NOTE — Anesthesia Procedure Notes (Signed)
Procedure Name: Intubation Date/Time: 03/10/2013 12:31 PM Performed by: Jerilee Hoh Pre-anesthesia Checklist: Patient identified, Emergency Drugs available, Suction available and Patient being monitored Patient Re-evaluated:Patient Re-evaluated prior to inductionOxygen Delivery Method: Circle system utilized Preoxygenation: Pre-oxygenation with 100% oxygen Intubation Type: IV induction Ventilation: Mask ventilation without difficulty and Oral airway inserted - appropriate to patient size Laryngoscope Size: Mac and 3 Grade View: Grade I Tube type: Oral Tube size: 7.5 mm Number of attempts: 1 Airway Equipment and Method: LTA kit utilized and Stylet Placement Confirmation: ETT inserted through vocal cords under direct vision,  positive ETCO2 and breath sounds checked- equal and bilateral Secured at: 21 cm Tube secured with: Tape Dental Injury: Teeth and Oropharynx as per pre-operative assessment
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
## 2013-03-11 MED ORDER — SUMATRIPTAN SUCCINATE 50 MG PO TABS
50.0000 mg | ORAL_TABLET | ORAL | Status: DC | PRN
  Filled 2013-03-11: qty 1

## 2013-03-11 NOTE — Progress Notes (Signed)
Subjective: Patient reports She's doing fairly well she had a rough night but her leg is significantly better than preop she has moderate back pain seems to be controlled on meds she a little bit of a headache this morning but is better now  Objective: Vital signs in last 24 hours: Temp:  [97.2 F (36.2 C)-98 F (36.7 C)] 97.7 F (36.5 C) (07/17 0327) Pulse Rate:  [81-95] 92 (07/17 0327) Resp:  [16-37] 16 (07/17 0327) BP: (103-174)/(60-86) 103/60 mmHg (07/17 0327) SpO2:  [93 %-100 %] 95 % (07/17 0327)  Intake/Output from previous day: 07/16 0701 - 07/17 0700 In: 1300 [I.V.:1300] Out: 150 [Blood:150] Intake/Output this shift:    Strength out of 5 significant proven in left EHL function wound dry and flat  Lab Results:  Recent Labs  03/09/13 1411  WBC 8.0  HGB 13.0  HCT 37.6  PLT 389   BMET  Recent Labs  03/09/13 1411  NA 139  K 2.8*  CL 97  CO2 31  GLUCOSE 91  BUN 11  CREATININE 0.69  CALCIUM 9.2    Studies/Results: Dg Chest 2 View  03/09/2013   *RADIOLOGY REPORT*  Clinical Data: Hypertension  CHEST - 2 VIEW  Comparison: None.  Findings: Heart size and vascular pattern are normal.  Lungs are clear.  IMPRESSION: Normal chest radiographs   Original Report Authenticated By: Esperanza Heir, M.D.   Dg Lumbar Spine 2-3 Views  03/10/2013   *RADIOLOGY REPORT*  Clinical Data: The L4-5 laminectomy and microdiskectomy.  LUMBAR SPINE - 2-3 VIEW  Comparison: Lumbar spine radiographs 03/05/2013 at [facility].MRI lumbar spine 12/31/2012 at [facility].  Findings: Image #1 demonstrates single intraoperative lateral view of the lumbar spine is submitted.  The levels are labeled in accordance with the previous studies.  A needle is directed at the L4-5 disc space.  Image 2 demonstrates a surgical probe at the level of the L5 lamina.  Image 3 demonstrates a surgical probe at the L4-5 disc level.  IMPRESSION: Intraoperative localization of the  L4-5 lumbar disc space.   Original Report Authenticated By: Marin Roberts, M.D.    Assessment/Plan: Continue bedrest for 24 hours mobilize in the morning  LOS: 1 day     Andersson Larrabee P 03/11/2013, 7:37 AM

## 2013-03-11 NOTE — Progress Notes (Signed)
UR COMPLETED  

## 2013-03-11 NOTE — Anesthesia Postprocedure Evaluation (Signed)
  Anesthesia Post-op Note  Patient: Theresa Brennan  Procedure(s) Performed: Procedure(s) with comments: LUMBAR LAMINECTOMY/DECOMPRESSION MICRODISCECTOMY 1 LEVEL (Left) - Left L4-5 Intra/Extraforaminal diskectomy/resection of Synovial Cyst  Patient Location: PACU  Anesthesia Type:General  Level of Consciousness: awake, alert , oriented and patient cooperative  Airway and Oxygen Therapy: Patient Spontanous Breathing  Post-op Pain: mild  Post-op Assessment: Post-op Vital signs reviewed, Patient's Cardiovascular Status Stable, Respiratory Function Stable, Patent Airway, No signs of Nausea or vomiting and Pain level controlled  Post-op Vital Signs: stable  Complications: No apparent anesthesia complications

## 2013-03-12 ENCOUNTER — Encounter: Payer: Self-pay | Admitting: Family Medicine

## 2013-03-12 MED ORDER — DEXAMETHASONE SODIUM PHOSPHATE 10 MG/ML IJ SOLN
10.0000 mg | Freq: Once | INTRAMUSCULAR | Status: AC
  Filled 2013-03-12: qty 1

## 2013-03-12 MED ORDER — POTASSIUM CHLORIDE CRYS ER 20 MEQ PO TBCR
40.0000 meq | EXTENDED_RELEASE_TABLET | Freq: Two times a day (BID) | ORAL | Status: DC
  Administered 2013-03-12: 40 meq via ORAL
  Filled 2013-03-12 (×2): qty 2

## 2013-03-12 MED ORDER — CYCLOBENZAPRINE HCL 10 MG PO TABS: 10.0000 mg | ORAL_TABLET | Freq: Three times a day (TID) | ORAL | Status: DC | PRN

## 2013-03-12 MED ORDER — DEXAMETHASONE SODIUM PHOSPHATE 10 MG/ML IJ SOLN
INTRAMUSCULAR | Status: AC
  Administered 2013-03-12: 10 mg
  Filled 2013-03-12: qty 1

## 2013-03-12 MED ORDER — OXYCODONE-ACETAMINOPHEN 5-325 MG PO TABS: 1.0000 | ORAL_TABLET | ORAL | Status: DC | PRN

## 2013-03-12 MED ORDER — SODIUM CHLORIDE 0.9 % IV BOLUS (SEPSIS)
500.0000 mL | Freq: Once | INTRAVENOUS | Status: AC
  Administered 2013-03-12: 500 mL via INTRAVENOUS

## 2013-03-12 NOTE — Discharge Summary (Signed)
  Physician Discharge Summary  Patient ID: Theresa Brennan MRN: [id number] DOB/AGE: [id number] y.o.  Admit date: 03/10/2013 Discharge date: 03/12/2013  Admission Diagnoses: Left L5 radiculopathy from large acute disc L4-5 left  Discharge Diagnoses: Same Active Problems:   * No active hospital problems. *   Discharged Condition: good  Hospital Course: Patient admitted hospital underwent an L4-5 left-sided laminectomy microdiscectomy intraoperative that she had an intraoperative CSF leak that was primarily repaired patient was transferred from the OR to recovery room and the floor to where she convalesced well but remained flat for 48 hours. Patient mobilizes well and posterior day 3 had no evidence of CSF leak pain was significantly improved and patient is stable for discharge home scheduled followup approximately 2 weeks. Patient is instructed to call if any pain recurs she experiences a severe headache is worse when she was up better when she was down or any drainage from her incision.  Consults: Significant Diagnostic Studies: Treatments: Lumbar laminectomy discectomy L4-5 left Discharge Exam: Blood pressure 107/58, pulse 79, temperature 98.2 F (36.8 C), temperature source Oral, resp. rate 18, SpO2 94.00%. Strength out of 5 wound clean and dry and flat  Disposition: Home     Medication List         aspirin 81 MG tablet  Take 81 mg by mouth daily.     cyclobenzaprine 10 MG tablet  Commonly known as:  FLEXERIL  Take 1 tablet (10 mg total) by mouth 3 (three) times daily as needed for muscle spasms.     cyclobenzaprine 10 MG tablet  Commonly known as:  FLEXERIL  Take 10 mg by mouth 3 (three) times daily as needed for muscle spasms.     estradiol 0.5 MG tablet  Commonly known as:  ESTRACE  Take 0.5 mg by mouth daily.     hydrOXYzine 10 MG tablet  Commonly known as:  ATARAX/VISTARIL  Take 1 tablet (10 mg total) by mouth 3 (three) times daily as needed for itching.     omeprazole 40 MG capsule  Commonly known as:  PRILOSEC  Take 40 mg by mouth daily.     oxyCODONE-acetaminophen 5-325 MG per tablet  Commonly known as:  PERCOCET/ROXICET  Take 1-2 tablets by mouth every 4 (four) hours as needed.     SUMAtriptan 100 MG tablet  Commonly known as:  IMITREX  Take 1 tablet (100 mg total) by mouth once as needed. For migraine     traMADol 50 MG tablet  Commonly known as:  ULTRAM  Take 1 tablet (50 mg total) by mouth every 6 (six) hours as needed for pain.     triamterene-hydrochlorothiazide 37.5-25 MG per capsule  Commonly known as:  DYAZIDE  Take 1 each (1 capsule total) by mouth daily.           Follow-up Information   Follow up with Mckenleigh Tarlton P, MD.   Contact information:   [address] [place] [address] [phone]       Signed: Indiyah Paone P 03/12/2013, 11:02 AM

## 2013-03-12 NOTE — Progress Notes (Signed)
Subjective: Patient reports She's doing well no headache no leg pain to some back stiffness and soreness  Objective: Vital signs in last 24 hours: Temp:  [98 F (36.7 C)-98.8 F (37.1 C)] 98.2 F (36.8 C) (07/18 0321) Pulse Rate:  [76-87] 87 (07/18 0321) Resp:  [14-18] 14 (07/18 0321) BP: (111-122)/(65-76) 111/75 mmHg (07/18 0321) SpO2:  [93 %-94 %] 93 % (07/18 0321)  Intake/Output from previous day: 07/17 0701 - 07/18 0700 In: 600 [P.O.:600] Out: -  Intake/Output this shift:    Awake alert oriented wound flat dry strength 5 out of 5  Lab Results:  Recent Labs  03/09/13 1411  WBC 8.0  HGB 13.0  HCT 37.6  PLT 389   BMET  Recent Labs  03/09/13 1411  NA 139  K 2.8*  CL 97  CO2 31  GLUCOSE 91  BUN 11  CREATININE 0.69  CALCIUM 9.2    Studies/Results: Dg Lumbar Spine 2-3 Views  03/10/2013   *RADIOLOGY REPORT*  Clinical Data: The L4-5 laminectomy and microdiskectomy.  LUMBAR SPINE - 2-3 VIEW  Comparison: Lumbar spine radiographs 03/05/2013 at [facility].MRI lumbar spine 12/31/2012 at [facility].  Findings: Image #1 demonstrates single intraoperative lateral view of the lumbar spine is submitted.  The levels are labeled in accordance with the previous studies.  A needle is directed at the L4-5 disc space.  Image 2 demonstrates a surgical probe at the level of the L5 lamina.  Image 3 demonstrates a surgical probe at the L4-5 disc level.  IMPRESSION: Intraoperative localization of the L4-5 lumbar disc space.   Original Report Authenticated By: Marin Roberts, M.D.    Assessment/Plan: Was slowly start mobilizing this morning we'll discharge later today she continues to improve no clinical evidence of CSF leak  LOS: 2 days     Theresa Brennan P 03/12/2013, 7:16 AM

## 2013-03-12 NOTE — Progress Notes (Signed)
Pt. discharged home accompanied by husband. Prescriptions and discharge instructions given with verbalization of understanding. Incision site on back with no s/s of infection - no swelling, redness, bleeding, and/or drainage noted. Pain med given just before leaving. Opportunity given to ask questions but no question asked. Pt. transported out of this unit in wheelchair by the volunteer. 

## 2013-03-23 ENCOUNTER — Other Ambulatory Visit: Payer: Self-pay | Admitting: Family Medicine

## 2013-03-23 NOTE — Telephone Encounter (Signed)
Will fwd to MD.  Kamin Niblack L, CMA  

## 2013-03-23 NOTE — Telephone Encounter (Signed)
Pt called to let us know that the pharmacy is requesting refill on Tramadol. She has no refills left but wants the to still be sent to the pharmacy. JW

## 2013-03-29 MED ORDER — TRAMADOL HCL 50 MG PO TABS: 50.0000 mg | ORAL_TABLET | Freq: Four times a day (QID) | ORAL | Status: DC | PRN

## 2013-04-13 DIAGNOSIS — M79609 Pain in unspecified limb: Secondary | ICD-10-CM | POA: Diagnosis not present

## 2013-04-13 DIAGNOSIS — M545 Low back pain, unspecified: Secondary | ICD-10-CM | POA: Diagnosis not present

## 2013-04-13 DIAGNOSIS — G988 Other disorders of nervous system: Secondary | ICD-10-CM | POA: Diagnosis not present

## 2013-04-13 DIAGNOSIS — Y9389 Activity, other specified: Secondary | ICD-10-CM | POA: Diagnosis not present

## 2013-04-15 DIAGNOSIS — R300 Dysuria: Secondary | ICD-10-CM | POA: Diagnosis not present

## 2013-04-15 DIAGNOSIS — N390 Urinary tract infection, site not specified: Secondary | ICD-10-CM | POA: Diagnosis not present

## 2013-04-30 DIAGNOSIS — R319 Hematuria, unspecified: Secondary | ICD-10-CM | POA: Diagnosis not present

## 2013-04-30 DIAGNOSIS — R350 Frequency of micturition: Secondary | ICD-10-CM | POA: Diagnosis not present

## 2013-04-30 DIAGNOSIS — N3010 Interstitial cystitis (chronic) without hematuria: Secondary | ICD-10-CM | POA: Diagnosis not present

## 2013-04-30 DIAGNOSIS — N390 Urinary tract infection, site not specified: Secondary | ICD-10-CM | POA: Diagnosis not present

## 2013-05-04 DIAGNOSIS — R319 Hematuria, unspecified: Secondary | ICD-10-CM | POA: Diagnosis not present

## 2013-05-07 DIAGNOSIS — R310 Gross hematuria: Secondary | ICD-10-CM | POA: Diagnosis not present

## 2013-05-10 DIAGNOSIS — M545 Low back pain, unspecified: Secondary | ICD-10-CM | POA: Diagnosis not present

## 2013-05-10 DIAGNOSIS — M6281 Muscle weakness (generalized): Secondary | ICD-10-CM | POA: Diagnosis not present

## 2013-05-10 DIAGNOSIS — IMO0002 Reserved for concepts with insufficient information to code with codable children: Secondary | ICD-10-CM | POA: Diagnosis not present

## 2013-05-11 DIAGNOSIS — R319 Hematuria, unspecified: Secondary | ICD-10-CM | POA: Diagnosis not present

## 2013-05-11 DIAGNOSIS — R310 Gross hematuria: Secondary | ICD-10-CM | POA: Diagnosis not present

## 2013-05-13 DIAGNOSIS — N952 Postmenopausal atrophic vaginitis: Secondary | ICD-10-CM | POA: Diagnosis not present

## 2013-05-16 DIAGNOSIS — R109 Unspecified abdominal pain: Secondary | ICD-10-CM | POA: Diagnosis not present

## 2013-05-16 DIAGNOSIS — N390 Urinary tract infection, site not specified: Secondary | ICD-10-CM | POA: Diagnosis not present

## 2013-05-16 DIAGNOSIS — I10 Essential (primary) hypertension: Secondary | ICD-10-CM | POA: Diagnosis not present

## 2013-05-16 DIAGNOSIS — R300 Dysuria: Secondary | ICD-10-CM | POA: Diagnosis not present

## 2013-05-17 DIAGNOSIS — N390 Urinary tract infection, site not specified: Secondary | ICD-10-CM | POA: Diagnosis not present

## 2013-05-18 ENCOUNTER — Other Ambulatory Visit: Payer: Self-pay | Admitting: Family Medicine

## 2013-05-18 DIAGNOSIS — M545 Low back pain, unspecified: Secondary | ICD-10-CM | POA: Diagnosis not present

## 2013-05-18 DIAGNOSIS — M6281 Muscle weakness (generalized): Secondary | ICD-10-CM | POA: Diagnosis not present

## 2013-05-18 MED ORDER — SUMATRIPTAN SUCCINATE 100 MG PO TABS: 100.0000 mg | ORAL_TABLET | Freq: Once | ORAL | Status: DC | PRN

## 2013-05-25 DIAGNOSIS — M545 Low back pain, unspecified: Secondary | ICD-10-CM | POA: Diagnosis not present

## 2013-05-25 DIAGNOSIS — M6281 Muscle weakness (generalized): Secondary | ICD-10-CM | POA: Diagnosis not present

## 2013-05-26 DIAGNOSIS — R109 Unspecified abdominal pain: Secondary | ICD-10-CM | POA: Diagnosis not present

## 2013-05-26 DIAGNOSIS — N3000 Acute cystitis without hematuria: Secondary | ICD-10-CM | POA: Diagnosis not present

## 2013-05-26 DIAGNOSIS — N390 Urinary tract infection, site not specified: Secondary | ICD-10-CM | POA: Diagnosis not present

## 2013-05-26 DIAGNOSIS — R300 Dysuria: Secondary | ICD-10-CM | POA: Diagnosis not present

## 2013-05-27 DIAGNOSIS — N3000 Acute cystitis without hematuria: Secondary | ICD-10-CM | POA: Diagnosis not present

## 2013-06-01 DIAGNOSIS — M545 Low back pain, unspecified: Secondary | ICD-10-CM | POA: Diagnosis not present

## 2013-06-01 DIAGNOSIS — M6281 Muscle weakness (generalized): Secondary | ICD-10-CM | POA: Diagnosis not present

## 2013-06-03 DIAGNOSIS — M6281 Muscle weakness (generalized): Secondary | ICD-10-CM | POA: Diagnosis not present

## 2013-06-03 DIAGNOSIS — M545 Low back pain, unspecified: Secondary | ICD-10-CM | POA: Diagnosis not present

## 2013-06-08 DIAGNOSIS — M545 Low back pain, unspecified: Secondary | ICD-10-CM | POA: Diagnosis not present

## 2013-06-08 DIAGNOSIS — M6281 Muscle weakness (generalized): Secondary | ICD-10-CM | POA: Diagnosis not present

## 2013-06-10 DIAGNOSIS — M6281 Muscle weakness (generalized): Secondary | ICD-10-CM | POA: Diagnosis not present

## 2013-06-10 DIAGNOSIS — IMO0002 Reserved for concepts with insufficient information to code with codable children: Secondary | ICD-10-CM | POA: Diagnosis not present

## 2013-06-15 DIAGNOSIS — IMO0002 Reserved for concepts with insufficient information to code with codable children: Secondary | ICD-10-CM | POA: Diagnosis not present

## 2013-06-15 DIAGNOSIS — M6281 Muscle weakness (generalized): Secondary | ICD-10-CM | POA: Diagnosis not present

## 2013-06-18 DIAGNOSIS — M6281 Muscle weakness (generalized): Secondary | ICD-10-CM | POA: Diagnosis not present

## 2013-06-18 DIAGNOSIS — M545 Low back pain, unspecified: Secondary | ICD-10-CM | POA: Diagnosis not present

## 2013-06-21 ENCOUNTER — Other Ambulatory Visit (HOSPITAL_COMMUNITY): Payer: Self-pay | Admitting: Specialist

## 2013-06-21 DIAGNOSIS — Z1231 Encounter for screening mammogram for malignant neoplasm of breast: Secondary | ICD-10-CM

## 2013-06-22 DIAGNOSIS — H70009 Acute mastoiditis without complications, unspecified ear: Secondary | ICD-10-CM | POA: Diagnosis not present

## 2013-06-29 DIAGNOSIS — M545 Low back pain, unspecified: Secondary | ICD-10-CM | POA: Diagnosis not present

## 2013-06-29 DIAGNOSIS — M6281 Muscle weakness (generalized): Secondary | ICD-10-CM | POA: Diagnosis not present

## 2013-07-01 ENCOUNTER — Ambulatory Visit (HOSPITAL_COMMUNITY): Payer: Medicare Other

## 2013-07-02 ENCOUNTER — Telehealth: Payer: Self-pay | Admitting: Family Medicine

## 2013-07-02 DIAGNOSIS — M545 Low back pain, unspecified: Secondary | ICD-10-CM | POA: Diagnosis not present

## 2013-07-02 DIAGNOSIS — M6281 Muscle weakness (generalized): Secondary | ICD-10-CM | POA: Diagnosis not present

## 2013-07-02 DIAGNOSIS — F4310 Post-traumatic stress disorder, unspecified: Secondary | ICD-10-CM | POA: Diagnosis not present

## 2013-07-02 NOTE — Telephone Encounter (Signed)
Pt called and would like a refill of her bp medication Dyazide. jw

## 2013-07-05 ENCOUNTER — Other Ambulatory Visit: Payer: Self-pay | Admitting: Family Medicine

## 2013-07-05 DIAGNOSIS — I10 Essential (primary) hypertension: Secondary | ICD-10-CM

## 2013-07-05 MED ORDER — TRIAMTERENE-HCTZ 37.5-25 MG PO CAPS: 1.0000 | ORAL_CAPSULE | Freq: Every day | ORAL | Status: DC

## 2013-07-05 NOTE — Telephone Encounter (Signed)
Refill sent to pharmacy.   

## 2013-07-06 DIAGNOSIS — M545 Low back pain, unspecified: Secondary | ICD-10-CM | POA: Diagnosis not present

## 2013-07-06 DIAGNOSIS — M6281 Muscle weakness (generalized): Secondary | ICD-10-CM | POA: Diagnosis not present

## 2013-07-08 DIAGNOSIS — M545 Low back pain, unspecified: Secondary | ICD-10-CM | POA: Diagnosis not present

## 2013-07-08 DIAGNOSIS — M6281 Muscle weakness (generalized): Secondary | ICD-10-CM | POA: Diagnosis not present

## 2013-07-09 ENCOUNTER — Ambulatory Visit (HOSPITAL_COMMUNITY)
Admission: RE | Admit: 2013-07-09 | Discharge: 2013-07-09 | Disposition: A | Discharge status: Home / Self Care | Payer: Medicare Other | Source: Ambulatory Visit | Attending: Specialist | Admitting: Specialist

## 2013-07-09 DIAGNOSIS — Z1231 Encounter for screening mammogram for malignant neoplasm of breast: Secondary | ICD-10-CM | POA: Diagnosis not present

## 2013-07-13 DIAGNOSIS — M545 Low back pain, unspecified: Secondary | ICD-10-CM | POA: Diagnosis not present

## 2013-07-13 DIAGNOSIS — M6281 Muscle weakness (generalized): Secondary | ICD-10-CM | POA: Diagnosis not present

## 2013-07-14 DIAGNOSIS — F4310 Post-traumatic stress disorder, unspecified: Secondary | ICD-10-CM | POA: Diagnosis not present

## 2013-07-16 DIAGNOSIS — M545 Low back pain, unspecified: Secondary | ICD-10-CM | POA: Diagnosis not present

## 2013-07-16 DIAGNOSIS — M6281 Muscle weakness (generalized): Secondary | ICD-10-CM | POA: Diagnosis not present

## 2013-07-28 DIAGNOSIS — F4310 Post-traumatic stress disorder, unspecified: Secondary | ICD-10-CM | POA: Diagnosis not present

## 2013-08-12 DIAGNOSIS — N390 Urinary tract infection, site not specified: Secondary | ICD-10-CM | POA: Diagnosis not present

## 2013-08-24 ENCOUNTER — Encounter: Payer: Self-pay | Admitting: Emergency Medicine

## 2013-08-24 ENCOUNTER — Ambulatory Visit (INDEPENDENT_AMBULATORY_CARE_PROVIDER_SITE_OTHER): Payer: Medicare Other | Admitting: Emergency Medicine

## 2013-08-24 VITALS — BP 120/84 | HR 97 | Temp 98.3°F | Ht 60.0 in | Wt 161.9 lb

## 2013-08-24 DIAGNOSIS — J111 Influenza due to unidentified influenza virus with other respiratory manifestations: Secondary | ICD-10-CM | POA: Diagnosis not present

## 2013-08-24 MED ORDER — HYDROCODONE-HOMATROPINE 5-1.5 MG/5ML PO SYRP: 5.0000 mL | ORAL_SOLUTION | Freq: Three times a day (TID) | ORAL | Status: DC | PRN

## 2013-08-24 MED ORDER — OSELTAMIVIR PHOSPHATE 75 MG PO CAPS: 75.0000 mg | ORAL_CAPSULE | Freq: Two times a day (BID) | ORAL | Status: DC

## 2013-08-24 NOTE — Patient Instructions (Signed)
It was nice to meet you! I'm sorry your not feeling well. I think you may have the flu.  Take Tamiflu 1 pill twice a day for 5 days. Use Hycodan cough syrup every 8 hours as needed for cough - do NOT drive while on this medicine. You can take a teaspoon of honey every 1-2 hours as needed for cough as well. Salt water gargles and hot drinks will be soothing to your throat. You can use ice or heat around your ribs. Ibuprofen will also help that rib pain.  If you develop fevers, are having a hard breathing, or are not improving by next week, please come back.

## 2013-08-24 NOTE — Progress Notes (Signed)
   Subjective:    Patient ID: Theresa Brennan, female    DOB: [birth year], 67 y.o.   MRN: [id number]  HPI Theresa Brennan is here for a SDA for cough.  She reports that she developed a cough 4 days ago.  Associated with sore throat, mild dyspnea, body aches, headache and chills.  No fevers or nasal symptoms.  Wheezing at nighttime.  On doxycycline for a UTI currently.  Has tried mucinex without improvement.  Has pain around her lower rib cage, worse with cough.  Did not receive flu shot this year.  I have reviewed and updated the following as appropriate: allergies and current medications SHx: non smoker   Review of Systems See HPI    Objective:   Physical Exam BP 120/84  Pulse 97  Temp(Src) 98.3 F (36.8 C) (Oral)  Ht 5' (1.524 m)  Wt 161 lb 14.4 oz (73.437 kg)  BMI 31.62 kg/m2  SpO2 96% Gen: alert, cooperative, NAD HEENT: AT/[place], sclera white, MMM, no nasal discharge, no pharyngeal erythema or exudate Neck: supple CV: RRR, no murmurs Pulm: CTAB, no wheezes or rales      Assessment & Plan:

## 2013-08-24 NOTE — Assessment & Plan Note (Signed)
History concerning for flu.  No signs of pneumonia. On doxy for UTI. Will start Tamiflu. Hycodan for cough. Symptomatic treatment as in AVS. F/u in 1 week if not improving.

## 2013-08-26 DIAGNOSIS — J111 Influenza due to unidentified influenza virus with other respiratory manifestations: Secondary | ICD-10-CM

## 2013-08-26 HISTORY — DX: Influenza due to unidentified influenza virus with other respiratory manifestations: J11.1

## 2013-09-07 DIAGNOSIS — M7130 Other bursal cyst, unspecified site: Secondary | ICD-10-CM | POA: Diagnosis not present

## 2013-09-07 DIAGNOSIS — M5126 Other intervertebral disc displacement, lumbar region: Secondary | ICD-10-CM | POA: Diagnosis not present

## 2013-10-08 ENCOUNTER — Encounter: Payer: Self-pay | Admitting: Family Medicine

## 2013-10-08 ENCOUNTER — Ambulatory Visit (INDEPENDENT_AMBULATORY_CARE_PROVIDER_SITE_OTHER): Payer: Medicare Other | Admitting: Family Medicine

## 2013-10-08 VITALS — BP 149/88 | HR 96 | Temp 98.1°F | Ht 60.0 in | Wt 161.0 lb

## 2013-10-08 DIAGNOSIS — Z23 Encounter for immunization: Secondary | ICD-10-CM | POA: Diagnosis not present

## 2013-10-08 DIAGNOSIS — G43009 Migraine without aura, not intractable, without status migrainosus: Secondary | ICD-10-CM

## 2013-10-08 DIAGNOSIS — M545 Low back pain, unspecified: Secondary | ICD-10-CM

## 2013-10-08 DIAGNOSIS — Z634 Disappearance and death of family member: Secondary | ICD-10-CM

## 2013-10-08 DIAGNOSIS — I10 Essential (primary) hypertension: Secondary | ICD-10-CM

## 2013-10-08 HISTORY — DX: Disappearance and death of family member: Z63.4

## 2013-10-08 MED ORDER — CITALOPRAM HYDROBROMIDE 20 MG PO TABS: 10.0000 mg | ORAL_TABLET | Freq: Every day | ORAL | Status: DC

## 2013-10-08 NOTE — Patient Instructions (Signed)
Migraines - continue Imitrex as needed, will have clinical staff look into letter from [facility]  Muscle spasm/back pain - continue nightly Flexeril as needed, continue Tramadol as needed, will have clinical staff look into prior authorization  Depressed mood - start Celexa 10 mg daily, return to office in 2-4 weeks to check mood

## 2013-10-09 DIAGNOSIS — M545 Low back pain, unspecified: Secondary | ICD-10-CM | POA: Insufficient documentation

## 2013-10-09 NOTE — Progress Notes (Signed)
   Subjective:    Patient ID: Theresa Brennan, female    DOB: Dec 30, 1945, 68 y.o.   MRN: [id number]  HPI 68 y/o female presents for follow up of multiple medical issues. She recently switched insurances to [facility] which is requiring prior authorization for Flexeril and will only give a limited quantity for Sumatriptan.  Back Pain - s/p back surgery January 2014, takes Flexeril once nightly before bed for muscle spasm, symptoms are well controlled, also takes once daily Tramadol, no leg weakness or numbness, no saddle anesthesia  Migraines - sometimes occur on back to back days and other times she will go weeks without a migraine, worse when she has a "sinus infection", takes half of 100 mg pills so that they will last her longer between refills, has previously failed treatment with Tylenol and Motrin  Hypertension - currently on Triamterene-HCTZ, states that she takes this medication for "leg swelling", tolerates medication well, leg swelling previously better controlled when she wore support hose on a daily basis, no chest pain, no vision changes, home blood pressures typically in the 120's/70's  Social-younger brother is currently in Hospice for end stage lung cancer, Theresa Brennan is having a difficult time handling the situation, she states that she is depressed and cries often, no HI, no SI, patient interested in medication for depression, previously on Prozac which she did not tolerate well   Review of Systems  Constitutional: Negative for fever, chills and fatigue.  Respiratory: Negative for cough and chest tightness.   Cardiovascular: Negative for chest pain and leg swelling.  Musculoskeletal: Positive for arthralgias and back pain.  Psychiatric/Behavioral: Negative for suicidal ideas, behavioral problems, confusion, self-injury and decreased concentration.       Objective:   Physical Exam Vitals: reviewed Gen: pleasant Caucasian female, appears mildly anxious, crying  intermittently when speaking about her brother HEENT: normocephalic, PERRL, EOMI, MMM, no pharyngeal erythema, neck supple, no adenopathy Cardiac: RRR, S1 and S2 present, no murmurs, no heaves/thrills Resp: CTAB, normal effort Ext: trace bilateral lower extremity edema, 2+ radial pulses Psych: appropriately dressed, mood is described as depressed, no SI, no HI      Assessment & Plan:  Please see problem specific assessment and plan.

## 2013-10-09 NOTE — Assessment & Plan Note (Signed)
Elevated in office however controlled based on home readings. -continue current regimen -recheck as subsequent visits -continue home monitoring

## 2013-10-09 NOTE — Assessment & Plan Note (Signed)
Patient bereaving brother who is currently in Hospice due to end stage lung cancer. Patient requested something to help her mood. -started Celexa 10 mg daily -discussed risks of medication while taking Sumatriptan.

## 2013-10-09 NOTE — Assessment & Plan Note (Signed)
Back pain/muscle spasm controlled with nightly Flexeril and Tramadol -no changes no current regimen -[employer] letter given to Nursing Staff to get prior auth.

## 2013-10-09 NOTE — Assessment & Plan Note (Signed)
Controlled on PRN Sumatriptan. Letter from [facility] given to Nursing Staff who will evaluate further if new prescription/further documentation is needed from our office.

## 2013-10-13 MED ORDER — CITALOPRAM HYDROBROMIDE 20 MG PO TABS: 10.0000 mg | ORAL_TABLET | Freq: Every day | ORAL | Status: DC

## 2013-10-13 NOTE — Addendum Note (Signed)
Addended by: Uvaldo Rising on: 10/13/2013 02:29 PM   Modules accepted: Orders

## 2013-10-14 ENCOUNTER — Telehealth: Payer: Self-pay | Admitting: *Deleted

## 2013-10-14 NOTE — Telephone Encounter (Signed)
Prior Authorization received from [facility] for Cyclobenzaprine 10 mg tab. Formulary and PA form placed in provider box for completion.  Quantity limit for Sumatriptan Succ100 mg tab also placed in PCP box for review. Clovis Pu, RN

## 2013-10-15 ENCOUNTER — Telehealth: Payer: Self-pay | Admitting: *Deleted

## 2013-10-15 NOTE — Telephone Encounter (Signed)
Informed patient that prior authorization for Flexeril submitted to [facility]. IMitrex can only be filled for 9 tablets in 30 day, per patient account she has previously been seen by neurologist/headache specialist and has failed a number of medications, will discuss in more detail at next visit.

## 2013-10-15 NOTE — Telephone Encounter (Signed)
PA form for cyclobenzaprine 10 mg tab faxed to [facility] for review.

## 2013-10-26 ENCOUNTER — Telehealth: Payer: Self-pay | Admitting: Family Medicine

## 2013-10-26 NOTE — Telephone Encounter (Signed)
Will fwd to MD for approval.  Massa Pe L, CMA  

## 2013-10-26 NOTE — Telephone Encounter (Signed)
Candice with [employer] needs a RX for [employer]. They were trying to transfer it from [facility] but it did not have a refill

## 2013-10-27 MED ORDER — CYCLOBENZAPRINE HCL 10 MG PO TABS: 10.0000 mg | ORAL_TABLET | Freq: Three times a day (TID) | ORAL | Status: DC | PRN

## 2013-10-27 NOTE — Telephone Encounter (Signed)
Called into [facility] in [place], [place], [phone].  See MD msg below.  Damyra Luscher, Darlyne Russian, CMA

## 2013-10-27 NOTE — Telephone Encounter (Signed)
Please call in Flexeril 10 mg TID PRN, Dispense #60, no refills, call in to [facility]. Thanks

## 2013-10-29 ENCOUNTER — Encounter: Payer: Self-pay | Admitting: Family Medicine

## 2013-10-29 ENCOUNTER — Ambulatory Visit (INDEPENDENT_AMBULATORY_CARE_PROVIDER_SITE_OTHER): Payer: Medicare Other | Admitting: Family Medicine

## 2013-10-29 VITALS — BP 149/87 | HR 91 | Ht 60.0 in | Wt 160.3 lb

## 2013-10-29 DIAGNOSIS — Z634 Disappearance and death of family member: Secondary | ICD-10-CM | POA: Diagnosis not present

## 2013-10-29 DIAGNOSIS — J029 Acute pharyngitis, unspecified: Secondary | ICD-10-CM | POA: Diagnosis not present

## 2013-10-29 MED ORDER — SUMATRIPTAN SUCCINATE 100 MG PO TABS: 100.0000 mg | ORAL_TABLET | Freq: Once | ORAL | Status: DC | PRN

## 2013-10-29 NOTE — Assessment & Plan Note (Signed)
One-week history of sore throat. Signs and symptoms consistent with postnasal drip from a viral illness. -Conservative management discussed including cough drops, Chloraseptic Spray, and saltwater gargles. Patient may also take Tylenol as needed for pain

## 2013-10-29 NOTE — Assessment & Plan Note (Signed)
Debridement is improved on Celexa 10 mg daily. -Continue current dose -Return to office in one month for followup -Return precautions given

## 2013-10-29 NOTE — Progress Notes (Signed)
   Subjective:    Patient ID: Theresa Brennan, female    DOB: July 31, 1946, 68 y.o.   MRN: [id number]  HPI 68 year old Caucasian female presents for followup of her recent. Patient states that her brother passed away on November 06, 2013 of lung cancer. She had been started on Celexa 10 mg daily at our last visit. She states that her mood is slightly improved on the medication, she has been surrounded by many family members over the past few weeks, she reports that she is sleeping well at this time, denies homicidal or suicidal thoughts  Patient also reports one week history of sore throat, no cough, mild rhinorrhea, no fevers or chills, unknown sick contacts, symptoms have not been resolved with salt water gargles, she has not attempted any over-the-counter cough and cold medications   Review of Systems  HENT: Positive for postnasal drip, sore throat and trouble swallowing. Negative for congestion and sinus pressure.   Respiratory: Negative for cough, choking and stridor.   Cardiovascular: Negative for chest pain.  Psychiatric/Behavioral: Negative for suicidal ideas, confusion, sleep disturbance, self-injury, decreased concentration and agitation.       Objective:   Physical Exam Vitals: Reviewed General: Pleasant Caucasian female, no acute distress HEENT: Normocephalic, bilateral TMs are pearly-gray without bulging or erythema, pupils are equal round and reactive to light, extraocular movements are intact, no scleral icterus, mild rhinorrhea, moist mucous membranes, postnasal drip present, mild pharyngeal erythema, bilateral tonsils present, no exudate, uvula midline, neck was supple, no evidence of anterior or posterior cervical lymphadenopathy Cardiac: Regular in rhythm, S1 and S2 present, no murmurs, no heaves or thrills Respiratory: Clear to auscultation bilaterally, normal effort Psych: Mood is stated as depressed however improved from previous exam, patient is appropriate dressed, no evidence of  homicidal or suicidal thoughts, no hallucinations, patient is intermittently tearful during examination       Assessment & Plan:  Please see problem specific assessment and plan.

## 2013-10-29 NOTE — Patient Instructions (Signed)
Sore Throat - continue salt water gargles, may use cough drops as needed, may use Tylenol as needed for pain  Bereavement - continue Celexa daily, return to office one month

## 2013-11-04 NOTE — Telephone Encounter (Signed)
PA for Cyclobenzaprine 10 mg tablet was denied.  Called [employer] and possible alternatives are Baclofen and Tizanidine. Clovis Pu, RN

## 2013-11-05 ENCOUNTER — Telehealth: Payer: Self-pay | Admitting: *Deleted

## 2013-11-05 NOTE — Telephone Encounter (Signed)
Discussed denial of Flexeril by [facility], patient currently has enough Flexeril to make it through the next 1.5 months, reports only taking during the evening prior to bed, reports increased back pain and insomnia when missing doses, discussed with patient that Flexeril is not a long term medication, counseled her to attempt trial of 5 mg (1/2 tablet) for the next few weeks, would like to gradually wean patient off of Flexeril, patient to return for office visit in 1 month to follow up of depression and chronic back pain, if patient still requires nightly Flexeril consider transition to Baclofen or Zanaflex.

## 2013-11-05 NOTE — Telephone Encounter (Signed)
Prior Authorization received from [facility] for Sumatriptan SUCC 100 mg tab.  PA form placed in provider box for completion. Clovis Pu, RN

## 2013-11-08 ENCOUNTER — Telehealth: Payer: Self-pay | Admitting: Family Medicine

## 2013-11-08 NOTE — Telephone Encounter (Signed)
Pt called and needs a refill on her Tramadol and Prilosec. She uses the [facility] in [place]. jw

## 2013-11-10 MED ORDER — TRAMADOL HCL 50 MG PO TABS: 50.0000 mg | ORAL_TABLET | Freq: Four times a day (QID) | ORAL | Status: DC | PRN

## 2013-11-10 MED ORDER — OMEPRAZOLE 40 MG PO CPDR: 40.0000 mg | DELAYED_RELEASE_CAPSULE | Freq: Every day | ORAL | Status: DC

## 2013-11-10 NOTE — Telephone Encounter (Signed)
Called in Tramadol to the [facility] pharmacy in [place].Jaramie Bastos, Rodena Medin

## 2013-11-10 NOTE — Telephone Encounter (Signed)
Note to nursing staff - please call in Tramadol 50 mg, dispense #50, Refill #0, thanks

## 2013-11-10 NOTE — Addendum Note (Signed)
Addended by: Jennette Bill on: 11/10/2013 04:57 PM   Modules accepted: Orders

## 2013-11-15 NOTE — Telephone Encounter (Signed)
PA. For sumatriptan SUCC 100 mg tab faxed to [facility] for review.  Clovis Pu, RN

## 2013-11-18 NOTE — Telephone Encounter (Signed)
PA for sumatriptan Succ 100 mg tab has been approved from [place].  Clovis Pu, RN

## 2013-11-23 ENCOUNTER — Encounter: Payer: Self-pay | Admitting: Family Medicine

## 2013-11-23 ENCOUNTER — Ambulatory Visit (INDEPENDENT_AMBULATORY_CARE_PROVIDER_SITE_OTHER): Payer: Medicare Other | Admitting: Family Medicine

## 2013-11-23 VITALS — BP 138/86 | HR 95 | Ht 60.0 in | Wt 156.0 lb

## 2013-11-23 DIAGNOSIS — B9789 Other viral agents as the cause of diseases classified elsewhere: Secondary | ICD-10-CM | POA: Diagnosis not present

## 2013-11-23 DIAGNOSIS — R300 Dysuria: Secondary | ICD-10-CM

## 2013-11-23 DIAGNOSIS — I10 Essential (primary) hypertension: Secondary | ICD-10-CM | POA: Diagnosis not present

## 2013-11-23 DIAGNOSIS — B349 Viral infection, unspecified: Secondary | ICD-10-CM | POA: Insufficient documentation

## 2013-11-23 LAB — POCT URINALYSIS DIPSTICK
Bilirubin, UA: NEGATIVE
Blood, UA: NEGATIVE
GLUCOSE UA: NEGATIVE
Ketones, UA: NEGATIVE
Leukocytes, UA: NEGATIVE
NITRITE UA: NEGATIVE
PROTEIN UA: NEGATIVE
SPEC GRAV UA: 1.01
UROBILINOGEN UA: 0.2
pH, UA: 6.5

## 2013-11-23 MED ORDER — FLUTICASONE PROPIONATE 50 MCG/ACT NA SUSP: 2.0000 | Freq: Every day | NASAL | Status: DC

## 2013-11-23 MED ORDER — TRAMADOL HCL 50 MG PO TABS: 50.0000 mg | ORAL_TABLET | Freq: Four times a day (QID) | ORAL | Status: DC | PRN

## 2013-11-23 NOTE — Patient Instructions (Addendum)
Sore throat/cough/congestion - continue the zinc lozenges, continue otc anti-histamine medication as needed, attempt trial of Mucinex DM, continue nasal saline drops, drink plenty fluids, start Flonase

## 2013-11-24 DIAGNOSIS — R300 Dysuria: Secondary | ICD-10-CM | POA: Insufficient documentation

## 2013-11-24 HISTORY — DX: Dysuria: R30.0

## 2013-11-24 NOTE — Progress Notes (Signed)
   Subjective:    Patient ID: Theresa Brennan, female    DOB: 02-Oct-1945, 68 y.o.   MRN: [id number]  HPI 68 y/o female presents for evaluation of cough and nasal congestion. She was evaluated on 10/29/13 for similar symptoms and diagnosed with viral illness. Conservative measures were discussed at that time. She has been using otc Benadryl as needed for nasal congestion, she has also started to take Zinc lozenges which have provided some relief of sore throat, her cough is mildly productive of sputum, no reports associated headaches and sinus pressure/ear fullness.   Patient also reports dysuria for the past 2 days, no abdominal pain, no vaginal discharge, she reports taking over the counter herbal medication that helps to prevent UTI, she reports stopping this medication a few days ago and subsequently developed dysuria, patient has a PMH of multiple UTI   Review of Systems  Constitutional: Positive for fever, chills and fatigue.  HENT: Positive for congestion, ear pain, rhinorrhea and sinus pressure. Negative for ear discharge.   Eyes: Negative for pain, discharge, redness and itching.  Respiratory: Positive for cough. Negative for apnea, choking, chest tightness and shortness of breath.   Cardiovascular: Negative for chest pain.  Gastrointestinal: Positive for nausea. Negative for vomiting, abdominal pain, diarrhea and abdominal distention.  Skin: Negative for rash.       Objective:   Physical Exam Vitals: reviewed, afebrile HEENT: normocephalic, PERRL, EOMI, no scleral icterus, rhinorrhea present, no maxillary or frontal sinus tenderness, MMM, uvula midline, no pharyngeal erythema or exudate noted, neck supple, no anterior or posterior lymphadenopathy Cardiac: RRR, S1 and S2 present, no murmurs, no heaves/thrills Resp: CTAB, normal effort Abd: soft, no tenderness, normal bowel sounds  Urine Dipstick negative for infection     Assessment & Plan:  Please see problem specific assessment  and plan.

## 2013-11-24 NOTE — Assessment & Plan Note (Signed)
Patient presents with sign/symptoms consistent with viral illness. -Will add Flonase to treatment regimen -Conservative measures discussed as outlined in the patient instruction section -She can continue otc Benadryl as needed for nasal congestion and throat lozenges for sore throat

## 2013-11-24 NOTE — Assessment & Plan Note (Signed)
Patient has two day history of dysuria. Urine dipstick negative for infection. -Patient counseled to resume otc herbal UTI medication as she has been well controlled with this supplement

## 2013-12-29 ENCOUNTER — Other Ambulatory Visit: Payer: Self-pay | Admitting: Family Medicine

## 2014-01-10 ENCOUNTER — Other Ambulatory Visit: Payer: Self-pay | Admitting: Family Medicine

## 2014-01-12 NOTE — Telephone Encounter (Signed)
Called prescription for Flexeril into pharmacy.

## 2014-01-18 DIAGNOSIS — H60399 Other infective otitis externa, unspecified ear: Secondary | ICD-10-CM | POA: Diagnosis not present

## 2014-01-24 ENCOUNTER — Telehealth: Payer: Self-pay | Admitting: Family Medicine

## 2014-01-24 NOTE — Telephone Encounter (Signed)
Pt called and would like Dr. Randolm Idol to call her to discuss her having a heart doctor. jw

## 2014-01-25 NOTE — Telephone Encounter (Signed)
Called pt. She reports that she has been seen by a cardiologist (Magod?) in 2009 and they checked her for blockage of her arteries. She would like to see a different cardiologist, because she did not like them. She does not have any current problems, just wanted to be sure.  I advised the pt to schedule OV with Dr.Fletke to discuss this. Unable to do referral without evaluation. Pt verbalized understanding and will schedule OV. Arlyss Repress

## 2014-01-31 DIAGNOSIS — H70009 Acute mastoiditis without complications, unspecified ear: Secondary | ICD-10-CM | POA: Diagnosis not present

## 2014-02-03 DIAGNOSIS — H905 Unspecified sensorineural hearing loss: Secondary | ICD-10-CM | POA: Diagnosis not present

## 2014-02-17 DIAGNOSIS — H70009 Acute mastoiditis without complications, unspecified ear: Secondary | ICD-10-CM | POA: Diagnosis not present

## 2014-03-02 ENCOUNTER — Other Ambulatory Visit: Payer: Self-pay | Admitting: Family Medicine

## 2014-03-02 NOTE — Telephone Encounter (Signed)
Pt called to check the status of her refill request. She also needs her Flexeril called in. jw

## 2014-03-03 ENCOUNTER — Encounter: Payer: Self-pay | Admitting: *Deleted

## 2014-03-04 ENCOUNTER — Telehealth: Payer: Self-pay | Admitting: Family Medicine

## 2014-03-04 NOTE — Telephone Encounter (Signed)
Called patient to inform her that Flexeril and Tramadol scripts sent to pharmacy.  Patient was curious about cardiology referral, she has had "[facility]" screening that identified possible blockages in her neck, she was previously informed that she would require an office visit prior to referral, she has not made this appointment. She now reports intermittent chest pain, las occurrence yesterday, no pain now, last 30 seconds to a minute, not associated with exertion -patient told to make appointment with Family Practice to discuss chest pain and cardiology referral  -told to go directly to ER if she has recurrence of symptoms or persistent chest pain/sob, patient expressed understanding

## 2014-03-07 ENCOUNTER — Ambulatory Visit (INDEPENDENT_AMBULATORY_CARE_PROVIDER_SITE_OTHER): Payer: Medicare Other | Admitting: Neurology

## 2014-03-07 ENCOUNTER — Encounter: Payer: Self-pay | Admitting: Neurology

## 2014-03-07 VITALS — BP 120/74 | HR 91 | Ht 60.0 in | Wt 154.6 lb

## 2014-03-07 DIAGNOSIS — G4440 Drug-induced headache, not elsewhere classified, not intractable: Secondary | ICD-10-CM

## 2014-03-07 DIAGNOSIS — R200 Anesthesia of skin: Secondary | ICD-10-CM

## 2014-03-07 DIAGNOSIS — R42 Dizziness and giddiness: Secondary | ICD-10-CM | POA: Diagnosis not present

## 2014-03-07 DIAGNOSIS — G43719 Chronic migraine without aura, intractable, without status migrainosus: Secondary | ICD-10-CM | POA: Diagnosis not present

## 2014-03-07 DIAGNOSIS — R209 Unspecified disturbances of skin sensation: Secondary | ICD-10-CM

## 2014-03-07 MED ORDER — DICLOFENAC POTASSIUM(MIGRAINE) 50 MG PO PACK: 50.0000 mg | PACK | Freq: Once | ORAL | Status: DC | PRN

## 2014-03-07 MED ORDER — TOPIRAMATE 25 MG PO TABS: 25.0000 mg | ORAL_TABLET | Freq: Every day | ORAL | Status: DC

## 2014-03-07 NOTE — Patient Instructions (Signed)
1. We will start topiramate (Topamax) 25mg  at bedtime.  Possible side effects include: impaired thinking, sedation, paresthesias (numbness and tingling) and weight loss.  It may cause dehydration and there is a small risk for kidney stones, so make sure to stay hydrated with water during the day.  There is also a very small risk for glaucoma, so if you notice any change in your vision while taking this medication, see an ophthalmologist.   2.  Stop Imitrex.  For severe headache, take Cambia (a powder).  Take on empty stomach, but may give with food if GI upset occurs.  Limit use of all pain relievers to no more than 2 days out of the week to prevent rebound headache. 3.  We will get MRI of the brain 4.  Call in 4 weeks with update and we can adjust dose of medication.  Follow up in 3 months.
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
## 2014-03-08 ENCOUNTER — Telehealth: Payer: Self-pay | Admitting: Neurology

## 2014-03-08 NOTE — Telephone Encounter (Signed)
Pt called requesting to speak to a nurse regarding her meds. Please call pt [phone]

## 2014-03-08 NOTE — Telephone Encounter (Signed)
Patients does not wish to take either of the medications you prescribed for her 03/07/14 she states the side effects are to many and the the power is to costly . Please advise

## 2014-03-09 NOTE — Telephone Encounter (Signed)
1.  I would not rule out taking a medication based on "possible" side effects.  Every medication has side effects and it doesn't mean she will get the side effects.  Topamax is one of the most used medications for chronic migraine.  If she would like to try something else, then we can try nortriptyline 10mg  at bedtime.   2.  For abortive therapy, she can continue the sumatriptan but she cannot take it more than 1 day out of the week for now, since this medication has been contributing to her chronic headaches.  We can prescribe a prednisone taper in the meantime:  She can take 10mg  tablets, 6tabs x1day, then 5tabs x1day, then 4tabs x1day, then 3tabs x1day, then 2tabs x1day, then 1tab x1day, then STOP

## 2014-03-09 NOTE — Telephone Encounter (Signed)
If she doesn't want to try a medication, we can suggest cognitive behavioral therapy or biofeedback.

## 2014-03-10 ENCOUNTER — Telehealth: Payer: Self-pay | Admitting: Neurology

## 2014-03-10 NOTE — Telephone Encounter (Signed)
Pt is returning someone call [phone]

## 2014-03-10 NOTE — Telephone Encounter (Signed)
Left message for patient to contact office regarding below message

## 2014-03-10 NOTE — Telephone Encounter (Signed)
Patient does not want to take the medications . She will contact the office about the biofeed back after discussing with her husband .

## 2014-03-14 ENCOUNTER — Other Ambulatory Visit: Payer: Self-pay | Admitting: Family Medicine

## 2014-03-14 DIAGNOSIS — G43009 Migraine without aura, not intractable, without status migrainosus: Secondary | ICD-10-CM

## 2014-03-14 MED ORDER — DICLOFENAC POTASSIUM(MIGRAINE) 50 MG PO PACK: PACK | ORAL | Status: DC

## 2014-03-14 NOTE — Assessment & Plan Note (Signed)
Seen by Neurology on 03/07/14. Increased migraines likely due to recent loss of brother and medication overuse.  -Imitrex stopped -Started on Cambia 50 mg PRN -Start Topamax at night

## 2014-03-25 ENCOUNTER — Other Ambulatory Visit: Payer: Self-pay | Admitting: Family Medicine

## 2014-03-28 ENCOUNTER — Ambulatory Visit (HOSPITAL_COMMUNITY)
Admission: RE | Admit: 2014-03-28 | Discharge: 2014-03-28 | Disposition: A | Discharge status: Home / Self Care | Payer: Medicare Other | Source: Ambulatory Visit | Attending: Neurology | Admitting: Neurology

## 2014-03-28 DIAGNOSIS — G43719 Chronic migraine without aura, intractable, without status migrainosus: Secondary | ICD-10-CM | POA: Insufficient documentation

## 2014-03-28 DIAGNOSIS — R209 Unspecified disturbances of skin sensation: Secondary | ICD-10-CM | POA: Insufficient documentation

## 2014-03-28 DIAGNOSIS — G4440 Drug-induced headache, not elsewhere classified, not intractable: Secondary | ICD-10-CM | POA: Diagnosis not present

## 2014-03-28 DIAGNOSIS — R200 Anesthesia of skin: Secondary | ICD-10-CM

## 2014-03-28 DIAGNOSIS — R51 Headache: Secondary | ICD-10-CM | POA: Diagnosis not present

## 2014-03-29 DIAGNOSIS — H70009 Acute mastoiditis without complications, unspecified ear: Secondary | ICD-10-CM | POA: Diagnosis not present

## 2014-04-05 ENCOUNTER — Other Ambulatory Visit: Payer: Self-pay | Admitting: *Deleted

## 2014-04-05 MED ORDER — SUMATRIPTAN SUCCINATE 100 MG PO TABS: ORAL_TABLET | ORAL | Status: DC

## 2014-04-11 ENCOUNTER — Telehealth: Payer: Self-pay | Admitting: *Deleted

## 2014-04-11 NOTE — Telephone Encounter (Signed)
Message copied by Fredirick Maudlin on Mon Apr 11, 2014 10:21 AM ------      Message from: JAFFE, ADAM R      Created: Sun Apr 10, 2014 10:01 AM       Mri is unremarkable.      ----- Message -----         From: Rad Results In Interface         Sent: 03/28/2014   2:41 PM           To: Cira Servant, DO                   ------

## 2014-04-11 NOTE — Telephone Encounter (Signed)
Patient is aware 

## 2014-04-22 ENCOUNTER — Other Ambulatory Visit: Payer: Self-pay | Admitting: Family Medicine

## 2014-04-22 NOTE — Telephone Encounter (Signed)
Pt called and wanted the doctor to know that the pharmacy sent in a request for her Tramadol. jw

## 2014-04-25 NOTE — Telephone Encounter (Signed)
Nursing staff - please call in Tramadol 50 mg, one tablet every 6 hours as needed for pain, dispense #50, refill #0, please also call patient to let her know that this was sent to the pharmacy

## 2014-04-26 ENCOUNTER — Other Ambulatory Visit: Payer: Self-pay | Admitting: Family Medicine

## 2014-04-26 DIAGNOSIS — R030 Elevated blood-pressure reading, without diagnosis of hypertension: Secondary | ICD-10-CM | POA: Diagnosis not present

## 2014-04-26 DIAGNOSIS — M5126 Other intervertebral disc displacement, lumbar region: Secondary | ICD-10-CM | POA: Diagnosis not present

## 2014-04-26 DIAGNOSIS — Z6830 Body mass index (BMI) 30.0-30.9, adult: Secondary | ICD-10-CM | POA: Diagnosis not present

## 2014-04-28 ENCOUNTER — Telehealth: Payer: Self-pay | Admitting: *Deleted

## 2014-04-28 NOTE — Telephone Encounter (Signed)
Patient called because the pharmacy did not receive the tramadol that was prescribed on 04/25/14. I called it into the pharmacy and notified patient.Busick, Rodena Medin

## 2014-05-04 DIAGNOSIS — M47817 Spondylosis without myelopathy or radiculopathy, lumbosacral region: Secondary | ICD-10-CM | POA: Diagnosis not present

## 2014-05-04 DIAGNOSIS — M5126 Other intervertebral disc displacement, lumbar region: Secondary | ICD-10-CM | POA: Diagnosis not present

## 2014-05-09 ENCOUNTER — Other Ambulatory Visit: Payer: Self-pay | Admitting: *Deleted

## 2014-05-09 MED ORDER — SUMATRIPTAN SUCCINATE 100 MG PO TABS: ORAL_TABLET | ORAL | Status: DC

## 2014-05-10 DIAGNOSIS — Z6830 Body mass index (BMI) 30.0-30.9, adult: Secondary | ICD-10-CM | POA: Diagnosis not present

## 2014-05-10 DIAGNOSIS — M5126 Other intervertebral disc displacement, lumbar region: Secondary | ICD-10-CM | POA: Diagnosis not present

## 2014-05-10 DIAGNOSIS — N952 Postmenopausal atrophic vaginitis: Secondary | ICD-10-CM | POA: Diagnosis not present

## 2014-05-11 ENCOUNTER — Other Ambulatory Visit: Payer: Self-pay | Admitting: *Deleted

## 2014-05-11 ENCOUNTER — Telehealth: Payer: Self-pay | Admitting: Family Medicine

## 2014-05-11 MED ORDER — CYCLOBENZAPRINE HCL 10 MG PO TABS: 10.0000 mg | ORAL_TABLET | Freq: Every day | ORAL | Status: DC

## 2014-05-11 NOTE — Telephone Encounter (Signed)
Refill request for Flexeril 

## 2014-05-11 NOTE — Telephone Encounter (Signed)
Pt informed of rx being sent in. And also scheduled for an appt. Blount, Deseree CMA

## 2014-05-11 NOTE — Telephone Encounter (Signed)
Note to nursing staff - please call patient that prescription sent to pharmacy, please schedule visit to discuss muscle spasm/pain

## 2014-05-14 DIAGNOSIS — J0100 Acute maxillary sinusitis, unspecified: Secondary | ICD-10-CM | POA: Diagnosis not present

## 2014-05-24 ENCOUNTER — Ambulatory Visit: Payer: Medicare Other | Admitting: Family Medicine

## 2014-05-26 DIAGNOSIS — M5416 Radiculopathy, lumbar region: Secondary | ICD-10-CM | POA: Diagnosis not present

## 2014-05-26 DIAGNOSIS — M5136 Other intervertebral disc degeneration, lumbar region: Secondary | ICD-10-CM | POA: Diagnosis not present

## 2014-05-26 DIAGNOSIS — M4726 Other spondylosis with radiculopathy, lumbar region: Secondary | ICD-10-CM | POA: Diagnosis not present

## 2014-05-26 DIAGNOSIS — M545 Low back pain: Secondary | ICD-10-CM | POA: Diagnosis not present

## 2014-05-31 ENCOUNTER — Ambulatory Visit: Payer: Medicare Other | Admitting: Family Medicine

## 2014-06-06 ENCOUNTER — Telehealth: Payer: Self-pay | Admitting: *Deleted

## 2014-06-06 NOTE — Telephone Encounter (Signed)
Patient out of town will call to reschedule once she gets back in town

## 2014-06-07 ENCOUNTER — Ambulatory Visit: Payer: Medicare Other | Admitting: Neurology

## 2014-06-16 ENCOUNTER — Telehealth: Payer: Self-pay | Admitting: Family Medicine

## 2014-06-16 MED ORDER — SUMATRIPTAN SUCCINATE 100 MG PO TABS: ORAL_TABLET | ORAL | Status: DC

## 2014-06-16 NOTE — Telephone Encounter (Signed)
Pt called and would like a refill on her Imitrex. She has a appointment on 11/16 to see the doctor. jw

## 2014-06-16 NOTE — Telephone Encounter (Signed)
Note to nurses - please call patient to let her know that Imitrex has been sent to pharmacy.

## 2014-06-16 NOTE — Telephone Encounter (Signed)
Patient informed, expressed understanding. 

## 2014-06-21 ENCOUNTER — Other Ambulatory Visit: Payer: Self-pay | Admitting: Family Medicine

## 2014-06-21 NOTE — Telephone Encounter (Signed)
Pt called and needs a refill on her Tramadol called in to [facility] in May. jw

## 2014-06-22 MED ORDER — TRAMADOL HCL 50 MG PO TABS: 50.0000 mg | ORAL_TABLET | Freq: Four times a day (QID) | ORAL | Status: DC | PRN

## 2014-06-22 NOTE — Telephone Encounter (Signed)
Note to nursing staff - please call in Tramadol 50 mg PO q 6 hours PRN pain, dispense #50, refill #0, thanks

## 2014-06-23 ENCOUNTER — Other Ambulatory Visit: Payer: Self-pay | Admitting: Family Medicine

## 2014-06-23 NOTE — Telephone Encounter (Signed)
Rx called in 

## 2014-07-01 ENCOUNTER — Other Ambulatory Visit: Payer: Self-pay | Admitting: Family Medicine

## 2014-07-04 NOTE — Telephone Encounter (Signed)
Note to nursing staff - please call in Flexerix 10 mg qHS, dispense #60, refill #0, thanks

## 2014-07-11 ENCOUNTER — Ambulatory Visit: Payer: Medicare Other | Admitting: Family Medicine

## 2014-07-11 ENCOUNTER — Other Ambulatory Visit: Payer: Self-pay | Admitting: Family Medicine

## 2014-07-11 NOTE — Telephone Encounter (Signed)
Pharmacy states they never received RX for Flexeril. Please resend or call in meds for patient. [facility] in [place], [place]

## 2014-07-11 NOTE — Telephone Encounter (Signed)
rx called in as written on 11/9

## 2014-07-12 NOTE — Telephone Encounter (Signed)
Spoke to patient via telephone, she missed appointment on 07/13/14. Requesting refill for Flexeril, will provide 30 day supply as has appointment in early December.   Note to nursing staff - please call pharmacy, Flexeril 10 mg QHS PRN muscle spasm, Dispense #30, Refill #0, thanks

## 2014-07-29 ENCOUNTER — Other Ambulatory Visit: Payer: Self-pay | Admitting: Family Medicine

## 2014-08-02 ENCOUNTER — Ambulatory Visit: Payer: Medicare Other | Admitting: Family Medicine

## 2014-08-02 DIAGNOSIS — J0100 Acute maxillary sinusitis, unspecified: Secondary | ICD-10-CM | POA: Diagnosis not present

## 2014-08-02 DIAGNOSIS — R300 Dysuria: Secondary | ICD-10-CM | POA: Diagnosis not present

## 2014-08-02 DIAGNOSIS — N390 Urinary tract infection, site not specified: Secondary | ICD-10-CM | POA: Diagnosis not present

## 2014-08-08 ENCOUNTER — Other Ambulatory Visit: Payer: Self-pay | Admitting: Family Medicine

## 2014-08-09 NOTE — Telephone Encounter (Signed)
Rx called in, patient informed. Appointment scheduled for 1/4 with PCP.

## 2014-08-09 NOTE — Telephone Encounter (Signed)
Note to nursing staff - please call in Tramadol 50 mg PO Q6 hrs PRN pain; please also call patient and notify her that the medication has been sent to pharmacy, this will be the last refill that I will fill for her until she is seen in office

## 2014-08-26 HISTORY — PX: ENDARTERECTOMY: SHX5162

## 2014-08-29 ENCOUNTER — Encounter: Payer: Self-pay | Admitting: Family Medicine

## 2014-08-29 ENCOUNTER — Ambulatory Visit (INDEPENDENT_AMBULATORY_CARE_PROVIDER_SITE_OTHER): Payer: Medicare Other | Admitting: Family Medicine

## 2014-08-29 VITALS — BP 143/71 | HR 86 | Temp 98.3°F | Ht 60.0 in | Wt 158.0 lb

## 2014-08-29 DIAGNOSIS — R300 Dysuria: Secondary | ICD-10-CM | POA: Diagnosis not present

## 2014-08-29 DIAGNOSIS — N390 Urinary tract infection, site not specified: Secondary | ICD-10-CM

## 2014-08-29 DIAGNOSIS — D649 Anemia, unspecified: Secondary | ICD-10-CM | POA: Diagnosis not present

## 2014-08-29 DIAGNOSIS — N3000 Acute cystitis without hematuria: Secondary | ICD-10-CM

## 2014-08-29 DIAGNOSIS — G2581 Restless legs syndrome: Secondary | ICD-10-CM | POA: Insufficient documentation

## 2014-08-29 DIAGNOSIS — M545 Low back pain, unspecified: Secondary | ICD-10-CM

## 2014-08-29 HISTORY — DX: Urinary tract infection, site not specified: N39.0

## 2014-08-29 LAB — POCT URINALYSIS DIPSTICK
Blood, UA: NEGATIVE
Spec Grav, UA: 1.01

## 2014-08-29 LAB — CBC
HEMATOCRIT: 39 % (ref 36.0–46.0)
Hemoglobin: 12.8 g/dL (ref 12.0–15.0)
MCH: 29 pg (ref 26.0–34.0)
MCHC: 32.8 g/dL (ref 30.0–36.0)
MCV: 88.2 fL (ref 78.0–100.0)
MPV: 9.4 fL (ref 8.6–12.4)
Platelets: 427 10*3/uL — ABNORMAL HIGH (ref 150–400)
RBC: 4.42 MIL/uL (ref 3.87–5.11)
RDW: 14.4 % (ref 11.5–15.5)
WBC: 6.7 10*3/uL (ref 4.0–10.5)

## 2014-08-29 LAB — BASIC METABOLIC PANEL
BUN: 9 mg/dL (ref 6–23)
CALCIUM: 9.6 mg/dL (ref 8.4–10.5)
CO2: 30 mEq/L (ref 19–32)
Chloride: 97 mEq/L (ref 96–112)
Creat: 0.63 mg/dL (ref 0.50–1.10)
GLUCOSE: 91 mg/dL (ref 70–99)
POTASSIUM: 3.5 meq/L (ref 3.5–5.3)
Sodium: 139 mEq/L (ref 135–145)

## 2014-08-29 LAB — POCT UA - MICROSCOPIC ONLY

## 2014-08-29 LAB — FERRITIN: Ferritin: 15 ng/mL (ref 10–291)

## 2014-08-29 LAB — MAGNESIUM: MAGNESIUM: 1.9 mg/dL (ref 1.5–2.5)

## 2014-08-29 MED ORDER — CIPROFLOXACIN HCL 500 MG PO TABS: 500.0000 mg | ORAL_TABLET | Freq: Two times a day (BID) | ORAL | Status: DC

## 2014-08-29 MED ORDER — SUMATRIPTAN SUCCINATE 100 MG PO TABS: ORAL_TABLET | ORAL | Status: DC

## 2014-08-29 MED ORDER — CYCLOBENZAPRINE HCL 5 MG PO TABS: 10.0000 mg | ORAL_TABLET | Freq: Every day | ORAL | Status: DC

## 2014-08-29 MED ORDER — CYCLOBENZAPRINE HCL 5 MG PO TABS: 5.0000 mg | ORAL_TABLET | Freq: Every day | ORAL | Status: DC

## 2014-08-29 NOTE — Patient Instructions (Signed)
UTI - start Cipro twice daily for 7 days, Dr. Randolm Idol will call you with your urine results  Restless leg/leg cramping - check lab work  Back pain - continue Tramadol at night, decrease dose of Flexeril to 5 mg at night

## 2014-08-30 ENCOUNTER — Telehealth: Payer: Self-pay | Admitting: Family Medicine

## 2014-08-30 DIAGNOSIS — G2581 Restless legs syndrome: Secondary | ICD-10-CM

## 2014-08-30 MED ORDER — FERROUS SULFATE 325 (65 FE) MG PO TABS: 325.0000 mg | ORAL_TABLET | Freq: Every day | ORAL | Status: DC

## 2014-08-30 NOTE — Telephone Encounter (Signed)
Discussed lab results with patient, electrolytes wnl, ferritin low normal, discussed iron supplementation vs requip, patient elected on trial of iron, will send in prescription

## 2014-08-30 NOTE — Assessment & Plan Note (Signed)
Pain controlled with current regimen. Recently had epidural injections which have improved pain. - continue nighttime Tramadol - decrease nighttime Flexeril to 5 mg (attempt to wean at further visits)

## 2014-08-30 NOTE — Assessment & Plan Note (Signed)
Discussed lab results with patient, electrolytes wnl, ferritin low normal, discussed iron supplementation vs requip, patient elected on trial of iron, will send in prescription for ferrous sulfate

## 2014-08-30 NOTE — Progress Notes (Signed)
   Subjective:    Patient ID: Theresa Brennan, female    DOB: [birth year], 69 y.o.   MRN: [id number]  HPI 69 y/o female presents for routine follow up.   Dysuria - patient reports few day history of dysuria, thinks that she may have a UTI, no fevers/chills/abdominal pain  Lumbar back pain - s/p lumbar laminectomy microdiscectomy and microscopic dissection in 02/2013, taking tramadol and flexeril at night for pain control, taking percocet 1-2 per month for severe pain (previously prescribed by neurosurgeon), had epidural injections a few months ago which has helped with pain, no leg weakness/numbness, no saddle anesthesia  Leg cramps - patient has daily muscle cramps, worse at night, thinks she may have restless leg syndrome, symptoms improved with movement and stretching  Social - recently learned that sister has cancer   Review of Systems  Constitutional: Negative for fever, chills and fatigue.  Respiratory: Negative for shortness of breath.   Cardiovascular: Negative for chest pain.  Gastrointestinal: Negative for nausea, vomiting, abdominal pain and diarrhea.  Genitourinary: Positive for dysuria.       Objective:   Physical Exam Vitals: Reviewed Gen: pleasant caucasian female, NAD Cardiac: RRR, S1 and S2 present, no murmurs, no heaves/thrills Resp: CTAB, normal effort MSK: mild bilateral lumbar paraspinal tenderness Skin: scar over lumbar spine well healed  Reviewed UA and microscopy     Assessment & Plan:  Please see problem specific assessment and plan.

## 2014-08-30 NOTE — Assessment & Plan Note (Signed)
Patient presents with dysuria. UA consistent with UTI -initiate treatment with Cipro -urine sent for culture to check sensitiviites

## 2014-08-30 NOTE — Assessment & Plan Note (Signed)
Patient presents with cramping of LE consistent with RLS. -check BMP, Mg, Ferritin, CBC -encouraged daily exercise and stretching

## 2014-08-31 LAB — URINE CULTURE: Colony Count: >=100000

## 2014-09-02 DIAGNOSIS — H1840 Unspecified corneal degeneration: Secondary | ICD-10-CM | POA: Diagnosis not present

## 2014-09-02 DIAGNOSIS — H179 Unspecified corneal scar and opacity: Secondary | ICD-10-CM | POA: Diagnosis not present

## 2014-09-02 DIAGNOSIS — H5203 Hypermetropia, bilateral: Secondary | ICD-10-CM | POA: Diagnosis not present

## 2014-09-02 DIAGNOSIS — H25813 Combined forms of age-related cataract, bilateral: Secondary | ICD-10-CM | POA: Diagnosis not present

## 2014-09-02 DIAGNOSIS — H52223 Regular astigmatism, bilateral: Secondary | ICD-10-CM | POA: Diagnosis not present

## 2014-09-14 ENCOUNTER — Telehealth: Payer: Self-pay | Admitting: Family Medicine

## 2014-09-14 MED ORDER — CEPHALEXIN 500 MG PO CAPS: 500.0000 mg | ORAL_CAPSULE | Freq: Two times a day (BID) | ORAL | Status: DC

## 2014-09-14 NOTE — Telephone Encounter (Signed)
Pt called and said that her bladder infection is back and wanted to know if the doctor could call in something for this. jw

## 2014-09-14 NOTE — Telephone Encounter (Signed)
Reviewed past prescriptions, has been prescribed Keflex and other Cephalosporins in the past and tolerated.

## 2014-09-14 NOTE — Telephone Encounter (Signed)
Patient recently treated for UTI with Cipro, culture grew Klebsiella sensitive to Cipro., patient had some improvement of symptoms however reports return of dysuria, some vaginal odor but no discharge, will send in prescription for Keflex (has penicillin allergy which she describes as blistering of tongue/lips, no anaphylaxis, thinks that she has tolerated Keflex in the past)

## 2014-09-23 ENCOUNTER — Other Ambulatory Visit: Payer: Self-pay | Admitting: Family Medicine

## 2014-09-27 DIAGNOSIS — J320 Chronic maxillary sinusitis: Secondary | ICD-10-CM | POA: Diagnosis not present

## 2014-09-27 DIAGNOSIS — H6062 Unspecified chronic otitis externa, left ear: Secondary | ICD-10-CM | POA: Diagnosis not present

## 2014-09-27 DIAGNOSIS — H6122 Impacted cerumen, left ear: Secondary | ICD-10-CM | POA: Diagnosis not present

## 2014-09-27 DIAGNOSIS — J040 Acute laryngitis: Secondary | ICD-10-CM | POA: Diagnosis not present

## 2014-09-27 DIAGNOSIS — J322 Chronic ethmoidal sinusitis: Secondary | ICD-10-CM | POA: Diagnosis not present

## 2014-09-29 ENCOUNTER — Other Ambulatory Visit: Payer: Self-pay | Admitting: Family Medicine

## 2014-09-30 NOTE — Telephone Encounter (Signed)
Note to nursing staff - please call in tramadol 50 mg Q8 hours prn pain, dispense #50, refill #0, thanks

## 2014-10-03 NOTE — Telephone Encounter (Signed)
Rx called into [facility] in [place], [place].

## 2014-10-06 DIAGNOSIS — H7101 Cholesteatoma of attic, right ear: Secondary | ICD-10-CM | POA: Diagnosis not present

## 2014-10-06 DIAGNOSIS — J322 Chronic ethmoidal sinusitis: Secondary | ICD-10-CM | POA: Diagnosis not present

## 2014-10-06 DIAGNOSIS — J320 Chronic maxillary sinusitis: Secondary | ICD-10-CM | POA: Diagnosis not present

## 2014-10-06 DIAGNOSIS — H7011 Chronic mastoiditis, right ear: Secondary | ICD-10-CM | POA: Diagnosis not present

## 2014-10-24 DIAGNOSIS — R300 Dysuria: Secondary | ICD-10-CM | POA: Diagnosis not present

## 2014-10-24 DIAGNOSIS — Z6831 Body mass index (BMI) 31.0-31.9, adult: Secondary | ICD-10-CM | POA: Diagnosis not present

## 2014-10-24 DIAGNOSIS — N3000 Acute cystitis without hematuria: Secondary | ICD-10-CM | POA: Diagnosis not present

## 2014-10-25 DIAGNOSIS — H18411 Arcus senilis, right eye: Secondary | ICD-10-CM | POA: Diagnosis not present

## 2014-10-25 DIAGNOSIS — H43812 Vitreous degeneration, left eye: Secondary | ICD-10-CM | POA: Diagnosis not present

## 2014-10-25 DIAGNOSIS — H2511 Age-related nuclear cataract, right eye: Secondary | ICD-10-CM | POA: Diagnosis not present

## 2014-10-25 DIAGNOSIS — H02839 Dermatochalasis of unspecified eye, unspecified eyelid: Secondary | ICD-10-CM | POA: Diagnosis not present

## 2014-10-31 ENCOUNTER — Other Ambulatory Visit: Payer: Self-pay | Admitting: Family Medicine

## 2014-10-31 NOTE — Telephone Encounter (Signed)
Rx called in 

## 2014-10-31 NOTE — Telephone Encounter (Signed)
Nursing staff - please call in flexeril 10 mg prn pain QHS, dispense #60, refill #0

## 2014-11-11 ENCOUNTER — Telehealth: Payer: Self-pay | Admitting: Family Medicine

## 2014-11-11 NOTE — Telephone Encounter (Signed)
Pt is checking status of referral to the heart doctor

## 2014-11-14 DIAGNOSIS — H179 Unspecified corneal scar and opacity: Secondary | ICD-10-CM | POA: Diagnosis not present

## 2014-11-14 DIAGNOSIS — H25041 Posterior subcapsular polar age-related cataract, right eye: Secondary | ICD-10-CM | POA: Diagnosis not present

## 2014-11-14 DIAGNOSIS — H2512 Age-related nuclear cataract, left eye: Secondary | ICD-10-CM | POA: Diagnosis not present

## 2014-11-14 DIAGNOSIS — H43812 Vitreous degeneration, left eye: Secondary | ICD-10-CM | POA: Diagnosis not present

## 2014-11-15 ENCOUNTER — Other Ambulatory Visit: Payer: Self-pay | Admitting: Family Medicine

## 2014-11-21 NOTE — Telephone Encounter (Signed)
Pt scheduled for an appt. Yuna Pizzolato, CMA  

## 2014-11-21 NOTE — Telephone Encounter (Signed)
Note to nursing staff - please have patient make appointment to discuss cardiology referral, thanks

## 2014-11-22 ENCOUNTER — Other Ambulatory Visit: Payer: Self-pay | Admitting: Family Medicine

## 2014-11-23 ENCOUNTER — Encounter (HOSPITAL_BASED_OUTPATIENT_CLINIC_OR_DEPARTMENT_OTHER): Payer: Self-pay

## 2014-11-23 ENCOUNTER — Emergency Department (HOSPITAL_BASED_OUTPATIENT_CLINIC_OR_DEPARTMENT_OTHER)
Admission: EM | Admit: 2014-11-23 | Discharge: 2014-11-23 | Disposition: A | Discharge status: Home / Self Care | Payer: Medicare Other | Attending: Emergency Medicine | Admitting: Emergency Medicine

## 2014-11-23 DIAGNOSIS — I10 Essential (primary) hypertension: Secondary | ICD-10-CM | POA: Insufficient documentation

## 2014-11-23 DIAGNOSIS — R300 Dysuria: Secondary | ICD-10-CM | POA: Diagnosis present

## 2014-11-23 DIAGNOSIS — Z8611 Personal history of tuberculosis: Secondary | ICD-10-CM | POA: Insufficient documentation

## 2014-11-23 DIAGNOSIS — Z79899 Other long term (current) drug therapy: Secondary | ICD-10-CM | POA: Insufficient documentation

## 2014-11-23 DIAGNOSIS — Z880 Allergy status to penicillin: Secondary | ICD-10-CM | POA: Diagnosis not present

## 2014-11-23 DIAGNOSIS — Z862 Personal history of diseases of the blood and blood-forming organs and certain disorders involving the immune mechanism: Secondary | ICD-10-CM | POA: Diagnosis not present

## 2014-11-23 DIAGNOSIS — K219 Gastro-esophageal reflux disease without esophagitis: Secondary | ICD-10-CM | POA: Insufficient documentation

## 2014-11-23 DIAGNOSIS — Z7982 Long term (current) use of aspirin: Secondary | ICD-10-CM | POA: Insufficient documentation

## 2014-11-23 DIAGNOSIS — R011 Cardiac murmur, unspecified: Secondary | ICD-10-CM | POA: Diagnosis not present

## 2014-11-23 DIAGNOSIS — H269 Unspecified cataract: Secondary | ICD-10-CM | POA: Insufficient documentation

## 2014-11-23 DIAGNOSIS — Z792 Long term (current) use of antibiotics: Secondary | ICD-10-CM | POA: Insufficient documentation

## 2014-11-23 DIAGNOSIS — Z9889 Other specified postprocedural states: Secondary | ICD-10-CM | POA: Insufficient documentation

## 2014-11-23 DIAGNOSIS — Z7951 Long term (current) use of inhaled steroids: Secondary | ICD-10-CM | POA: Insufficient documentation

## 2014-11-23 DIAGNOSIS — N390 Urinary tract infection, site not specified: Secondary | ICD-10-CM | POA: Insufficient documentation

## 2014-11-23 DIAGNOSIS — Z8742 Personal history of other diseases of the female genital tract: Secondary | ICD-10-CM | POA: Insufficient documentation

## 2014-11-23 DIAGNOSIS — M1990 Unspecified osteoarthritis, unspecified site: Secondary | ICD-10-CM | POA: Diagnosis not present

## 2014-11-23 DIAGNOSIS — Z87891 Personal history of nicotine dependence: Secondary | ICD-10-CM | POA: Insufficient documentation

## 2014-11-23 DIAGNOSIS — Z793 Long term (current) use of hormonal contraceptives: Secondary | ICD-10-CM | POA: Diagnosis not present

## 2014-11-23 DIAGNOSIS — K259 Gastric ulcer, unspecified as acute or chronic, without hemorrhage or perforation: Secondary | ICD-10-CM | POA: Diagnosis not present

## 2014-11-23 DIAGNOSIS — Z87828 Personal history of other (healed) physical injury and trauma: Secondary | ICD-10-CM | POA: Insufficient documentation

## 2014-11-23 LAB — URINALYSIS, ROUTINE W REFLEX MICROSCOPIC
Glucose, UA: NEGATIVE mg/dL
KETONES UR: NEGATIVE mg/dL
Nitrite: NEGATIVE
PH: 6 (ref 5.0–8.0)
PROTEIN: NEGATIVE mg/dL
SPECIFIC GRAVITY, URINE: 1.022 (ref 1.005–1.030)
Urobilinogen, UA: 0.2 mg/dL (ref 0.0–1.0)

## 2014-11-23 LAB — URINE MICROSCOPIC-ADD ON

## 2014-11-23 MED ORDER — CEPHALEXIN 250 MG PO CAPS
500.0000 mg | ORAL_CAPSULE | Freq: Once | ORAL | Status: AC
  Administered 2014-11-23: 500 mg via ORAL
  Filled 2014-11-23: qty 2

## 2014-11-23 MED ORDER — PHENAZOPYRIDINE HCL 200 MG PO TABS: 200.0000 mg | ORAL_TABLET | Freq: Three times a day (TID) | ORAL | Status: DC

## 2014-11-23 MED ORDER — CEPHALEXIN 500 MG PO CAPS: 500.0000 mg | ORAL_CAPSULE | Freq: Two times a day (BID) | ORAL | Status: DC

## 2014-11-23 MED ORDER — ACETAMINOPHEN-CODEINE #3 300-30 MG PO TABS
1.0000 | ORAL_TABLET | Freq: Once | ORAL | Status: AC
  Administered 2014-11-23: 1 via ORAL
  Filled 2014-11-23: qty 1

## 2014-11-23 MED ORDER — ONDANSETRON 4 MG PO TBDP: 4.0000 mg | ORAL_TABLET | Freq: Three times a day (TID) | ORAL | Status: DC | PRN

## 2014-11-23 MED ORDER — ONDANSETRON 4 MG PO TBDP
4.0000 mg | ORAL_TABLET | Freq: Once | ORAL | Status: AC
  Administered 2014-11-23: 4 mg via ORAL
  Filled 2014-11-23: qty 1

## 2014-11-23 MED ORDER — ACETAMINOPHEN-CODEINE #3 300-30 MG PO TABS: 1.0000 | ORAL_TABLET | Freq: Four times a day (QID) | ORAL | Status: DC | PRN
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
## 2014-11-23 NOTE — Discharge Instructions (Signed)
Please see your doctor in 1 week. Take the meds prescribed.  Please return to the ER if your symptoms worsen; you have increased pain, fevers, chills, inability to keep any medications down, confusion. Otherwise see the outpatient doctor as requested.   Urinary Tract Infection Urinary tract infections (UTIs) can develop anywhere along your urinary tract. Your urinary tract is your body's drainage system for removing wastes and extra water. Your urinary tract includes two kidneys, two ureters, a bladder, and a urethra. Your kidneys are a pair of bean-shaped organs. Each kidney is about the size of your fist. They are located below your ribs, one on each side of your spine. CAUSES Infections are caused by microbes, which are microscopic organisms, including fungi, viruses, and bacteria. These organisms are so small that they can only be seen through a microscope. Bacteria are the microbes that most commonly cause UTIs. SYMPTOMS  Symptoms of UTIs may vary by age and gender of the patient and by the location of the infection. Symptoms in young women typically include a frequent and intense urge to urinate and a painful, burning feeling in the bladder or urethra during urination. Older women and men are more likely to be tired, shaky, and weak and have muscle aches and abdominal pain. A fever may mean the infection is in your kidneys. Other symptoms of a kidney infection include pain in your back or sides below the ribs, nausea, and vomiting. DIAGNOSIS To diagnose a UTI, your caregiver will ask you about your symptoms. Your caregiver also will ask to provide a urine sample. The urine sample will be tested for bacteria and white blood cells. White blood cells are made by your body to help fight infection. TREATMENT  Typically, UTIs can be treated with medication. Because most UTIs are caused by a bacterial infection, they usually can be treated with the use of antibiotics. The choice of antibiotic and  length of treatment depend on your symptoms and the type of bacteria causing your infection. HOME CARE INSTRUCTIONS  If you were prescribed antibiotics, take them exactly as your caregiver instructs you. Finish the medication even if you feel better after you have only taken some of the medication.  Drink enough water and fluids to keep your urine clear or pale yellow.  Avoid caffeine, tea, and carbonated beverages. They tend to irritate your bladder.  Empty your bladder often. Avoid holding urine for long periods of time.  Empty your bladder before and after sexual intercourse.  After a bowel movement, women should cleanse from front to back. Use each tissue only once. SEEK MEDICAL CARE IF:   You have back pain.  You develop a fever.  Your symptoms do not begin to resolve within 3 days. SEEK IMMEDIATE MEDICAL CARE IF:   You have severe back pain or lower abdominal pain.  You develop chills.  You have nausea or vomiting.  You have continued burning or discomfort with urination. MAKE SURE YOU:   Understand these instructions.  Will watch your condition.  Will get help right away if you are not doing well or get worse. Document Released: 05/22/2005 Document Revised: 02/11/2012 Document Reviewed: 09/20/2011 [facility] Patient Information 2015 [place], [place]. This information is not intended to replace advice given to you by your health care provider. Make sure you discuss any questions you have with your health care provider.

## 2014-11-23 NOTE — ED Notes (Signed)
C/o dysuria and bladder spasm x today

## 2014-11-28 ENCOUNTER — Ambulatory Visit: Payer: Medicare Other | Admitting: Family Medicine

## 2014-12-05 ENCOUNTER — Ambulatory Visit (HOSPITAL_COMMUNITY)
Admission: RE | Admit: 2014-12-05 | Discharge: 2014-12-05 | Disposition: A | Discharge status: Home / Self Care | Payer: Medicare Other | Source: Ambulatory Visit | Attending: Family Medicine | Admitting: Family Medicine

## 2014-12-05 ENCOUNTER — Ambulatory Visit (INDEPENDENT_AMBULATORY_CARE_PROVIDER_SITE_OTHER): Payer: Medicare Other | Admitting: Family Medicine

## 2014-12-05 ENCOUNTER — Encounter: Payer: Self-pay | Admitting: Family Medicine

## 2014-12-05 ENCOUNTER — Other Ambulatory Visit (HOSPITAL_COMMUNITY): Payer: Self-pay | Admitting: Family Medicine

## 2014-12-05 ENCOUNTER — Other Ambulatory Visit: Payer: Self-pay | Admitting: Family Medicine

## 2014-12-05 VITALS — BP 127/76 | HR 88 | Temp 98.1°F | Ht 60.0 in | Wt 161.0 lb

## 2014-12-05 DIAGNOSIS — M7989 Other specified soft tissue disorders: Secondary | ICD-10-CM | POA: Diagnosis not present

## 2014-12-05 DIAGNOSIS — R6883 Chills (without fever): Secondary | ICD-10-CM | POA: Diagnosis not present

## 2014-12-05 DIAGNOSIS — N3010 Interstitial cystitis (chronic) without hematuria: Secondary | ICD-10-CM | POA: Diagnosis not present

## 2014-12-05 DIAGNOSIS — I6529 Occlusion and stenosis of unspecified carotid artery: Secondary | ICD-10-CM

## 2014-12-05 DIAGNOSIS — R300 Dysuria: Secondary | ICD-10-CM

## 2014-12-05 DIAGNOSIS — R9431 Abnormal electrocardiogram [ECG] [EKG]: Secondary | ICD-10-CM | POA: Insufficient documentation

## 2014-12-05 DIAGNOSIS — R079 Chest pain, unspecified: Secondary | ICD-10-CM | POA: Insufficient documentation

## 2014-12-05 DIAGNOSIS — H269 Unspecified cataract: Secondary | ICD-10-CM

## 2014-12-05 LAB — LIPID PANEL
Cholesterol: 225 mg/dL — ABNORMAL HIGH (ref 0–200)
HDL: 47 mg/dL (ref >=46)
LDL Cholesterol: 138 mg/dL — ABNORMAL HIGH (ref 0–99)
TRIGLYCERIDES: 200 mg/dL — AB (ref <150)
Total CHOL/HDL Ratio: 4.8 Ratio
VLDL: 40 mg/dL (ref 0–40)

## 2014-12-05 LAB — POCT URINALYSIS DIPSTICK
Bilirubin, UA: NEGATIVE
Blood, UA: NEGATIVE
Glucose, UA: NEGATIVE
Ketones, UA: NEGATIVE
Leukocytes, UA: NEGATIVE
Nitrite, UA: NEGATIVE
Protein, UA: NEGATIVE
Spec Grav, UA: 1.015
Urobilinogen, UA: 0.2
pH, UA: 7

## 2014-12-05 LAB — TSH: TSH: 6.577 u[IU]/mL — AB (ref 0.350–4.500)
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
## 2014-12-05 NOTE — Patient Instructions (Signed)
It was nice to see you.   You have been referred to Cardiology. My office will contact you in the next few weeks.   My nurse will schedule the carotid US.

## 2014-12-05 NOTE — Assessment & Plan Note (Signed)
Chronic leg swelling. Recent lab work identified normal renal function and CBC. Patient referred to cardiology today for chest pain and will likely get cardiac imaging though Cardiology. Will also check TSH. Favor venous insufficiency.

## 2014-12-05 NOTE — Assessment & Plan Note (Signed)
Patient presents for evaluation of chest pain with both typical and atypical symptoms. No current chest pain. EKG showed new Twave inversion in leads V3-V6. -referral to cardiology made -patient to continue current asa 81 mg -check lipid profile -patient also requests carotid doppler (see note for details), although no bruits and no neurologic symptoms patient is adement that she get a carotid US, I counseled her that it is not common practice to order screening carotid dopplers however given her FH would order today

## 2014-12-05 NOTE — Assessment & Plan Note (Signed)
Patient recently treated for UTI at urgent care. Patient has improvement of symptoms however still has some dysuria and bladder spasm. -POC UA negative for infection -suspect symptoms are related to interstitial cystitis -encouraged follow up with Urologist

## 2014-12-06 ENCOUNTER — Telehealth: Payer: Self-pay | Admitting: Family Medicine

## 2014-12-06 LAB — T3: T3 TOTAL: 145.9 ng/dL (ref 80.0–204.0)

## 2014-12-06 LAB — T4: T4 TOTAL: 10 ug/dL (ref 4.5–12.0)

## 2014-12-06 NOTE — Telephone Encounter (Signed)
Lab work showed abnormal TSH, spoke to lab and will add on T3/T4. Will discuss results with patient once I have them.

## 2014-12-07 ENCOUNTER — Encounter: Payer: Self-pay | Admitting: Family Medicine

## 2014-12-07 ENCOUNTER — Telehealth: Payer: Self-pay | Admitting: Family Medicine

## 2014-12-07 DIAGNOSIS — E039 Hypothyroidism, unspecified: Secondary | ICD-10-CM | POA: Insufficient documentation

## 2014-12-07 DIAGNOSIS — E038 Other specified hypothyroidism: Secondary | ICD-10-CM

## 2014-12-07 DIAGNOSIS — E785 Hyperlipidemia, unspecified: Secondary | ICD-10-CM

## 2014-12-07 MED ORDER — LEVOTHYROXINE SODIUM 50 MCG PO TABS: 50.0000 ug | ORAL_TABLET | Freq: Every day | ORAL | Status: DC

## 2014-12-07 MED ORDER — ATORVASTATIN CALCIUM 40 MG PO TABS: 40.0000 mg | ORAL_TABLET | Freq: Every day | ORAL | Status: DC

## 2014-12-07 NOTE — Assessment & Plan Note (Signed)
ASCVD 12.3% and patient is undergoing current evaluation for intermittent chest pain.  -start Lipitor 40 mg daily

## 2014-12-07 NOTE — Assessment & Plan Note (Addendum)
Lab work identified subclinical hypothyroidism. Patient has symptoms of hypothyroidism including weight gain/constipation/dry skin.  -start Synthroid 50 mcg daily -recheck TSH/T3/T4 in 6-8 weeks

## 2014-12-07 NOTE — Telephone Encounter (Signed)
Discussed elevated cholesterol and subclinical hypothyroidism via telephone. Started Lipitor and Synthroid.

## 2014-12-09 ENCOUNTER — Telehealth: Payer: Self-pay | Admitting: Family Medicine

## 2014-12-09 MED ORDER — SUMATRIPTAN SUCCINATE 100 MG PO TABS: ORAL_TABLET | ORAL | Status: DC

## 2014-12-09 NOTE — Telephone Encounter (Signed)
Refilled. Quantity 9. Refill #1

## 2014-12-09 NOTE — Telephone Encounter (Signed)
Prior Authorization received from [facility] pharmacy for Sumatriptan Succ 100 mg. Formulary and PA form placed in provider box for completion. There is a quantity limit of 9 tablets per thirty days.  Quantity ordered 13.   Clovis Pu, RN

## 2014-12-09 NOTE — Telephone Encounter (Signed)
Theresa Brennan is needing a refill on her Imitrex.  Only have 1 tablet left.  Prior authorization needed for medication before filling.  Send to [facility] in [place] on [address].

## 2014-12-12 ENCOUNTER — Telehealth: Payer: Self-pay | Admitting: Family Medicine

## 2014-12-12 NOTE — Telephone Encounter (Signed)
Pt calling again because insurance will not pay for Imitrex and needs PCP to contact insurance. Says that she is having horrible migraines and doesn't know what to do / Theresa Brennan, ASA

## 2014-12-12 NOTE — Telephone Encounter (Signed)
Pt called because [facility] said that we haven't sent in a prescription for her Imitrex. She would also like to speak to the doctor about another medication that is causing her to have more headaches. jw

## 2014-12-12 NOTE — Telephone Encounter (Signed)
Issue taken care of. Patient has refill at pharmacy. Her husband is gong to pick it up today.

## 2014-12-15 ENCOUNTER — Ambulatory Visit (HOSPITAL_COMMUNITY)
Admission: RE | Admit: 2014-12-15 | Discharge: 2014-12-15 | Disposition: A | Discharge status: Home / Self Care | Payer: Medicare Other | Source: Ambulatory Visit | Attending: Family Medicine | Admitting: Family Medicine

## 2014-12-15 ENCOUNTER — Telehealth: Payer: Self-pay | Admitting: *Deleted

## 2014-12-15 DIAGNOSIS — I6523 Occlusion and stenosis of bilateral carotid arteries: Secondary | ICD-10-CM | POA: Insufficient documentation

## 2014-12-15 DIAGNOSIS — I6529 Occlusion and stenosis of unspecified carotid artery: Secondary | ICD-10-CM | POA: Diagnosis not present

## 2014-12-15 NOTE — Telephone Encounter (Signed)
Report called for carotid.  Pt has right internal carotid artery with significant narrowing within 80-99% blockage.  Please see report in Epic.  Clovis Pu, RN

## 2014-12-15 NOTE — Progress Notes (Signed)
VASCULAR LAB PRELIMINARY  PRELIMINARY  PRELIMINARY  PRELIMINARY  Carotid Duplex completed.    Preliminary report:  Carotid Duplex study reveals significant narrowing of the proximal right ICA with elevated velocities of 560/167 cm/sec..  This falls within the range of 80-99% stenosis. Left ICA velocities within the 40-59% range of stenosis.    Vertebral arteries are patent and antegrade bilaterally.   Preliminary results called to Dr. Versie Starks office.  Spoke to [employer].  Loralie Champagne, RVT 12/15/2014, 11:43 AM

## 2014-12-16 DIAGNOSIS — I6529 Occlusion and stenosis of unspecified carotid artery: Secondary | ICD-10-CM | POA: Insufficient documentation

## 2014-12-16 NOTE — Assessment & Plan Note (Signed)
Significant narrowing of right ICA -referral to vascular surgery

## 2014-12-16 NOTE — Telephone Encounter (Signed)
Discussed carotid doppler results with patient. 80-99% stenosis of right ICA and 40-59% stenosis of the left ICA. Will refer to vascular surgery.

## 2014-12-20 DIAGNOSIS — H2512 Age-related nuclear cataract, left eye: Secondary | ICD-10-CM | POA: Diagnosis not present

## 2014-12-21 ENCOUNTER — Other Ambulatory Visit: Payer: Self-pay | Admitting: *Deleted

## 2014-12-21 DIAGNOSIS — I6521 Occlusion and stenosis of right carotid artery: Secondary | ICD-10-CM

## 2014-12-21 DIAGNOSIS — H2511 Age-related nuclear cataract, right eye: Secondary | ICD-10-CM | POA: Diagnosis not present

## 2014-12-22 DIAGNOSIS — H25012 Cortical age-related cataract, left eye: Secondary | ICD-10-CM | POA: Diagnosis not present

## 2014-12-27 ENCOUNTER — Other Ambulatory Visit: Payer: Self-pay | Admitting: Family Medicine

## 2014-12-27 DIAGNOSIS — H2511 Age-related nuclear cataract, right eye: Secondary | ICD-10-CM | POA: Diagnosis not present

## 2014-12-29 DIAGNOSIS — H25011 Cortical age-related cataract, right eye: Secondary | ICD-10-CM | POA: Diagnosis not present

## 2015-01-02 ENCOUNTER — Observation Stay (HOSPITAL_COMMUNITY)
Admission: EM | Admit: 2015-01-02 | Discharge: 2015-01-04 | Disposition: A | Discharge status: Home / Self Care | Payer: Medicare Other | Attending: Family Medicine | Admitting: Family Medicine

## 2015-01-02 ENCOUNTER — Emergency Department (HOSPITAL_COMMUNITY): Payer: Medicare Other

## 2015-01-02 ENCOUNTER — Encounter (HOSPITAL_COMMUNITY): Payer: Self-pay | Admitting: Emergency Medicine

## 2015-01-02 DIAGNOSIS — Z87440 Personal history of urinary (tract) infections: Secondary | ICD-10-CM | POA: Diagnosis not present

## 2015-01-02 DIAGNOSIS — K219 Gastro-esophageal reflux disease without esophagitis: Secondary | ICD-10-CM | POA: Diagnosis not present

## 2015-01-02 DIAGNOSIS — Z888 Allergy status to other drugs, medicaments and biological substances status: Secondary | ICD-10-CM | POA: Diagnosis not present

## 2015-01-02 DIAGNOSIS — Z90710 Acquired absence of both cervix and uterus: Secondary | ICD-10-CM | POA: Diagnosis not present

## 2015-01-02 DIAGNOSIS — I6529 Occlusion and stenosis of unspecified carotid artery: Secondary | ICD-10-CM | POA: Diagnosis present

## 2015-01-02 DIAGNOSIS — E039 Hypothyroidism, unspecified: Secondary | ICD-10-CM | POA: Insufficient documentation

## 2015-01-02 DIAGNOSIS — Z881 Allergy status to other antibiotic agents status: Secondary | ICD-10-CM | POA: Diagnosis not present

## 2015-01-02 DIAGNOSIS — Z880 Allergy status to penicillin: Secondary | ICD-10-CM | POA: Insufficient documentation

## 2015-01-02 DIAGNOSIS — D649 Anemia, unspecified: Secondary | ICD-10-CM | POA: Diagnosis not present

## 2015-01-02 DIAGNOSIS — R079 Chest pain, unspecified: Secondary | ICD-10-CM | POA: Insufficient documentation

## 2015-01-02 DIAGNOSIS — M479 Spondylosis, unspecified: Secondary | ICD-10-CM | POA: Insufficient documentation

## 2015-01-02 DIAGNOSIS — I6521 Occlusion and stenosis of right carotid artery: Secondary | ICD-10-CM | POA: Diagnosis not present

## 2015-01-02 DIAGNOSIS — Z7982 Long term (current) use of aspirin: Secondary | ICD-10-CM | POA: Diagnosis not present

## 2015-01-02 DIAGNOSIS — E785 Hyperlipidemia, unspecified: Secondary | ICD-10-CM | POA: Insufficient documentation

## 2015-01-02 DIAGNOSIS — Z9841 Cataract extraction status, right eye: Secondary | ICD-10-CM | POA: Insufficient documentation

## 2015-01-02 DIAGNOSIS — Z87891 Personal history of nicotine dependence: Secondary | ICD-10-CM | POA: Diagnosis not present

## 2015-01-02 DIAGNOSIS — I10 Essential (primary) hypertension: Secondary | ICD-10-CM | POA: Diagnosis not present

## 2015-01-02 DIAGNOSIS — G43009 Migraine without aura, not intractable, without status migrainosus: Secondary | ICD-10-CM | POA: Diagnosis not present

## 2015-01-02 DIAGNOSIS — R0789 Other chest pain: Secondary | ICD-10-CM | POA: Diagnosis not present

## 2015-01-02 DIAGNOSIS — M8580 Other specified disorders of bone density and structure, unspecified site: Secondary | ICD-10-CM | POA: Insufficient documentation

## 2015-01-02 DIAGNOSIS — I200 Unstable angina: Principal | ICD-10-CM | POA: Insufficient documentation

## 2015-01-02 DIAGNOSIS — I4510 Unspecified right bundle-branch block: Secondary | ICD-10-CM | POA: Diagnosis not present

## 2015-01-02 DIAGNOSIS — G2581 Restless legs syndrome: Secondary | ICD-10-CM | POA: Diagnosis not present

## 2015-01-02 DIAGNOSIS — M79606 Pain in leg, unspecified: Secondary | ICD-10-CM | POA: Diagnosis not present

## 2015-01-02 DIAGNOSIS — K589 Irritable bowel syndrome without diarrhea: Secondary | ICD-10-CM | POA: Diagnosis not present

## 2015-01-02 DIAGNOSIS — Z885 Allergy status to narcotic agent status: Secondary | ICD-10-CM | POA: Insufficient documentation

## 2015-01-02 DIAGNOSIS — E876 Hypokalemia: Secondary | ICD-10-CM | POA: Insufficient documentation

## 2015-01-02 DIAGNOSIS — Z9049 Acquired absence of other specified parts of digestive tract: Secondary | ICD-10-CM | POA: Insufficient documentation

## 2015-01-02 DIAGNOSIS — M160 Bilateral primary osteoarthritis of hip: Secondary | ICD-10-CM | POA: Diagnosis not present

## 2015-01-02 LAB — BASIC METABOLIC PANEL
Anion gap: 11 (ref 5–15)
BUN: 8 mg/dL (ref 6–20)
CO2: 30 mmol/L (ref 22–32)
CREATININE: 0.67 mg/dL (ref 0.44–1.00)
Calcium: 9.3 mg/dL (ref 8.9–10.3)
Chloride: 97 mmol/L — ABNORMAL LOW (ref 101–111)
GFR calc non Af Amer: >60 mL/min (ref >60)
GLUCOSE: 118 mg/dL — AB (ref 70–99)
Potassium: 2.8 mmol/L — ABNORMAL LOW (ref 3.5–5.1)
Sodium: 138 mmol/L (ref 135–145)

## 2015-01-02 LAB — CBC WITH DIFFERENTIAL/PLATELET
Basophils Absolute: 0 10*3/uL (ref 0.0–0.1)
Basophils Relative: 0 % (ref 0–1)
EOS ABS: 0.1 10*3/uL (ref 0.0–0.7)
Eosinophils Relative: 1 % (ref 0–5)
HCT: 40.8 % (ref 36.0–46.0)
Hemoglobin: 13.6 g/dL (ref 12.0–15.0)
Lymphocytes Relative: 31 % (ref 12–46)
Lymphs Abs: 2.9 10*3/uL (ref 0.7–4.0)
MCH: 29.6 pg (ref 26.0–34.0)
MCHC: 33.3 g/dL (ref 30.0–36.0)
MCV: 88.9 fL (ref 78.0–100.0)
MONO ABS: 0.5 10*3/uL (ref 0.1–1.0)
Monocytes Relative: 6 % (ref 3–12)
Neutro Abs: 5.8 10*3/uL (ref 1.7–7.7)
Neutrophils Relative %: 62 % (ref 43–77)
PLATELETS: 360 10*3/uL (ref 150–400)
RBC: 4.59 MIL/uL (ref 3.87–5.11)
RDW: 13.3 % (ref 11.5–15.5)
WBC: 9.3 10*3/uL (ref 4.0–10.5)

## 2015-01-02 LAB — I-STAT TROPONIN, ED: TROPONIN I, POC: 0.01 ng/mL (ref 0.00–0.08)

## 2015-01-02 MED ORDER — POTASSIUM CHLORIDE 10 MEQ/100ML IV SOLN
10.0000 meq | Freq: Once | INTRAVENOUS | Status: AC
  Administered 2015-01-02: 10 meq via INTRAVENOUS
  Filled 2015-01-02 (×2): qty 100

## 2015-01-02 MED ORDER — NITROGLYCERIN 0.4 MG SL SUBL
0.4000 mg | SUBLINGUAL_TABLET | SUBLINGUAL | Status: DC | PRN
  Administered 2015-01-02: 0.4 mg via SUBLINGUAL
  Filled 2015-01-02: qty 1

## 2015-01-02 MED ORDER — POTASSIUM CHLORIDE 10 MEQ/100ML IV SOLN
10.0000 meq | Freq: Once | INTRAVENOUS | Status: AC
  Administered 2015-01-02: 10 meq via INTRAVENOUS

## 2015-01-02 MED ORDER — ASPIRIN 81 MG PO CHEW
162.0000 mg | CHEWABLE_TABLET | Freq: Once | ORAL | Status: DC
  Filled 2015-01-02: qty 2

## 2015-01-02 MED ORDER — POTASSIUM CHLORIDE CRYS ER 20 MEQ PO TBCR
40.0000 meq | EXTENDED_RELEASE_TABLET | Freq: Once | ORAL | Status: AC
  Administered 2015-01-02: 40 meq via ORAL
  Filled 2015-01-02: qty 2
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
## 2015-01-02 NOTE — ED Notes (Signed)
Pt c/o left sided CP intermittent x several weeks worse today

## 2015-01-03 ENCOUNTER — Encounter: Payer: Self-pay | Admitting: Vascular Surgery

## 2015-01-03 ENCOUNTER — Encounter (HOSPITAL_COMMUNITY): Payer: Self-pay | Admitting: *Deleted

## 2015-01-03 DIAGNOSIS — E785 Hyperlipidemia, unspecified: Secondary | ICD-10-CM

## 2015-01-03 DIAGNOSIS — I10 Essential (primary) hypertension: Secondary | ICD-10-CM

## 2015-01-03 DIAGNOSIS — I200 Unstable angina: Secondary | ICD-10-CM

## 2015-01-03 DIAGNOSIS — I6521 Occlusion and stenosis of right carotid artery: Secondary | ICD-10-CM | POA: Diagnosis not present

## 2015-01-03 DIAGNOSIS — R079 Chest pain, unspecified: Secondary | ICD-10-CM | POA: Diagnosis not present

## 2015-01-03 LAB — COMPREHENSIVE METABOLIC PANEL
ALK PHOS: 69 U/L (ref 38–126)
ALT: 14 U/L (ref 14–54)
AST: 18 U/L (ref 15–41)
Albumin: 3.3 g/dL — ABNORMAL LOW (ref 3.5–5.0)
Anion gap: 11 (ref 5–15)
BUN: 6 mg/dL (ref 6–20)
CALCIUM: 8.5 mg/dL — AB (ref 8.9–10.3)
CO2: 28 mmol/L (ref 22–32)
Chloride: 101 mmol/L (ref 101–111)
Creatinine, Ser: 0.65 mg/dL (ref 0.44–1.00)
GFR calc Af Amer: >60 mL/min (ref >60)
GFR calc non Af Amer: >60 mL/min (ref >60)
Glucose, Bld: 91 mg/dL (ref 70–99)
Potassium: 2.8 mmol/L — ABNORMAL LOW (ref 3.5–5.1)
Sodium: 140 mmol/L (ref 135–145)
TOTAL PROTEIN: 6.8 g/dL (ref 6.5–8.1)
Total Bilirubin: 0.6 mg/dL (ref 0.3–1.2)

## 2015-01-03 LAB — TROPONIN I
Troponin I: <0.03 ng/mL (ref <0.031)
Troponin I: <0.03 ng/mL (ref <0.031)

## 2015-01-03 LAB — TSH: TSH: 4.855 u[IU]/mL — AB (ref 0.350–4.500)

## 2015-01-03 LAB — CBC
HCT: 37.7 % (ref 36.0–46.0)
Hemoglobin: 12.4 g/dL (ref 12.0–15.0)
MCH: 29.1 pg (ref 26.0–34.0)
MCHC: 32.9 g/dL (ref 30.0–36.0)
MCV: 88.5 fL (ref 78.0–100.0)
PLATELETS: 322 10*3/uL (ref 150–400)
RBC: 4.26 MIL/uL (ref 3.87–5.11)
RDW: 13.4 % (ref 11.5–15.5)
WBC: 7.4 10*3/uL (ref 4.0–10.5)

## 2015-01-03 LAB — BASIC METABOLIC PANEL
ANION GAP: 11 (ref 5–15)
BUN: 8 mg/dL (ref 6–20)
CALCIUM: 8.7 mg/dL — AB (ref 8.9–10.3)
CO2: 28 mmol/L (ref 22–32)
CREATININE: 0.64 mg/dL (ref 0.44–1.00)
Chloride: 101 mmol/L (ref 101–111)
GFR calc Af Amer: >60 mL/min (ref >60)
Glucose, Bld: 94 mg/dL (ref 70–99)
Potassium: 2.9 mmol/L — ABNORMAL LOW (ref 3.5–5.1)
Sodium: 140 mmol/L (ref 135–145)

## 2015-01-03 LAB — MRSA PCR SCREENING: MRSA BY PCR: NEGATIVE

## 2015-01-03 LAB — MAGNESIUM: Magnesium: 1.8 mg/dL (ref 1.7–2.4)

## 2015-01-03 MED ORDER — ASPIRIN 81 MG PO CHEW
81.0000 mg | CHEWABLE_TABLET | ORAL | Status: AC
  Administered 2015-01-04: 81 mg via ORAL
  Filled 2015-01-03: qty 1

## 2015-01-03 MED ORDER — PANTOPRAZOLE SODIUM 40 MG PO TBEC
40.0000 mg | DELAYED_RELEASE_TABLET | Freq: Every day | ORAL | Status: DC
  Administered 2015-01-03 – 2015-01-04 (×2): 40 mg via ORAL
  Filled 2015-01-03 (×2): qty 1

## 2015-01-03 MED ORDER — METOPROLOL TARTRATE 12.5 MG HALF TABLET
12.5000 mg | ORAL_TABLET | Freq: Every day | ORAL | Status: DC
  Filled 2015-01-03: qty 1

## 2015-01-03 MED ORDER — ASPIRIN EC 81 MG PO TBEC
81.0000 mg | DELAYED_RELEASE_TABLET | Freq: Every day | ORAL | Status: DC
  Filled 2015-01-03: qty 1

## 2015-01-03 MED ORDER — HEPARIN SODIUM (PORCINE) 5000 UNIT/ML IJ SOLN
5000.0000 [IU] | Freq: Three times a day (TID) | INTRAMUSCULAR | Status: DC
  Administered 2015-01-03 – 2015-01-04 (×4): 5000 [IU] via SUBCUTANEOUS
  Filled 2015-01-03 (×7): qty 1

## 2015-01-03 MED ORDER — SODIUM CHLORIDE 0.9 % IV SOLN: 250.0000 mL | INTRAVENOUS | Status: DC | PRN

## 2015-01-03 MED ORDER — ATORVASTATIN CALCIUM 40 MG PO TABS
40.0000 mg | ORAL_TABLET | Freq: Every day | ORAL | Status: DC
  Administered 2015-01-03 – 2015-01-04 (×2): 40 mg via ORAL
  Filled 2015-01-03 (×2): qty 1

## 2015-01-03 MED ORDER — LEVOTHYROXINE SODIUM 50 MCG PO TABS
50.0000 ug | ORAL_TABLET | Freq: Every day | ORAL | Status: DC
  Administered 2015-01-03 – 2015-01-04 (×2): 50 ug via ORAL
  Filled 2015-01-03 (×3): qty 1

## 2015-01-03 MED ORDER — POTASSIUM CHLORIDE CRYS ER 20 MEQ PO TBCR
40.0000 meq | EXTENDED_RELEASE_TABLET | Freq: Two times a day (BID) | ORAL | Status: AC
  Administered 2015-01-03 (×2): 40 meq via ORAL
  Filled 2015-01-03 (×2): qty 2

## 2015-01-03 MED ORDER — SODIUM CHLORIDE 0.9 % IJ SOLN
3.0000 mL | Freq: Two times a day (BID) | INTRAMUSCULAR | Status: DC
  Administered 2015-01-03: 3 mL via INTRAVENOUS

## 2015-01-03 MED ORDER — TRIAMTERENE-HCTZ 37.5-25 MG PO TABS: 1.0000 | ORAL_TABLET | Freq: Every day | ORAL | Status: DC

## 2015-01-03 MED ORDER — SUMATRIPTAN SUCCINATE 100 MG PO TABS
100.0000 mg | ORAL_TABLET | Freq: Once | ORAL | Status: AC
  Administered 2015-01-03: 100 mg via ORAL
  Filled 2015-01-03: qty 1

## 2015-01-03 MED ORDER — ONDANSETRON HCL 4 MG/2ML IJ SOLN: 4.0000 mg | Freq: Four times a day (QID) | INTRAMUSCULAR | Status: DC | PRN

## 2015-01-03 MED ORDER — SODIUM CHLORIDE 0.9 % IJ SOLN: 3.0000 mL | INTRAMUSCULAR | Status: DC | PRN

## 2015-01-03 MED ORDER — SODIUM CHLORIDE 0.9 % IV SOLN
INTRAVENOUS | Status: DC
  Administered 2015-01-03: 23:00:00 via INTRAVENOUS

## 2015-01-03 MED ORDER — SODIUM CHLORIDE 0.9 % IJ SOLN: 3.0000 mL | Freq: Two times a day (BID) | INTRAMUSCULAR | Status: DC

## 2015-01-03 MED ORDER — SODIUM CHLORIDE 0.9 % IJ SOLN
3.0000 mL | Freq: Two times a day (BID) | INTRAMUSCULAR | Status: DC
  Administered 2015-01-03 – 2015-01-04 (×2): 3 mL via INTRAVENOUS

## 2015-01-03 MED ORDER — METOPROLOL SUCCINATE 12.5 MG HALF TABLET
12.5000 mg | ORAL_TABLET | Freq: Every day | ORAL | Status: DC
  Administered 2015-01-03 – 2015-01-04 (×2): 12.5 mg via ORAL
  Filled 2015-01-03 (×2): qty 1

## 2015-01-03 MED ORDER — POTASSIUM CHLORIDE 10 MEQ/100ML IV SOLN
10.0000 meq | INTRAVENOUS | Status: DC
Start: 2015-01-03 — End: 2015-01-03
  Administered 2015-01-03 (×2): 10 meq via INTRAVENOUS
  Filled 2015-01-03 (×6): qty 100

## 2015-01-03 NOTE — Progress Notes (Signed)
FPTS Interim Progress Note  S: Went to visit patient. She is doing well. Currently not complaining of any chest pain. Says CP is intermittent.   O: BP 131/55 mmHg  Pulse 81  Temp(Src) 97.7 F (36.5 C) (Oral)  Resp 20  Ht 5' (1.524 m)  Wt 158 lb 15.2 oz (72.1 kg)  BMI 31.04 kg/m2  SpO2 98%   General: alert, well-developed, NAD, cooperative HEENT: NCAT, EOMI. MMM.  Neck: Mild carotid bruit on R appreciated. Lungs: CTAB, normal respiratory effort, no crackles, and no wheezes.  Heart: RRR, no M/R/G.  Abdomen: Bowel sounds normal; abdomen soft and nontender.  Pulses: DP/PT diminished.  Extremities: Edema appreciated bilaterally with tenderness to palpation Neurologic: No focal deficits Skin: Intact without suspicious lesions or rashes. Warm and dry.  A/P: Unstable Angina - Cardiology was consulted this morning. They wanted to do heart cath today but patient's low potassium puts her at risk for arrhythmia. Repeat BMP tmrw. Mg and Phos wnl. Will be NPO tonight for possible cath tomorrow. Started patient on low dose BB in setting of likely heart disease. Continue Lipitor and ASA.  Concern for PVD - In setting of claudication, diminshed distal pulses, and known carotid stenosis order placed for ABIs.   Hypokalemia - Persistent despite repletion. Patient not tolerating IV runs so switched to PO potassium. Labs in am.    Pincus Large, DO 01/03/2015, 3:49 PM PGY-1, [facility] Family Medicine Service pager [phone]
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
## 2015-01-03 NOTE — Progress Notes (Signed)
OT Cancellation Note  Patient Details Name: Theresa Brennan MRN: [id number] DOB: 09-May-1946   Cancelled Treatment:    Reason Eval/Treat Not Completed: Other (comment). Pt's potassium at 2.7 and currently receiving IV potassium and reports it burning and did not want to get up with OT. Will plan to see tomorrow.  Earlie Raveling OTR/L [phone] 01/03/2015, 2:33 PM
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
## 2015-01-03 NOTE — Significant Event (Signed)
Patient continues to c/o burning at PIV site from potassium infusions despite measurements taken to alleviate side effects. Is in tears from the burning. Has received 2 runs of KCL so far (needs total of 6 runs). Notified MD for alternative route as heart cath. will not happen will today.

## 2015-01-03 NOTE — Progress Notes (Signed)
PATIENT ARRIVED TO TELE UNIT FROM E.D. VIA STRETCHER. STANDBY ASSISTED TO BATHROOM AND BED. WEIGHT AND VITALS OBTAINED. TELE APPLIED.  ASSESSMENT PERFORMED.  PATIENT ORIENTED TO EQUIPMENT AND INSTRUCTED TO CALL FOR ASSISTANCE WHEN NEEDED. BED LOCKED AND IN LOWEST POSITION.

## 2015-01-03 NOTE — Progress Notes (Signed)
PT Cancellation Note  Patient Details Name: Theresa Brennan MRN: [id number] DOB: 1946/05/09   Cancelled Treatment:    Reason Eval/Treat Not Completed: Patient not medically ready. Pt has order for bedrest with bathroom privileges. She is NPO awaiting heart cath this PM.   Ilda Foil 01/03/2015, 11:17 AM

## 2015-01-03 NOTE — Progress Notes (Signed)
Repeat labs show persistent hypokalemia despite repletion. Added magnesium level, may need to replete as well. Hypokalemia is a significant risk factor for arrhythmia during cath. Would recommend postponing case today and consider cath tomorrow. Please keep NPO p MN.  Chrystie Nose, MD, [facility] Attending Cardiologist [facility]

## 2015-01-04 ENCOUNTER — Encounter (HOSPITAL_COMMUNITY): Payer: Medicare Other

## 2015-01-04 ENCOUNTER — Encounter (HOSPITAL_COMMUNITY)
Admission: EM | Disposition: A | Discharge status: Home / Self Care | Payer: Medicare Other | Source: Home / Self Care | Attending: Emergency Medicine

## 2015-01-04 ENCOUNTER — Observation Stay (HOSPITAL_COMMUNITY): Payer: Medicare Other

## 2015-01-04 ENCOUNTER — Encounter: Payer: Medicare Other | Admitting: Vascular Surgery

## 2015-01-04 DIAGNOSIS — R079 Chest pain, unspecified: Secondary | ICD-10-CM | POA: Diagnosis not present

## 2015-01-04 DIAGNOSIS — E785 Hyperlipidemia, unspecified: Secondary | ICD-10-CM | POA: Diagnosis not present

## 2015-01-04 DIAGNOSIS — M79609 Pain in unspecified limb: Secondary | ICD-10-CM

## 2015-01-04 DIAGNOSIS — I10 Essential (primary) hypertension: Secondary | ICD-10-CM | POA: Diagnosis not present

## 2015-01-04 DIAGNOSIS — I6521 Occlusion and stenosis of right carotid artery: Secondary | ICD-10-CM | POA: Diagnosis not present

## 2015-01-04 DIAGNOSIS — E876 Hypokalemia: Secondary | ICD-10-CM | POA: Insufficient documentation

## 2015-01-04 DIAGNOSIS — I200 Unstable angina: Secondary | ICD-10-CM | POA: Diagnosis not present

## 2015-01-04 HISTORY — PX: CARDIAC CATHETERIZATION: SHX172

## 2015-01-04 LAB — CBC
HCT: 35.9 % — ABNORMAL LOW (ref 36.0–46.0)
Hemoglobin: 11.9 g/dL — ABNORMAL LOW (ref 12.0–15.0)
MCH: 30 pg (ref 26.0–34.0)
MCHC: 33.1 g/dL (ref 30.0–36.0)
MCV: 90.4 fL (ref 78.0–100.0)
PLATELETS: 259 10*3/uL (ref 150–400)
RBC: 3.97 MIL/uL (ref 3.87–5.11)
RDW: 13.8 % (ref 11.5–15.5)
WBC: 7.3 10*3/uL (ref 4.0–10.5)

## 2015-01-04 LAB — PROTIME-INR
INR: 0.96 (ref 0.00–1.49)
Prothrombin Time: 12.9 seconds (ref 11.6–15.2)

## 2015-01-04 LAB — BASIC METABOLIC PANEL
Anion gap: 8 (ref 5–15)
BUN: 7 mg/dL (ref 6–20)
CALCIUM: 8.4 mg/dL — AB (ref 8.9–10.3)
CO2: 27 mmol/L (ref 22–32)
Chloride: 107 mmol/L (ref 101–111)
Creatinine, Ser: 0.57 mg/dL (ref 0.44–1.00)
GFR calc Af Amer: >60 mL/min (ref >60)
Glucose, Bld: 95 mg/dL (ref 70–99)
Potassium: 3.7 mmol/L (ref 3.5–5.1)
Sodium: 142 mmol/L (ref 135–145)

## 2015-01-04 LAB — HEMOGLOBIN A1C
Hgb A1c MFr Bld: 5.9 % — ABNORMAL HIGH (ref 4.8–5.6)
MEAN PLASMA GLUCOSE: 123 mg/dL

## 2015-01-04 LAB — CREATININE, SERUM
CREATININE: 0.5 mg/dL (ref 0.44–1.00)
GFR calc Af Amer: >60 mL/min (ref >60)
GFR calc non Af Amer: >60 mL/min (ref >60)

## 2015-01-04 SURGERY — LEFT HEART CATH AND CORONARY ANGIOGRAPHY
Anesthesia: LOCAL

## 2015-01-04 MED ORDER — FENTANYL CITRATE (PF) 100 MCG/2ML IJ SOLN
INTRAMUSCULAR | Status: DC | PRN
  Administered 2015-01-04: 25 ug via INTRAVENOUS

## 2015-01-04 MED ORDER — HEPARIN (PORCINE) IN NACL 2-0.9 UNIT/ML-% IJ SOLN
INTRAMUSCULAR | Status: AC
  Filled 2015-01-04: qty 1000

## 2015-01-04 MED ORDER — ASPIRIN EC 81 MG PO TBEC
81.0000 mg | DELAYED_RELEASE_TABLET | Freq: Every day | ORAL | Status: DC
Start: 2015-01-05 — End: 2015-01-04

## 2015-01-04 MED ORDER — SODIUM CHLORIDE 0.9 % IJ SOLN: 3.0000 mL | Freq: Two times a day (BID) | INTRAMUSCULAR | Status: DC

## 2015-01-04 MED ORDER — HEPARIN SODIUM (PORCINE) 5000 UNIT/ML IJ SOLN
5000.0000 [IU] | Freq: Three times a day (TID) | INTRAMUSCULAR | Status: DC
Start: 2015-01-05 — End: 2015-01-04

## 2015-01-04 MED ORDER — SODIUM CHLORIDE 0.9 % WEIGHT BASED INFUSION
3.0000 mL/kg/h | INTRAVENOUS | Status: AC
  Administered 2015-01-04: 3 mL/kg/h via INTRAVENOUS

## 2015-01-04 MED ORDER — NITROGLYCERIN 1 MG/10 ML FOR IR/CATH LAB
INTRA_ARTERIAL | Status: AC
  Filled 2015-01-04: qty 10

## 2015-01-04 MED ORDER — MIDAZOLAM HCL 2 MG/2ML IJ SOLN
INTRAMUSCULAR | Status: AC
  Filled 2015-01-04: qty 2

## 2015-01-04 MED ORDER — VERAPAMIL HCL 2.5 MG/ML IV SOLN
INTRAVENOUS | Status: AC
  Filled 2015-01-04: qty 2

## 2015-01-04 MED ORDER — IOHEXOL 350 MG/ML SOLN
INTRAVENOUS | Status: DC | PRN
  Administered 2015-01-04: 100 mL via INTRAVENOUS

## 2015-01-04 MED ORDER — VERAPAMIL HCL 2.5 MG/ML IV SOLN
INTRAVENOUS | Status: DC | PRN
  Administered 2015-01-04: 16:00:00 via INTRA_ARTERIAL

## 2015-01-04 MED ORDER — FENTANYL CITRATE (PF) 100 MCG/2ML IJ SOLN
INTRAMUSCULAR | Status: AC
  Filled 2015-01-04: qty 2

## 2015-01-04 MED ORDER — LIDOCAINE HCL (PF) 1 % IJ SOLN
INTRAMUSCULAR | Status: AC
  Filled 2015-01-04: qty 30

## 2015-01-04 MED ORDER — HEPARIN SODIUM (PORCINE) 1000 UNIT/ML IJ SOLN
INTRAMUSCULAR | Status: DC | PRN
  Administered 2015-01-04: 4000 [IU] via INTRAVENOUS

## 2015-01-04 MED ORDER — HEPARIN SODIUM (PORCINE) 1000 UNIT/ML IJ SOLN
INTRAMUSCULAR | Status: AC
  Filled 2015-01-04: qty 1

## 2015-01-04 MED ORDER — SODIUM CHLORIDE 0.9 % IV SOLN: 250.0000 mL | INTRAVENOUS | Status: DC | PRN

## 2015-01-04 MED ORDER — MIDAZOLAM HCL 2 MG/2ML IJ SOLN
INTRAMUSCULAR | Status: DC | PRN
  Administered 2015-01-04: 1 mg via INTRAVENOUS

## 2015-01-04 MED ORDER — METOPROLOL SUCCINATE ER 25 MG PO TB24: 12.5000 mg | ORAL_TABLET | Freq: Every day | ORAL | Status: DC

## 2015-01-04 MED ORDER — SODIUM CHLORIDE 0.9 % IJ SOLN: 3.0000 mL | INTRAMUSCULAR | Status: DC | PRN

## 2015-01-04 SURGICAL SUPPLY — 14 items

## 2015-01-04 NOTE — Discharge Summary (Signed)
Family Medicine Teaching [facility] Discharge Summary  Patient name: Theresa Brennan Medical record number: [id number] Date of birth: 03-Feb-1946 Age: 69 y.o. Gender: female Date of Admission: 01/02/2015  Date of Discharge: 01/04/2015 Admitting Physician: Carney Living, MD  Primary Care Provider: Uvaldo Rising, MD Consultants: Cardiology  Indication for Hospitalization: Chest Pain  Discharge Diagnoses/Problem List:  Patient Active Problem List   Diagnosis Date Noted  . Pain in the chest   . Hypokalemia   . Unstable angina 01/03/2015  . Carotid artery stenosis 12/16/2014  . Hyperlipidemia 12/07/2014  . Subclinical hypothyroidism 12/07/2014  . Chest pain 12/05/2014  . Leg swelling 12/05/2014  . Restless leg syndrome 08/29/2014  . UTI (urinary tract infection) 08/29/2014  . Dysuria 11/24/2013  . Viral illness 11/23/2013  . Sore throat 10/29/2013  . Lumbar back pain 10/09/2013  . Bereavement 10/08/2013  . Influenza-like illness 08/24/2013  . VAGINITIS, ATROPHIC 03/29/2010  . MENOPAUSE-RELATED VASOMOTOR SYMPTOMS, HOT FLASHES 01/25/2010  . DEGENERATIVE JOINT DISEASE, HIPS 01/25/2010  . HYPERLIPIDEMIA, MILD 06/13/2009  . COMMON MIGRAINE 12/06/2008  . ALLERGIC RHINITIS, SEASONAL 12/06/2008  . HYPERTENSION, BENIGN 12/23/2006  . Somatization disorder 10/23/2006  . SINUSITIS, CHRONIC, NOS 10/23/2006  . GASTROESOPHAGEAL REFLUX, NO ESOPHAGITIS 10/23/2006  . IRRITABLE BOWEL SYNDROME 10/23/2006  . Interstitial cystitis 10/23/2006  . DYSPAREUNIA 10/23/2006  . OSTEOARTHRITIS OF SPINE, NOS 10/23/2006  . OSTEOPENIA 10/23/2006    Disposition: Home  Discharge Condition: Improved  Discharge Exam:  Filed Vitals:   01/04/15 1802  BP: 136/66  Pulse: 79  Temp:   Resp: 18   General: alert, well-developed, NAD, cooperative HEENT: NCAT, EOMI. MMM.  Neck: Mild carotid bruit on R appreciated. Lungs: CTAB, normal respiratory effort, no crackles, and no wheezes.  Heart: RRR, no  M/R/G.  Abdomen: Bowel sounds normal; abdomen soft and nontender.  Pulses: DP/PT diminished.  Extremities: 1+ edema appreciated bilaterally with tenderness to palpation Neurologic: No focal deficits Skin: Intact without suspicious lesions or rashes.   Brief Hospital Course:  Theresa Brennan is a 69 y.o. female who presented with unstable angina. PMH is significant for Angina, HLD, GERD, Hypothyroidism, Carotid Artery Stenosis, DJD.   Unstable Angina. Presented for chest pain rule out ACS. She presented with left sided chest pain radiating to her left arm. She has history of angina that has been stable over the past 5 months. Also recently diagnosed with carotid stenosis. HEART Score - 7-8, high risk with carotid disease, and ASCVD risk 14.7%. Troponins were trended and negative. CXR unremarkable. EKG with diffuse ST/T wave changes compared with prior EKG. Cardiology was consulted for chest pain concerning for ACS. Her chest pain was relieved by nitroglycerin in the ED. Risk factors include carotid artery disease and dyslipidemia. Patient was continued on her Lipitor and aspirin. Beta blocker was started in setting of unstable angina and carotid disease. Cardiology performed cardiac cath which showed normal coronary vessels and normal left ventricle function. At discharge patient with resolution of chest pain. Stable.   Of note, patient complained of lower leg pain with palpation and walking. This is in the setting of diminished pulses, known vascular disease, and hair loss on legs. ABIs were obtained that were wnl. She does have history of restless leg syndrome and somatization disorder that could be also contributing to leg pain. Would recommend outpatient dopplers of lower extremity pulses.   The patient's other chronic conditions were stable during this hospitalization and the patient was continued on home medications.   Issues for  Follow Up:   Estradiol was discontinued.  Confirm that she has  stopped this.  Dyazide was discontinued and metoprolol started due to angina. However, cath did not demonstrate heart failure  Recheck TSH and adjust medication in 3 weeks  Make sure she follows-up with vascular surgery for carotid stenosis  Obtain lower extremity dopplers  Significant Procedures: Cardiac Cath  Significant Labs and Imaging:   Recent Labs Lab 01/02/15 1842 01/03/15 0725 01/04/15 1705  WBC 9.3 7.4 7.3  HGB 13.6 12.4 11.9*  HCT 40.8 37.7 35.9*  PLT 360 322 259    Recent Labs Lab 01/02/15 1842 01/03/15 0725 01/03/15 1129 01/03/15 1334 01/04/15 0424 01/04/15 1705  NA 138 140 140  --  142  --   K 2.8* 2.8* 2.9*  --  3.7  --   CL 97* 101 101  --  107  --   CO2 30 28 28   --  27  --   GLUCOSE 118* 91 94  --  95  --   BUN 8 6 8   --  7  --   CREATININE 0.67 0.65 0.64  --  0.57 0.50  CALCIUM 9.3 8.5* 8.7*  --  8.4*  --   MG  --   --   --  1.8  --   --   ALKPHOS  --  69  --   --   --   --   AST  --  18  --   --   --   --   ALT  --  14  --   --   --   --   ALBUMIN  --  3.3*  --   --   --   --    Dg Chest 2 View  01/02/2015 CLINICAL DATA: Left-sided chest pain and dyspnea for 2 hr. EXAM: CHEST 2 VIEW COMPARISON: 03/09/2013 FINDINGS: The heart size and mediastinal contours are within normal limits. Both lungs are clear except for minor calcified granulomatous changes. The visualized skeletal structures are unremarkable. IMPRESSION: No active cardiopulmonary disease. Electronically Signed By: 03/04/2015 M.D. On: 01/02/2015 19:55    Results/Tests Pending at Time of Discharge: None  Discharge Medications:    Medication List    STOP taking these medications        estradiol 0.5 MG tablet  Commonly known as:  ESTRACE     fluticasone 50 MCG/ACT nasal spray  Commonly known as:  FLONASE     phenazopyridine 200 MG tablet  Commonly known as:  PYRIDIUM     triamterene-hydrochlorothiazide 37.5-25 MG per capsule  Commonly known as:   DYAZIDE      TAKE these medications        acetaminophen-codeine 300-30 MG per tablet  Commonly known as:  TYLENOL #3  Take 1 tablet by mouth every 6 (six) hours as needed.     aspirin 81 MG tablet  Take 81 mg by mouth daily.     atorvastatin 40 MG tablet  Commonly known as:  LIPITOR  Take 1 tablet (40 mg total) by mouth daily.     cyclobenzaprine 5 MG tablet  Commonly known as:  FLEXERIL  TAKE ONE TABLET BY MOUTH ONCE DAILY AT BEDTIME     Diclofenac Potassium 50 MG Pack  Take 50 mg by mouth once as needed.     ferrous sulfate 325 (65 FE) MG tablet  Take 1 tablet (325 mg total) by mouth daily with breakfast.  levothyroxine 50 MCG tablet  Commonly known as:  SYNTHROID, LEVOTHROID  Take 1 tablet (50 mcg total) by mouth daily.     metoprolol succinate 25 MG 24 hr tablet  Commonly known as:  TOPROL-XL  Take 0.5 tablets (12.5 mg total) by mouth daily.     omeprazole 40 MG capsule  Commonly known as:  PRILOSEC  Take 1 capsule (40 mg total) by mouth daily.     SUMAtriptan 100 MG tablet  Commonly known as:  IMITREX  TAKE ONE TABLET BY MOUTH AS NEEDED FOR  MIGRAINE     traMADol 50 MG tablet  Commonly known as:  ULTRAM  TAKE ONE TABLET BY MOUTH EVERY 6 HOURS AS NEEDED        Discharge Instructions: Please refer to Patient Instructions section of EMR for full details.  Patient was counseled important signs and symptoms that should prompt return to medical care, changes in medications, dietary instructions, activity restrictions, and follow up appointments.   Follow-Up Appointments: Follow-up Information    Follow up with Uvaldo Rising, MD. Go on 01/10/2015.   Specialty:  Family Medicine   Why:  @11am  for hospital follow-up   Contact information:   [address] [place] [address] [phone]       Pincus Large, DO 01/05/2015, 9:27 AM PGY-1, [facility]

## 2015-01-04 NOTE — Discharge Instructions (Signed)
Ms. Vetere you are hospitalized for chest pain.  Cardiology evaluated your heart and did not find any indication of heart attack or other coronary artery disease.  Your potassium was low on admission , which was replaced.  Your blood pressure medication Dyazide was stopped; you should not take this unless advised by your primary care doctor, Dr. Randolm Idol to restart.  He was started on a another blood pressure medication called metoprolol, which she should continue to take until told otherwise by your primary care physician.  Your estradiol was also discontinued; you should not take this any longer.

## 2015-01-04 NOTE — Interval H&P Note (Signed)
History and Physical Interval Note:  01/04/2015 3:25 PM  Theresa Brennan  has presented today for surgery, with the diagnosis of * No pre-op diagnosis entered *  The various methods of treatment have been discussed with the patient and family. After consideration of risks, benefits and other options for treatment, the patient has consented to  Procedure(s): Left Heart Cath and Coronary Angiography (N/A) as a surgical intervention .  The patient's history has been reviewed, patient examined, no change in status, stable for surgery.  I have reviewed the patient's chart and labs.  Questions were answered to the patient's satisfaction.   Cath Lab Visit (complete for each Cath Lab visit)  Clinical Evaluation Leading to the Procedure:   ACS: Yes.    Non-ACS:    Anginal Classification: CCS III  Anti-ischemic medical therapy: Minimal Therapy (1 class of medications)  Non-Invasive Test Results: No non-invasive testing performed  Prior CABG: No previous CABG        Theron Arista [facility] 01/04/2015 3:25 PM

## 2015-01-04 NOTE — H&P (View-Only) (Signed)
    Subjective:  Feels better post KCL replete. No CP, no SOB.   Objective:  Vital Signs in the last 24 hours: Temp:  [97.4 F (36.3 C)-98.1 F (36.7 C)] 97.4 F (36.3 C) (05/11 0636) Pulse Rate:  [73-81] 76 (05/11 0636) Resp:  [18-20] 18 (05/11 0636) BP: (118-143)/(52-60) 118/52 mmHg (05/11 0636) SpO2:  [98 %-100 %] 98 % (05/11 0636) Weight:  [162 lb 11.2 oz (73.8 kg)] 162 lb 11.2 oz (73.8 kg) (05/11 0636)  Intake/Output from previous day: 05/10 0701 - 05/11 0700 In: 1441.3 [P.O.:840; I.V.:601.3] Out: -    Physical Exam: General: Well developed, well nourished, in no acute distress. Head:  Normocephalic and atraumatic. Lungs: Clear to auscultation and percussion. Heart: Normal S1 and S2.  No murmur, rubs or gallops.  Abdomen: soft, non-tender, positive bowel sounds.  Extremities: No clubbing or cyanosis. No edema. Neurologic: Alert and oriented x 3.    Lab Results:  Recent Labs  01/02/15 1842 01/03/15 0725  WBC 9.3 7.4  HGB 13.6 12.4  PLT 360 322    Recent Labs  01/03/15 1129 01/04/15 0424  NA 140 142  K 2.9* 3.7  CL 101 107  CO2 28 27  GLUCOSE 94 95  BUN 8 7  CREATININE 0.64 0.57    Recent Labs  01/03/15 0725 01/03/15 1334  TROPONINI <0.03 <0.03   Hepatic Function Panel  Recent Labs  01/03/15 0725  PROT 6.8  ALBUMIN 3.3*  AST 18  ALT 14  ALKPHOS 69  BILITOT 0.6   No results for input(s): CHOL in the last 72 hours. No results for input(s): PROTIME in the last 72 hours.  Imaging: Dg Chest 2 View  01/02/2015   CLINICAL DATA:  Left-sided chest pain and dyspnea for 2 hr.  EXAM: CHEST  2 VIEW  COMPARISON:  03/09/2013  FINDINGS: The heart size and mediastinal contours are within normal limits. Both lungs are clear except for minor calcified granulomatous changes. The visualized skeletal structures are unremarkable.  IMPRESSION: No active cardiopulmonary disease.   Electronically Signed   By: Daniel R Mitchell M.D.   On: 01/02/2015 19:55    Personally viewed.   Telemetry: No adverse rhythms Personally viewed.   EKG:  TWI inflat  Cardiac Studies:  ECHO p Scheduled Meds: . atorvastatin  40 mg Oral Daily  . heparin  5,000 Units Subcutaneous 3 times per day  . levothyroxine  50 mcg Oral QAC breakfast  . metoprolol succinate  12.5 mg Oral Daily  . pantoprazole  40 mg Oral Daily  . sodium chloride  3 mL Intravenous Q12H  . sodium chloride  3 mL Intravenous Q12H  . sodium chloride  3 mL Intravenous Q12H   Continuous Infusions: . sodium chloride 75 mL/hr at 01/03/15 2259   PRN Meds:.sodium chloride, sodium chloride, nitroGLYCERIN, ondansetron (ZOFRAN) IV, sodium chloride, sodium chloride  Assessment/Plan:  Principal Problem:   Unstable angina Active Problems:   HYPERTENSION, BENIGN   Hyperlipidemia   Carotid artery stenosis  -Cath today, she is NPO -KCL replete (cath postponed yesterday because of this) -Statin, bb. Will give ASA.  Await results of cath   SKAINS, MARK 01/04/2015, 9:43 AM     

## 2015-01-04 NOTE — Progress Notes (Signed)
Patient was educated on D/C and given handouts. Discussed new medication with patient and how to increase her activity since the heart catherization today. IV removed. Tele box removed. Patient's belongings given to husband. Patient wheeled out to the front by staff for D/C.   Valinda Hoar RN

## 2015-01-04 NOTE — Progress Notes (Signed)
VASCULAR LAB PRELIMINARY  ARTERIAL  ABI completed: WITHIN NORMAL LIMITS     RIGHT    LEFT    PRESSURE WAVEFORM  PRESSURE WAVEFORM  BRACHIAL 146 Tri BRACHIAL 147 Tri  DP   DP    AT 152 Tri AT 146 Tri  PT 163 Tri PT 165 Tri  PER   PER    GREAT TOE  NA GREAT TOE  NA    RIGHT LEFT  ABI 1.11 1.12     Farrel Demark, RDMS, RVT  01/04/2015, 10:22 AM

## 2015-01-04 NOTE — Progress Notes (Signed)
Family Medicine Teaching Service Daily Progress Note Intern Pager: [phone]  Patient name: Theresa Brennan Medical record number: [id number] Date of birth: [birth year] Age: 69 y.o. Gender: female  Primary Care Provider: Uvaldo Rising, MD Consultants: Cardiology  Code Status: Full  Pt Overview and Major Events to Date:  5/10: Admitted for chest pain 5/11: Cardiac cath today  Assessment and Plan: Theresa Brennan is a 69 y.o. female presenting with Unstable Angina . PMH is significant for Angina, HLD, GERD, Hypothyroidism, Carotid Artery Stenosis, DJD.   # Unstable Angina: Presented for chest pain rule out ACS. Left sided chest pain radiating to her left arm. Angina that has developed and been stable over the past 5 months. Known carotid stenosis. Cardiac cath back in the 70's, but no recent cardiac workup. Was scheduled to see cardiology soon. HEART Score - 7-8, High risk with carotid disease, but ASCVD risk 14.7%. Initial troponin negative. EKG with new ST wave depression and t wave inversion in the inferior leads compared with prior EKG. Additionally has hypokalemia. Her chest pain was relieved by nitroglycerin in the ED. No CP this morning.  - continue to monitor on telemtry - Troponins were negative x3. - Nitroglycerin prn.  - Holding morphine for chest pain due to allergy. Plan to use fentanyl if needed - Goal LDL <70, pt on Lipitor and will continue. - A1c 5.9 - Consulted cards; appreciate recs - pt to go for cardiac cath today -patient retaining fluid wih 4 lb wt gain; diuretic was held on admission due to hypokalemia. Would benefit from echo. - ASA -BB started yesterday in setting of unstable angina and known carotid disease  #Leg pain: Concern for PVD in setting of diminished pulses, claudication, hair loss on legs, and carotid stenosis. Patient does have h/o restless leg syndrome and somatization disorder which could be contributing to leg pain.  -getting ABIs  # Hypokalemia - Pt  on thiazide diuretic Dyazide. K on admission was 2.8. Had persistent hypokalemia yesterday despite repletion. Hypokalemia may have contributed to EKG changes noted above. Resolved. - patient now s/p 160 mEq of K-dur - K today 3.7 - trend BMET  - Holding Dyazide for now.   # HLD - ASCVD risk as above. On lipitor daily. Cholesterol 225, with HDL 47 and LDL 138 - Continue lipitor.   # Carotid Stenosis - Right ICA with 80 - 99% stenosis. Pt. Scheduled for appointment with vascular surgery. Known disease. Stable at this time.  - May need to reschedule appointment, but otherwise continue plan to follow up with vascular.   # Hypothyroidism - subclinical hypothyroidism. Just started on synthroid last month.  - TSH 4.8  - On 50 mcg of synthroid daily. Continue here. - will not adjust at this time as she was only started on this dose ~4 wks ago and indication to adjust dose is between 6-8wks out.   # HTN - Normotensive.  - Holding Dyazide at this time 2/2 hypokalemia.  - Started BB yesterday for unstable angina  # GERD - Protonix here.   # DJD - on tramadol at home for joint pain.  - Consider restarting here if needed.  - Holding for now.   FEN/GI: NPO pending cath / NS @75mL /hr Prophylaxis: Sub Q heparin  Disposition: Continue current management as above; pending further cardiac work-up.   Subjective:  Doing well this morning. Denies any CP or SOB. No overnight events. States all her questions were answered by cardiology yesterday in regards to  procedure today.   Of note, says she had not been taking potassium with her diuretics for months. Feels like she needs her diuretic back due to feeling swollen.   Objective: Temp:  [97.4 F (36.3 C)-98.1 F (36.7 C)] 97.4 F (36.3 C) (05/11 0636) Pulse Rate:  [73-81] 76 (05/11 0636) Resp:  [18-20] 18 (05/11 0636) BP: (118-143)/(52-60) 118/52 mmHg (05/11 0636) SpO2:  [98 %-100 %] 98 % (05/11 0636) Weight:  [162 lb 11.2 oz (73.8 kg)]  162 lb 11.2 oz (73.8 kg) (05/11 0636) Physical Exam: General: alert, well-developed, NAD, cooperative HEENT: NCAT, EOMI. MMM.  Neck: Mild carotid bruit on R appreciated. Lungs: CTAB, normal respiratory effort, no crackles, and no wheezes.  Heart: RRR, no M/R/G.  Abdomen: Bowel sounds normal; abdomen soft and nontender.  Pulses: DP/PT diminished.  Extremities: 1+ edema appreciated bilaterally with tenderness to palpation Neurologic: No focal deficits Skin: Intact without suspicious lesions or rashes.   Laboratory:  Recent Labs Lab 01/02/15 1842 01/03/15 0725  WBC 9.3 7.4  HGB 13.6 12.4  HCT 40.8 37.7  PLT 360 322    Recent Labs Lab 01/03/15 0725 01/03/15 1129 01/04/15 0424  NA 140 140 142  K 2.8* 2.9* 3.7  CL 101 101 107  CO2 28 28 27   BUN 6 8 7   CREATININE 0.65 0.64 0.57  CALCIUM 8.5* 8.7* 8.4*  PROT 6.8  --   --   BILITOT 0.6  --   --   ALKPHOS 69  --   --   ALT 14  --   --   AST 18  --   --   GLUCOSE 91 94 95   Troponin x3 <0.03  Imaging/Diagnostic Tests: Dg Chest 2 View  01/02/2015   CLINICAL DATA:  Left-sided chest pain and dyspnea for 2 hr.  EXAM: CHEST  2 VIEW  COMPARISON:  03/09/2013  FINDINGS: The heart size and mediastinal contours are within normal limits. Both lungs are clear except for minor calcified granulomatous changes. The visualized skeletal structures are unremarkable.  IMPRESSION: No active cardiopulmonary disease.   Electronically Signed   By: 03/04/2015 M.D.   On: 01/02/2015 19:55    Ellery Plunk, DO 01/04/2015, 7:31 AM PGY-1, [facility] FPTS Intern pager: [phone], text pages welcome

## 2015-01-04 NOTE — Progress Notes (Signed)
OT Cancellation Note  Patient Details Name: Theresa Brennan MRN: [id number] DOB: [birth year]   Cancelled Treatment:    Reason Eval/Treat Not Completed: Patient at procedure or test/ unavailable  Angelene Giovanni Cecila Satcher, OTR/L [phone]  01/04/2015, 10:08 AM

## 2015-01-04 NOTE — Evaluation (Signed)
Physical Therapy Evaluation Patient Details Name: Theresa Brennan MRN: [id number] DOB: 03/09/1946 Today's Date: 01/04/2015   History of Present Illness  Pt is a 69 y.o. female presenting with Unstable Angina . PMH is significant for Angina, HLD, GERD, Hypothyroidism, Carotid Artery Stenosis, DJD.  Clinical Impression  Pt admitted with above diagnosis. Pt currently with functional limitations due to the deficits listed below (see PT Problem List). At the time of PT eval pt was able to perform transfers and ambulation with modified independence. Pt is going for a heart cath later today. Will keep on PT caseload for likely one more visit post-cath to follow-up with mobility needs. Pt will benefit from skilled PT to increase their independence and safety with mobility to allow discharge to the venue listed below.      Follow Up Recommendations No PT follow up    Equipment Recommendations  None recommended by PT    Recommendations for Other Services       Precautions / Restrictions Precautions Precautions: Fall Restrictions Weight Bearing Restrictions: No      Mobility  Bed Mobility Overal bed mobility: Modified Independent             General bed mobility comments: Increased time and use of bed rails`  Transfers Overall transfer level: Modified independent Equipment used: None             General transfer comment: No physical assist required  Ambulation/Gait Ambulation/Gait assistance: Modified independent (Device/Increase time) Ambulation Distance (Feet): 500 Feet Assistive device: None Gait Pattern/deviations: Step-through pattern;Decreased stride length;Trunk flexed Gait velocity: Decreased` Gait velocity interpretation: Below normal speed for age/gender General Gait Details: No physical assist required. Did not persue stair training as pt having heart cath later today. Somewhat slowed cadence for age.  Stairs            Wheelchair Mobility    Modified  Rankin (Stroke Patients Only)       Balance Overall balance assessment: No apparent balance deficits (not formally assessed)                                           Pertinent Vitals/Pain Pain Assessment: No/denies pain    Home Living Family/patient expects to be discharged to:: Private residence Living Arrangements: Spouse/significant other Available Help at Discharge: Family;Available 24 hours/day Type of Home: House Home Access: Stairs to enter Entrance Stairs-Rails: None Entrance Stairs-Number of Steps: 5 Home Layout: One level Home Equipment: Sieler - 2 wheels      Prior Function Level of Independence: Independent         Comments: Still driving, does not work     Higher education careers adviser Dominance   Dominant Hand: Right    Extremity/Trunk Assessment   Upper Extremity Assessment: Overall WFL for tasks assessed;Defer to OT evaluation           Lower Extremity Assessment: Overall WFL for tasks assessed      Cervical / Trunk Assessment: Normal  Communication   Communication: No difficulties  Cognition Arousal/Alertness: Awake/alert Behavior During Therapy: WFL for tasks assessed/performed Overall Cognitive Status: Within Functional Limits for tasks assessed                      General Comments      Exercises        Assessment/Plan    PT Assessment Patient needs continued PT services  PT Diagnosis Difficulty walking   PT Problem List Decreased strength;Decreased range of motion;Decreased activity tolerance;Decreased balance;Decreased mobility;Decreased knowledge of use of DME;Decreased safety awareness;Decreased knowledge of precautions  PT Treatment Interventions DME instruction;Gait training;Stair training;Functional mobility training;Therapeutic activities;Therapeutic exercise;Neuromuscular re-education;Patient/family education   PT Goals (Current goals can be found in the Care Plan section) Acute Rehab PT Goals Patient Stated  Goal: Home  PT Goal Formulation: With patient Time For Goal Achievement: 01/11/15 Potential to Achieve Goals: Good    Frequency Min 3X/week   Barriers to discharge        Co-evaluation               End of Session Equipment Utilized During Treatment: Gait belt Activity Tolerance: Patient tolerated treatment well Patient left: with family/visitor present;Other (comment) (Standing at sink washing up with husband present) Nurse Communication: Mobility status    Functional Assessment Tool Used: Clinical judgement Functional Limitation: Mobility: Walking and moving around Mobility: Walking and Moving Around Current Status (O1771): At least 1 percent but less than 20 percent impaired, limited or restricted Mobility: Walking and Moving Around Goal Status [phone]): At least 1 percent but less than 20 percent impaired, limited or restricted    Time: [phone] PT Time Calculation (min) (ACUTE ONLY): 23 min   Charges:   PT Evaluation $Initial PT Evaluation Tier I: 1 Procedure PT Treatments $Gait Training: 8-22 mins   PT G Codes:   PT G-Codes **NOT FOR INPATIENT CLASS** Functional Assessment Tool Used: Clinical judgement Functional Limitation: Mobility: Walking and moving around Mobility: Walking and Moving Around Current Status (A9191): At least 1 percent but less than 20 percent impaired, limited or restricted Mobility: Walking and Moving Around Goal Status [phone]): At least 1 percent but less than 20 percent impaired, limited or restricted    Conni Slipper 01/04/2015, 9:09 AM   Conni Slipper, PT, DPT Acute Rehabilitation Services Pager: [phone]

## 2015-01-04 NOTE — Progress Notes (Signed)
    Subjective:  Feels better post KCL replete. No CP, no SOB.   Objective:  Vital Signs in the last 24 hours: Temp:  [97.4 F (36.3 C)-98.1 F (36.7 C)] 97.4 F (36.3 C) (05/11 0636) Pulse Rate:  [73-81] 76 (05/11 0636) Resp:  [18-20] 18 (05/11 0636) BP: (118-143)/(52-60) 118/52 mmHg (05/11 0636) SpO2:  [98 %-100 %] 98 % (05/11 0636) Weight:  [162 lb 11.2 oz (73.8 kg)] 162 lb 11.2 oz (73.8 kg) (05/11 0636)  Intake/Output from previous day: 05/10 0701 - 05/11 0700 In: 1441.3 [P.O.:840; I.V.:601.3] Out: -    Physical Exam: General: Well developed, well nourished, in no acute distress. Head:  Normocephalic and atraumatic. Lungs: Clear to auscultation and percussion. Heart: Normal S1 and S2.  No murmur, rubs or gallops.  Abdomen: soft, non-tender, positive bowel sounds.  Extremities: No clubbing or cyanosis. No edema. Neurologic: Alert and oriented x 3.    Lab Results:  Recent Labs  01/02/15 1842 01/03/15 0725  WBC 9.3 7.4  HGB 13.6 12.4  PLT 360 322    Recent Labs  01/03/15 1129 01/04/15 0424  NA 140 142  K 2.9* 3.7  CL 101 107  CO2 28 27  GLUCOSE 94 95  BUN 8 7  CREATININE 0.64 0.57    Recent Labs  01/03/15 0725 01/03/15 1334  TROPONINI <0.03 <0.03   Hepatic Function Panel  Recent Labs  01/03/15 0725  PROT 6.8  ALBUMIN 3.3*  AST 18  ALT 14  ALKPHOS 69  BILITOT 0.6   No results for input(s): CHOL in the last 72 hours. No results for input(s): PROTIME in the last 72 hours.  Imaging: Dg Chest 2 View  01/02/2015   CLINICAL DATA:  Left-sided chest pain and dyspnea for 2 hr.  EXAM: CHEST  2 VIEW  COMPARISON:  03/09/2013  FINDINGS: The heart size and mediastinal contours are within normal limits. Both lungs are clear except for minor calcified granulomatous changes. The visualized skeletal structures are unremarkable.  IMPRESSION: No active cardiopulmonary disease.   Electronically Signed   By: Ellery Plunk M.D.   On: 01/02/2015 19:55    Personally viewed.   Telemetry: No adverse rhythms Personally viewed.   EKG:  TWI inflat  Cardiac Studies:  ECHO p Scheduled Meds: . atorvastatin  40 mg Oral Daily  . heparin  5,000 Units Subcutaneous 3 times per day  . levothyroxine  50 mcg Oral QAC breakfast  . metoprolol succinate  12.5 mg Oral Daily  . pantoprazole  40 mg Oral Daily  . sodium chloride  3 mL Intravenous Q12H  . sodium chloride  3 mL Intravenous Q12H  . sodium chloride  3 mL Intravenous Q12H   Continuous Infusions: . sodium chloride 75 mL/hr at 01/03/15 2259   PRN Meds:.sodium chloride, sodium chloride, nitroGLYCERIN, ondansetron (ZOFRAN) IV, sodium chloride, sodium chloride  Assessment/Plan:  Principal Problem:   Unstable angina Active Problems:   HYPERTENSION, BENIGN   Hyperlipidemia   Carotid artery stenosis  -Cath today, she is NPO -KCL replete (cath postponed yesterday because of this) -Statin, bb. Will give ASA.  Await results of cath   Donato Schultz 01/04/2015, 9:43 AM

## 2015-01-05 ENCOUNTER — Encounter (HOSPITAL_COMMUNITY): Payer: Self-pay | Admitting: Cardiology

## 2015-01-05 MED FILL — Lidocaine HCl Local Preservative Free (PF) Inj 1%: INTRAMUSCULAR | Qty: 30 | Status: AC

## 2015-01-05 MED FILL — Heparin Sodium (Porcine) 2 Unit/ML in Sodium Chloride 0.9%: INTRAMUSCULAR | Qty: 1000 | Status: AC

## 2015-01-06 ENCOUNTER — Telehealth: Payer: Self-pay | Admitting: Cardiology

## 2015-01-06 NOTE — Telephone Encounter (Signed)
Pt called in stating that Dr. [place] did her cath yesterday ans she states that her arm and wrist has been swollen . She would like to know if this is normal . Please f/u with her   Thanks

## 2015-01-06 NOTE — Telephone Encounter (Signed)
Returned call to patient she stated she had a cath 01/04/15.Stated rt wrist cath site sore and swollen.Stated swelling better today.No redness.Cool to touch no fever.Stated she can move fingers better today. Advised she can take off bandage today.Advised to follow directions and no lifting with rt wrist for 1 week.Advised to keep previous appointment with Dr.Brackbill 01/17/15 at 2:30 pm at [facility] office.Advised to call back if needed.

## 2015-01-10 ENCOUNTER — Ambulatory Visit (INDEPENDENT_AMBULATORY_CARE_PROVIDER_SITE_OTHER): Payer: Medicare Other | Admitting: Family Medicine

## 2015-01-10 ENCOUNTER — Inpatient Hospital Stay: Payer: Medicare Other | Admitting: Family Medicine

## 2015-01-10 ENCOUNTER — Encounter: Payer: Self-pay | Admitting: Family Medicine

## 2015-01-10 VITALS — BP 112/53 | HR 70 | Temp 97.7°F | Ht 60.0 in | Wt 165.0 lb

## 2015-01-10 DIAGNOSIS — M79606 Pain in leg, unspecified: Secondary | ICD-10-CM | POA: Insufficient documentation

## 2015-01-10 DIAGNOSIS — I200 Unstable angina: Secondary | ICD-10-CM | POA: Diagnosis not present

## 2015-01-10 DIAGNOSIS — M7989 Other specified soft tissue disorders: Secondary | ICD-10-CM

## 2015-01-10 DIAGNOSIS — E8809 Other disorders of plasma-protein metabolism, not elsewhere classified: Secondary | ICD-10-CM | POA: Diagnosis not present

## 2015-01-10 DIAGNOSIS — R079 Chest pain, unspecified: Secondary | ICD-10-CM

## 2015-01-10 DIAGNOSIS — I10 Essential (primary) hypertension: Secondary | ICD-10-CM

## 2015-01-10 DIAGNOSIS — N951 Menopausal and female climacteric states: Secondary | ICD-10-CM | POA: Diagnosis not present

## 2015-01-10 MED ORDER — TRAMADOL HCL 50 MG PO TABS: 50.0000 mg | ORAL_TABLET | Freq: Four times a day (QID) | ORAL | Status: DC | PRN

## 2015-01-10 MED ORDER — TRIAMTERENE-HCTZ 37.5-25 MG PO CAPS: 1.0000 | ORAL_CAPSULE | Freq: Every day | ORAL | Status: DC

## 2015-01-10 MED ORDER — SUMATRIPTAN SUCCINATE 100 MG PO TABS
ORAL_TABLET | ORAL | Status: DC
Start: 2015-01-10 — End: 2015-05-16

## 2015-01-10 MED ORDER — CYCLOBENZAPRINE HCL 5 MG PO TABS: 5.0000 mg | ORAL_TABLET | Freq: Every day | ORAL | Status: DC

## 2015-01-10 NOTE — Assessment & Plan Note (Signed)
Estrogen stopped during hospitalization for ACS rule out. Heart cath negative however patient has bilateral carotid artery stenosis. Patient very symptomatic when off Estrogen in the past. -I counseled the patient that I would recommend against estrogen (due to potential increased cardiac and stroke risk) however she would need to discuss this in more detail with her OB/GYN

## 2015-01-10 NOTE — Assessment & Plan Note (Signed)
Blood pressure well controlled on Toprol however patient wishes to return to Dyazide. BP likely unable to tolerate both medications. -Stop Toprol as no cardiac indication (heart cath normal) -restart Dyazide to help with leg swelling -monitor blood pressure

## 2015-01-10 NOTE — Assessment & Plan Note (Signed)
Encouraged increased protein in diet 

## 2015-01-10 NOTE — Patient Instructions (Signed)
Chest Pain - I am glad to hear that it is resolved, please continue aspirin and cholesterol medications  Leg swelling - likely due to venous insufficiency however will restart Diazide, check Echocardiogram to evaluate cardiac function  Blood Pressure - stop the Metoprolol, restart Dyazide  Questions for OB-GYN -Should Ms. Fick continue her estrogen. It would be my opinion for her to stop this medication if she was able to tolerate the side effects  Questions for Vascular Surgery -What do to about the blockage in your carotid arteries. -Does Ms Dalal need any additional vascular studies of her legs, ABI wnl in hospital, questionable history of claudication/poor hair growth, diminished pulses in her legs

## 2015-01-10 NOTE — Assessment & Plan Note (Signed)
Hospital follow up for chest pain/ACS rule out. Heart cath negative. Patient currently asymptomatic -continue asa and statin -no indication for BB at cath clean

## 2015-01-10 NOTE — Assessment & Plan Note (Signed)
Likely multifactorial from venous insufficiency, low albumin, and hypothyroid. -continue treatment for hypothyroid -encouraged increased protein in diet -restart Dyazide -hold on compression stockings as concern for PAD -keep legs elevated when able to

## 2015-01-10 NOTE — Progress Notes (Signed)
   Subjective:    Patient ID: Theresa Brennan, female    DOB: 10-29-1945, 69 y.o.   MRN: [id number]  HPI 69 y/o female presents for hospital follow up. Admitted to [facility] from 01/02/15 - 01/04/15 for ACS rule out. Patient underwent heart catheterization that was negative for CAD.   Chest Pain - no further symptoms, has follow up with cardiology on 01/19/15  HTN - started on Metoprolol during hospital stay as there was a concern for ACS, Dyazide was discontinued due to well controlled BP's, patient is tolerating metoprolol well however would like to return to Dyazide as she reports increased leg swelling  Leg edema - worse since discharge as off Dyazide, patient wishes to restart Dyazide, labwork in hospital negative for liver/renal etiology. Does have known hypothyroidism. Albumin also low at 3.3. Does not wear compression stockings, symptoms improved with leg elevation, uses 2 pillows at night, no PND  Leg pain - patient had ABI while hospitalized to evaluate for PAD which was wnl. Patient reports mostly left leg pain only after long walks (greater than 1-2 miles), able to walk short distances without difficulty  Menopausal symptoms - patient reports hot flashes and vaginal dryness s/p hysterectomy in her 20's, she has been on long term Estradiol, managed by Dr. Arther Abbott, Estrogen was stopped during the hospitalization due to concern for ACS, patient wishes to restart this medication  Social - lives with husband, nonsmoker  Review of Systems  Constitutional: Negative for fever, chills and fatigue.  Respiratory: Negative for cough and shortness of breath.   Cardiovascular: Positive for leg swelling. Negative for chest pain and palpitations.  Gastrointestinal: Negative for nausea, vomiting and diarrhea.       Objective:   Physical Exam Vitals: reviewed Gen: pleasant female, NAD HEENT: normocephalic, PERRL, EOMI, no scleral icterus, MMM, uvula midline Cardiac: RRR, S1 and S2  present, no murmur, no heaves/thrills Resp: CTAB, normal effort Ext: 2+ edema to midshin, unable to palpate DP or PT pulses on left foot, 1+ DP pulse in right foot, unable to palpate DP pulse in right foot, attempted to find pulses with OB doppler however unsuccessful (no vascular doppler in office)  Reviewed lab work and imaging from hospitalization. Reviewed discharge summary.  Reviewed ABI results.     Assessment & Plan:  Please see problem specific assessment and plan.

## 2015-01-10 NOTE — Assessment & Plan Note (Signed)
Bilateral leg pain with prolonged ambulation (greater than one mile). Exam shows signs of PAD including decreased hair growth and poor peripheral pulses. ABI in hospital wnl however could potentially represent calcification of vessels. Patient has upcoming vascular surgery appointment for bilateral carotid stenosis. -I have asked the patient to discuss this issue with the vascular surgeon and to consider additional imaging (possibly arterial dopplers).

## 2015-01-17 ENCOUNTER — Encounter: Payer: Self-pay | Admitting: Physician Assistant

## 2015-01-17 ENCOUNTER — Ambulatory Visit: Payer: Medicare Other | Admitting: Cardiology

## 2015-01-17 ENCOUNTER — Ambulatory Visit (INDEPENDENT_AMBULATORY_CARE_PROVIDER_SITE_OTHER): Payer: Medicare Other | Admitting: Physician Assistant

## 2015-01-17 VITALS — BP 141/85 | HR 87 | Ht 60.0 in | Wt 164.0 lb

## 2015-01-17 DIAGNOSIS — R079 Chest pain, unspecified: Secondary | ICD-10-CM

## 2015-01-17 DIAGNOSIS — H6061 Unspecified chronic otitis externa, right ear: Secondary | ICD-10-CM | POA: Diagnosis not present

## 2015-01-17 DIAGNOSIS — J320 Chronic maxillary sinusitis: Secondary | ICD-10-CM | POA: Diagnosis not present

## 2015-01-17 DIAGNOSIS — R0602 Shortness of breath: Secondary | ICD-10-CM

## 2015-01-17 DIAGNOSIS — H7121 Cholesteatoma of mastoid, right ear: Secondary | ICD-10-CM | POA: Diagnosis not present

## 2015-01-17 DIAGNOSIS — I6523 Occlusion and stenosis of bilateral carotid arteries: Secondary | ICD-10-CM | POA: Diagnosis not present

## 2015-01-17 DIAGNOSIS — E785 Hyperlipidemia, unspecified: Secondary | ICD-10-CM | POA: Diagnosis not present

## 2015-01-17 DIAGNOSIS — I200 Unstable angina: Secondary | ICD-10-CM | POA: Diagnosis not present

## 2015-01-17 DIAGNOSIS — I10 Essential (primary) hypertension: Secondary | ICD-10-CM | POA: Diagnosis not present

## 2015-01-17 DIAGNOSIS — H6121 Impacted cerumen, right ear: Secondary | ICD-10-CM | POA: Diagnosis not present

## 2015-01-17 DIAGNOSIS — J322 Chronic ethmoidal sinusitis: Secondary | ICD-10-CM | POA: Diagnosis not present

## 2015-01-17 DIAGNOSIS — R42 Dizziness and giddiness: Secondary | ICD-10-CM

## 2015-01-17 MED ORDER — ASPIRIN EC 325 MG PO TBEC: 325.0000 mg | DELAYED_RELEASE_TABLET | Freq: Every day | ORAL | Status: DC
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
## 2015-01-17 NOTE — Patient Instructions (Signed)
Medication Instructions:  1. INCREASE ASPIRIN 325 MG DAILY  Labwork: NONE  Testing/Procedures: Your physician has requested that you have an echocardiogram. Echocardiography is a painless test that uses sound waves to create images of your heart. It provides your doctor with information about the size and shape of your heart and how well your heart's chambers and valves are working. This procedure takes approximately one hour. There are no restrictions for this procedure.  Follow-Up: FOLLOW UP WITH DR. HILTY IN 3 MONTHS  Any Other Special Instructions Will Be Listed Below (If Applicable).

## 2015-01-20 ENCOUNTER — Encounter: Payer: Self-pay | Admitting: Vascular Surgery

## 2015-01-24 ENCOUNTER — Other Ambulatory Visit: Payer: Self-pay

## 2015-01-24 ENCOUNTER — Ambulatory Visit (HOSPITAL_COMMUNITY): Payer: Medicare Other | Attending: Physician Assistant

## 2015-01-24 DIAGNOSIS — I340 Nonrheumatic mitral (valve) insufficiency: Secondary | ICD-10-CM | POA: Diagnosis not present

## 2015-01-24 DIAGNOSIS — R0602 Shortness of breath: Secondary | ICD-10-CM

## 2015-01-25 ENCOUNTER — Encounter: Payer: Medicare Other | Admitting: Vascular Surgery

## 2015-01-25 ENCOUNTER — Encounter: Payer: Self-pay | Admitting: Physician Assistant

## 2015-01-25 ENCOUNTER — Inpatient Hospital Stay (HOSPITAL_COMMUNITY): Admit: 2015-01-25 | Payer: Medicare Other

## 2015-01-25 DIAGNOSIS — H6061 Unspecified chronic otitis externa, right ear: Secondary | ICD-10-CM | POA: Diagnosis not present

## 2015-01-25 DIAGNOSIS — H6121 Impacted cerumen, right ear: Secondary | ICD-10-CM | POA: Diagnosis not present

## 2015-01-26 ENCOUNTER — Ambulatory Visit (HOSPITAL_COMMUNITY)
Admission: RE | Admit: 2015-01-26 | Discharge: 2015-01-26 | Disposition: A | Discharge status: Home / Self Care | Payer: Medicare Other | Source: Ambulatory Visit | Attending: Vascular Surgery | Admitting: Vascular Surgery

## 2015-01-26 ENCOUNTER — Encounter: Payer: Self-pay | Admitting: Vascular Surgery

## 2015-01-26 ENCOUNTER — Other Ambulatory Visit: Payer: Self-pay

## 2015-01-26 ENCOUNTER — Ambulatory Visit (INDEPENDENT_AMBULATORY_CARE_PROVIDER_SITE_OTHER): Payer: Medicare Other | Admitting: Vascular Surgery

## 2015-01-26 ENCOUNTER — Telehealth: Payer: Self-pay | Admitting: *Deleted

## 2015-01-26 VITALS — BP 121/49 | HR 74 | Ht 60.0 in | Wt 165.9 lb

## 2015-01-26 DIAGNOSIS — I200 Unstable angina: Secondary | ICD-10-CM | POA: Diagnosis not present

## 2015-01-26 DIAGNOSIS — I6523 Occlusion and stenosis of bilateral carotid arteries: Secondary | ICD-10-CM

## 2015-01-26 DIAGNOSIS — I6521 Occlusion and stenosis of right carotid artery: Secondary | ICD-10-CM

## 2015-01-26 NOTE — Telephone Encounter (Signed)
Pt notified of echo results by phone with verbal understanding to plan of care to continue the dyazide.
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
## 2015-02-01 ENCOUNTER — Encounter (HOSPITAL_COMMUNITY): Payer: Self-pay

## 2015-02-01 ENCOUNTER — Other Ambulatory Visit (HOSPITAL_COMMUNITY): Payer: Self-pay | Admitting: *Deleted

## 2015-02-01 ENCOUNTER — Encounter (HOSPITAL_COMMUNITY)
Admission: RE | Admit: 2015-02-01 | Discharge: 2015-02-01 | Disposition: A | Discharge status: Home / Self Care | Payer: Medicare Other | Source: Ambulatory Visit | Attending: Vascular Surgery | Admitting: Vascular Surgery

## 2015-02-01 DIAGNOSIS — Z880 Allergy status to penicillin: Secondary | ICD-10-CM | POA: Diagnosis not present

## 2015-02-01 DIAGNOSIS — M1990 Unspecified osteoarthritis, unspecified site: Secondary | ICD-10-CM | POA: Diagnosis not present

## 2015-02-01 DIAGNOSIS — Z8711 Personal history of peptic ulcer disease: Secondary | ICD-10-CM | POA: Diagnosis not present

## 2015-02-01 DIAGNOSIS — Z888 Allergy status to other drugs, medicaments and biological substances status: Secondary | ICD-10-CM | POA: Diagnosis not present

## 2015-02-01 DIAGNOSIS — Z7982 Long term (current) use of aspirin: Secondary | ICD-10-CM | POA: Diagnosis not present

## 2015-02-01 DIAGNOSIS — I9581 Postprocedural hypotension: Secondary | ICD-10-CM | POA: Diagnosis not present

## 2015-02-01 DIAGNOSIS — K219 Gastro-esophageal reflux disease without esophagitis: Secondary | ICD-10-CM | POA: Diagnosis present

## 2015-02-01 DIAGNOSIS — Z8249 Family history of ischemic heart disease and other diseases of the circulatory system: Secondary | ICD-10-CM | POA: Diagnosis not present

## 2015-02-01 DIAGNOSIS — D649 Anemia, unspecified: Secondary | ICD-10-CM | POA: Diagnosis present

## 2015-02-01 DIAGNOSIS — E876 Hypokalemia: Secondary | ICD-10-CM | POA: Diagnosis present

## 2015-02-01 DIAGNOSIS — E039 Hypothyroidism, unspecified: Secondary | ICD-10-CM | POA: Diagnosis present

## 2015-02-01 DIAGNOSIS — I129 Hypertensive chronic kidney disease with stage 1 through stage 4 chronic kidney disease, or unspecified chronic kidney disease: Secondary | ICD-10-CM | POA: Diagnosis not present

## 2015-02-01 DIAGNOSIS — K449 Diaphragmatic hernia without obstruction or gangrene: Secondary | ICD-10-CM | POA: Diagnosis present

## 2015-02-01 DIAGNOSIS — I6521 Occlusion and stenosis of right carotid artery: Secondary | ICD-10-CM | POA: Diagnosis not present

## 2015-02-01 DIAGNOSIS — Z823 Family history of stroke: Secondary | ICD-10-CM | POA: Diagnosis not present

## 2015-02-01 DIAGNOSIS — G43909 Migraine, unspecified, not intractable, without status migrainosus: Secondary | ICD-10-CM | POA: Diagnosis not present

## 2015-02-01 DIAGNOSIS — Z90710 Acquired absence of both cervix and uterus: Secondary | ICD-10-CM | POA: Diagnosis not present

## 2015-02-01 DIAGNOSIS — Z886 Allergy status to analgesic agent status: Secondary | ICD-10-CM | POA: Diagnosis not present

## 2015-02-01 DIAGNOSIS — Z885 Allergy status to narcotic agent status: Secondary | ICD-10-CM | POA: Diagnosis not present

## 2015-02-01 DIAGNOSIS — Z87891 Personal history of nicotine dependence: Secondary | ICD-10-CM | POA: Diagnosis not present

## 2015-02-01 DIAGNOSIS — N189 Chronic kidney disease, unspecified: Secondary | ICD-10-CM | POA: Diagnosis not present

## 2015-02-01 DIAGNOSIS — E780 Pure hypercholesterolemia: Secondary | ICD-10-CM | POA: Diagnosis not present

## 2015-02-01 DIAGNOSIS — I6523 Occlusion and stenosis of bilateral carotid arteries: Secondary | ICD-10-CM | POA: Diagnosis not present

## 2015-02-01 HISTORY — DX: Effusion, unspecified knee: M25.469

## 2015-02-01 HISTORY — DX: Hypothyroidism, unspecified: E03.9

## 2015-02-01 HISTORY — DX: Influenza due to unidentified influenza virus with other respiratory manifestations: J11.1

## 2015-02-01 HISTORY — DX: Frequency of micturition: R35.0

## 2015-02-01 HISTORY — DX: Urgency of urination: R39.15

## 2015-02-01 HISTORY — DX: Dizziness and giddiness: R42

## 2015-02-01 LAB — COMPREHENSIVE METABOLIC PANEL
ALT: 20 U/L (ref 14–54)
ANION GAP: 15 (ref 5–15)
AST: 26 U/L (ref 15–41)
Albumin: 3.7 g/dL (ref 3.5–5.0)
Alkaline Phosphatase: 70 U/L (ref 38–126)
BUN: 10 mg/dL (ref 6–20)
CALCIUM: 8.9 mg/dL (ref 8.9–10.3)
CO2: 23 mmol/L (ref 22–32)
Chloride: 99 mmol/L — ABNORMAL LOW (ref 101–111)
Creatinine, Ser: 0.67 mg/dL (ref 0.44–1.00)
GFR calc non Af Amer: >60 mL/min (ref >60)
GLUCOSE: 79 mg/dL (ref 65–99)
Potassium: 3 mmol/L — ABNORMAL LOW (ref 3.5–5.1)
SODIUM: 137 mmol/L (ref 135–145)
TOTAL PROTEIN: 7.3 g/dL (ref 6.5–8.1)
Total Bilirubin: 0.3 mg/dL (ref 0.3–1.2)

## 2015-02-01 LAB — CBC
HCT: 39.5 % (ref 36.0–46.0)
Hemoglobin: 13.1 g/dL (ref 12.0–15.0)
MCH: 29.6 pg (ref 26.0–34.0)
MCHC: 33.2 g/dL (ref 30.0–36.0)
MCV: 89.4 fL (ref 78.0–100.0)
Platelets: 337 10*3/uL (ref 150–400)
RBC: 4.42 MIL/uL (ref 3.87–5.11)
RDW: 13.2 % (ref 11.5–15.5)
WBC: 6.6 10*3/uL (ref 4.0–10.5)

## 2015-02-01 LAB — URINALYSIS, ROUTINE W REFLEX MICROSCOPIC
Bilirubin Urine: NEGATIVE
GLUCOSE, UA: NEGATIVE mg/dL
Hgb urine dipstick: NEGATIVE
KETONES UR: NEGATIVE mg/dL
LEUKOCYTES UA: NEGATIVE
Nitrite: NEGATIVE
PH: 7.5 (ref 5.0–8.0)
PROTEIN: NEGATIVE mg/dL
SPECIFIC GRAVITY, URINE: 1.008 (ref 1.005–1.030)
Urobilinogen, UA: 0.2 mg/dL (ref 0.0–1.0)

## 2015-02-01 LAB — PROTIME-INR
INR: 1.01 (ref 0.00–1.49)
Prothrombin Time: 13.5 seconds (ref 11.6–15.2)

## 2015-02-01 LAB — TYPE AND SCREEN
ABO/RH(D): O NEG
ANTIBODY SCREEN: NEGATIVE

## 2015-02-01 LAB — ABO/RH: ABO/RH(D): O NEG

## 2015-02-01 LAB — SURGICAL PCR SCREEN
MRSA, PCR: NEGATIVE
Staphylococcus aureus: NEGATIVE

## 2015-02-01 LAB — APTT: aPTT: 34 seconds (ref 24–37)

## 2015-02-01 NOTE — Pre-Procedure Instructions (Signed)
Theresa Brennan  02/01/2015      Your procedure is scheduled on Monday, February 06, 2015 at 7:30 AM.   Report to [facility] Entrance "A" Admitting Office at 5:30 AM.   Call this number if you have problems the morning of surgery: [phone]   Any questions prior to day of surgery, please call [phone] between 8 & 4 PM.    Remember:  Do not eat food or drink liquids after midnight.  Take these medicines the morning of surgery with A SIP OF WATER: Aspirin, Levothyroxine (Synthroid), Omeprazole (Prilosec), Tylenol with codeine - if needed, Afrin Nasal spray - if needed  Stop Vitamins and Diclofenac as of today.    Do not wear jewelry, make-up or nail polish.  Do not wear lotions, powders, or perfumes.  You may wear deodorant.  Do not shave 48 hours prior to surgery.    Do not bring valuables to the hospital.  [facility] is not responsible for any belongings or valuables.  Contacts, dentures or bridgework may not be worn into surgery.  Leave your suitcase in the car.  After surgery it may be brought to your room.  For patients admitted to the hospital, discharge time will be determined by your treatment team.  Special instructions:  [facility] - Preparing for Surgery  Before surgery, you can play an important role.  Because skin is not sterile, your skin needs to be as free of germs as possible.  You can reduce the number of germs on you skin by washing with CHG (chlorahexidine gluconate) soap before surgery.  CHG is an antiseptic cleaner which kills germs and bonds with the skin to continue killing germs even after washing.  Please DO NOT use if you have an allergy to CHG or antibacterial soaps.  If your skin becomes reddened/irritated stop using the CHG and inform your nurse when you arrive at Short Stay.  Do not shave (including legs and underarms) for at least 48 hours prior to the first CHG shower.  You may shave your face.  Please follow these instructions  carefully:   1.  Shower with CHG Soap the night before surgery and the                                morning of Surgery.  2.  If you choose to wash your hair, wash your hair first as usual with your       normal shampoo.  3.  After you shampoo, rinse your hair and body thoroughly to remove the                      Shampoo.  4.  Use CHG as you would any other liquid soap.  You can apply chg directly       to the skin and wash gently with scrungie or a clean washcloth.  5.  Apply the CHG Soap to your body ONLY FROM THE NECK DOWN.        Do not use on open wounds or open sores.  Avoid contact with your eyes, ears, mouth and genitals (private parts).  Wash genitals (private parts) with your normal soap.  6.  Wash thoroughly, paying special attention to the area where your surgery        will be performed.  7.  Thoroughly rinse your body with warm water from the neck down.  8.  DO NOT shower/wash with your normal soap after using and rinsing off       the CHG Soap.  9.  Pat yourself dry with a clean towel.            10.  Wear clean pajamas.            11.  Place clean sheets on your bed the night of your first shower and do not        sleep with pets.  Day of Surgery  Do not apply any lotions the morning of surgery.  Please wear clean clothes to the hospital.    Please read over the following fact sheets that you were given. Pain Booklet, Coughing and Deep Breathing, Blood Transfusion Information, MRSA Information and Surgical Site Infection Prevention
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
## 2015-02-01 NOTE — Progress Notes (Addendum)
PCP-  Dr. Donnella Sham  Cardiologist- states she has a cardiologist that she seen within the past two weeks but could not state name  Echo- in Epic, 01/24/2015  Cardiac Cath - in Epic, 01/04/2015  Stress Test- Patient states she believes she had stress test in 2007  EKG- in Epic, 01/17/2015  CXR- denies having CXR

## 2015-02-05 NOTE — Anesthesia Preprocedure Evaluation (Addendum)
Anesthesia Evaluation  Patient identified by MRN, date of birth, ID band Patient awake    Reviewed: Allergy & Precautions, NPO status , Patient's Chart, lab work & pertinent test results  History of Anesthesia Complications (+) PONV and history of anesthetic complications  Airway Mallampati: II  TM Distance: >3 FB Neck ROM: Full    Dental no notable dental hx. (+) Dental Advisory Given, Edentulous Upper, Edentulous Lower, Upper Dentures, Lower Dentures   Pulmonary former smoker,  breath sounds clear to auscultation  Pulmonary exam normal       Cardiovascular hypertension, Pt. on medications + Peripheral Vascular Disease Normal cardiovascular examIIRhythm:Regular Rate:Normal  01/04/15 Cardiac cath showed normal coronary anatomy and normal LV function. LVEF 55-65%. No LV wall motion abnormalities noted.   01/24/15 Echo: - Left ventricle: The cavity size was normal. Wall thickness was ormal. Systolic function was normal. The estimated ejection raction was in the range of 55% to 60%. Wall motion was normal; here were no regional wall motion abnormalities. Features areconsistent with a pseudonormal left ventricular filling pattern,with concomitant abnormal relaxation and increased filling pressure (grade 2 diastolic dysfunction). - Mitral valve: There was mild regurgitation. - Pulmonary arteries: PA peak pressure: 31 mm Hg (S).   Neuro/Psych  Headaches, negative psych ROS   GI/Hepatic Neg liver ROS, hiatal hernia, PUD, GERD-  Medicated and Controlled,  Endo/Other  Hypothyroidism   Renal/GU Renal disease  negative genitourinary   Musculoskeletal  (+) Arthritis -,   Abdominal   Peds negative pediatric ROS (+)  Hematology  (+) anemia ,   Anesthesia Other Findings   Reproductive/Obstetrics negative OB ROS                          Anesthesia Physical Anesthesia Plan  ASA: III  Anesthesia Plan:  General   Post-op Pain Management:    Induction: Intravenous  Airway Management Planned: Oral ETT  Additional Equipment: Arterial line  Intra-op Plan:   Post-operative Plan: Extubation in OR  Informed Consent: I have reviewed the patients History and Physical, chart, labs and discussed the procedure including the risks, benefits and alternatives for the proposed anesthesia with the patient or authorized representative who has indicated his/her understanding and acceptance.   Dental advisory given  Plan Discussed with: CRNA  Anesthesia Plan Comments:         Anesthesia Quick Evaluation

## 2015-02-06 ENCOUNTER — Inpatient Hospital Stay (HOSPITAL_COMMUNITY): Payer: Medicare Other | Admitting: Anesthesiology

## 2015-02-06 ENCOUNTER — Inpatient Hospital Stay (HOSPITAL_COMMUNITY)
Admission: RE | Admit: 2015-02-06 | Discharge: 2015-02-08 | DRG: 039 | Disposition: A | Discharge status: Home / Self Care | Payer: Medicare Other | Source: Ambulatory Visit | Attending: Vascular Surgery | Admitting: Vascular Surgery

## 2015-02-06 ENCOUNTER — Encounter (HOSPITAL_COMMUNITY): Payer: Self-pay | Admitting: *Deleted

## 2015-02-06 ENCOUNTER — Inpatient Hospital Stay (HOSPITAL_COMMUNITY): Payer: Medicare Other | Admitting: Vascular Surgery

## 2015-02-06 ENCOUNTER — Encounter (HOSPITAL_COMMUNITY)
Admission: RE | Disposition: A | Discharge status: Home / Self Care | Payer: Self-pay | Source: Ambulatory Visit | Attending: Vascular Surgery

## 2015-02-06 DIAGNOSIS — Z880 Allergy status to penicillin: Secondary | ICD-10-CM | POA: Diagnosis not present

## 2015-02-06 DIAGNOSIS — D649 Anemia, unspecified: Secondary | ICD-10-CM | POA: Diagnosis present

## 2015-02-06 DIAGNOSIS — Z886 Allergy status to analgesic agent status: Secondary | ICD-10-CM | POA: Diagnosis not present

## 2015-02-06 DIAGNOSIS — E780 Pure hypercholesterolemia: Secondary | ICD-10-CM | POA: Diagnosis present

## 2015-02-06 DIAGNOSIS — I9581 Postprocedural hypotension: Secondary | ICD-10-CM | POA: Diagnosis not present

## 2015-02-06 DIAGNOSIS — Z8711 Personal history of peptic ulcer disease: Secondary | ICD-10-CM

## 2015-02-06 DIAGNOSIS — E039 Hypothyroidism, unspecified: Secondary | ICD-10-CM | POA: Diagnosis present

## 2015-02-06 DIAGNOSIS — K219 Gastro-esophageal reflux disease without esophagitis: Secondary | ICD-10-CM | POA: Diagnosis not present

## 2015-02-06 DIAGNOSIS — Z87891 Personal history of nicotine dependence: Secondary | ICD-10-CM | POA: Diagnosis not present

## 2015-02-06 DIAGNOSIS — Z7982 Long term (current) use of aspirin: Secondary | ICD-10-CM | POA: Diagnosis not present

## 2015-02-06 DIAGNOSIS — Z823 Family history of stroke: Secondary | ICD-10-CM

## 2015-02-06 DIAGNOSIS — I6529 Occlusion and stenosis of unspecified carotid artery: Secondary | ICD-10-CM | POA: Diagnosis present

## 2015-02-06 DIAGNOSIS — I6523 Occlusion and stenosis of bilateral carotid arteries: Secondary | ICD-10-CM | POA: Diagnosis not present

## 2015-02-06 DIAGNOSIS — Z885 Allergy status to narcotic agent status: Secondary | ICD-10-CM

## 2015-02-06 DIAGNOSIS — I129 Hypertensive chronic kidney disease with stage 1 through stage 4 chronic kidney disease, or unspecified chronic kidney disease: Secondary | ICD-10-CM | POA: Diagnosis not present

## 2015-02-06 DIAGNOSIS — Z888 Allergy status to other drugs, medicaments and biological substances status: Secondary | ICD-10-CM

## 2015-02-06 DIAGNOSIS — N189 Chronic kidney disease, unspecified: Secondary | ICD-10-CM | POA: Diagnosis present

## 2015-02-06 DIAGNOSIS — I6521 Occlusion and stenosis of right carotid artery: Secondary | ICD-10-CM | POA: Diagnosis not present

## 2015-02-06 DIAGNOSIS — K449 Diaphragmatic hernia without obstruction or gangrene: Secondary | ICD-10-CM | POA: Diagnosis present

## 2015-02-06 DIAGNOSIS — M1990 Unspecified osteoarthritis, unspecified site: Secondary | ICD-10-CM | POA: Diagnosis not present

## 2015-02-06 DIAGNOSIS — E876 Hypokalemia: Secondary | ICD-10-CM | POA: Diagnosis present

## 2015-02-06 DIAGNOSIS — G43909 Migraine, unspecified, not intractable, without status migrainosus: Secondary | ICD-10-CM | POA: Diagnosis not present

## 2015-02-06 DIAGNOSIS — Z8249 Family history of ischemic heart disease and other diseases of the circulatory system: Secondary | ICD-10-CM

## 2015-02-06 DIAGNOSIS — Z90710 Acquired absence of both cervix and uterus: Secondary | ICD-10-CM | POA: Diagnosis not present

## 2015-02-06 HISTORY — PX: ENDARTERECTOMY: SHX5162

## 2015-02-06 SURGERY — ENDARTERECTOMY, CAROTID
Anesthesia: General | Site: Neck | Laterality: Right

## 2015-02-06 MED ORDER — ONDANSETRON HCL 4 MG/2ML IJ SOLN
4.0000 mg | Freq: Four times a day (QID) | INTRAMUSCULAR | Status: DC | PRN
  Administered 2015-02-06: 4 mg via INTRAVENOUS
  Filled 2015-02-06: qty 2

## 2015-02-06 MED ORDER — LABETALOL HCL 5 MG/ML IV SOLN
INTRAVENOUS | Status: AC
  Filled 2015-02-06: qty 4

## 2015-02-06 MED ORDER — NEOSTIGMINE METHYLSULFATE 10 MG/10ML IV SOLN
INTRAVENOUS | Status: DC | PRN
  Administered 2015-02-06: 4 mg via INTRAVENOUS

## 2015-02-06 MED ORDER — THROMBIN 20000 UNITS EX SOLR
CUTANEOUS | Status: AC
  Filled 2015-02-06: qty 20000

## 2015-02-06 MED ORDER — PHENOL 1.4 % MT LIQD
1.0000 | OROMUCOSAL | Status: DC | PRN
  Filled 2015-02-06 (×2): qty 177

## 2015-02-06 MED ORDER — LEVOTHYROXINE SODIUM 50 MCG PO TABS
50.0000 ug | ORAL_TABLET | Freq: Every day | ORAL | Status: DC
  Administered 2015-02-07: 50 ug via ORAL
  Filled 2015-02-06 (×3): qty 1

## 2015-02-06 MED ORDER — TRAMADOL HCL 50 MG PO TABS
50.0000 mg | ORAL_TABLET | Freq: Four times a day (QID) | ORAL | Status: DC | PRN
  Administered 2015-02-07 – 2015-02-08 (×2): 50 mg via ORAL
  Filled 2015-02-06 (×2): qty 1

## 2015-02-06 MED ORDER — POTASSIUM CHLORIDE CRYS ER 20 MEQ PO TBCR
20.0000 meq | EXTENDED_RELEASE_TABLET | Freq: Every day | ORAL | Status: AC | PRN
  Administered 2015-02-07: 40 meq via ORAL
  Filled 2015-02-06: qty 2

## 2015-02-06 MED ORDER — DOCUSATE SODIUM 100 MG PO CAPS
100.0000 mg | ORAL_CAPSULE | Freq: Every day | ORAL | Status: DC
  Administered 2015-02-07 – 2015-02-08 (×2): 100 mg via ORAL
  Filled 2015-02-06 (×2): qty 1

## 2015-02-06 MED ORDER — LACTATED RINGERS IV SOLN
INTRAVENOUS | Status: DC | PRN
  Administered 2015-02-06: 07:00:00 via INTRAVENOUS

## 2015-02-06 MED ORDER — VANCOMYCIN HCL IN DEXTROSE 1-5 GM/200ML-% IV SOLN
1000.0000 mg | INTRAVENOUS | Status: AC
  Administered 2015-02-06: 1000 mg via INTRAVENOUS

## 2015-02-06 MED ORDER — OXYMETAZOLINE HCL 0.05 % NA SOLN: 1.0000 | Freq: Two times a day (BID) | NASAL | Status: DC | PRN

## 2015-02-06 MED ORDER — CHLORHEXIDINE GLUCONATE CLOTH 2 % EX PADS: 6.0000 | MEDICATED_PAD | Freq: Once | CUTANEOUS | Status: DC

## 2015-02-06 MED ORDER — PANTOPRAZOLE SODIUM 40 MG PO TBEC
40.0000 mg | DELAYED_RELEASE_TABLET | Freq: Every day | ORAL | Status: DC
  Administered 2015-02-07 – 2015-02-08 (×2): 40 mg via ORAL
  Filled 2015-02-06: qty 1

## 2015-02-06 MED ORDER — MIDAZOLAM HCL 2 MG/2ML IJ SOLN
INTRAMUSCULAR | Status: AC
  Filled 2015-02-06: qty 2

## 2015-02-06 MED ORDER — PROPOFOL 10 MG/ML IV BOLUS
INTRAVENOUS | Status: DC | PRN
  Administered 2015-02-06: 60 mg via INTRAVENOUS
  Administered 2015-02-06: 70 mg via INTRAVENOUS
  Administered 2015-02-06: 160 mg via INTRAVENOUS

## 2015-02-06 MED ORDER — FENTANYL CITRATE (PF) 100 MCG/2ML IJ SOLN
INTRAMUSCULAR | Status: AC
  Filled 2015-02-06: qty 2

## 2015-02-06 MED ORDER — PROPOFOL 10 MG/ML IV BOLUS
INTRAVENOUS | Status: AC
  Filled 2015-02-06: qty 20

## 2015-02-06 MED ORDER — ONDANSETRON HCL 4 MG/2ML IJ SOLN
INTRAMUSCULAR | Status: DC | PRN
  Administered 2015-02-06: 4 mg via INTRAVENOUS

## 2015-02-06 MED ORDER — LIDOCAINE HCL (CARDIAC) 20 MG/ML IV SOLN
INTRAVENOUS | Status: DC | PRN
  Administered 2015-02-06: 80 mg via INTRAVENOUS

## 2015-02-06 MED ORDER — HYDRALAZINE HCL 20 MG/ML IJ SOLN: 5.0000 mg | INTRAMUSCULAR | Status: DC | PRN

## 2015-02-06 MED ORDER — LIDOCAINE HCL (CARDIAC) 20 MG/ML IV SOLN
INTRAVENOUS | Status: AC
  Filled 2015-02-06: qty 5

## 2015-02-06 MED ORDER — PHENYLEPHRINE HCL 10 MG/ML IJ SOLN
0.0000 ug/min | INTRAVENOUS | Status: DC
  Filled 2015-02-06: qty 1

## 2015-02-06 MED ORDER — VANCOMYCIN HCL IN DEXTROSE 1-5 GM/200ML-% IV SOLN
1000.0000 mg | Freq: Two times a day (BID) | INTRAVENOUS | Status: AC
  Administered 2015-02-06 – 2015-02-07 (×2): 1000 mg via INTRAVENOUS
  Filled 2015-02-06 (×2): qty 200

## 2015-02-06 MED ORDER — PHENYLEPHRINE HCL 10 MG/ML IJ SOLN
20.0000 ug/min | INTRAVENOUS | Status: DC
  Filled 2015-02-06: qty 1

## 2015-02-06 MED ORDER — 0.9 % SODIUM CHLORIDE (POUR BTL) OPTIME
TOPICAL | Status: DC | PRN
  Administered 2015-02-06: 2000 mL

## 2015-02-06 MED ORDER — LIDOCAINE HCL (PF) 1 % IJ SOLN
INTRAMUSCULAR | Status: AC
  Filled 2015-02-06: qty 30

## 2015-02-06 MED ORDER — METOPROLOL TARTRATE 1 MG/ML IV SOLN
2.0000 mg | INTRAVENOUS | Status: DC | PRN
Start: 2015-02-06 — End: 2015-02-08

## 2015-02-06 MED ORDER — FENTANYL CITRATE (PF) 100 MCG/2ML IJ SOLN: 25.0000 ug | INTRAMUSCULAR | Status: DC | PRN

## 2015-02-06 MED ORDER — TRIAMTERENE-HCTZ 37.5-25 MG PO CAPS
1.0000 | ORAL_CAPSULE | Freq: Every day | ORAL | Status: DC
  Administered 2015-02-07: 1 via ORAL
  Filled 2015-02-06 (×3): qty 1

## 2015-02-06 MED ORDER — PROTAMINE SULFATE 10 MG/ML IV SOLN
INTRAVENOUS | Status: AC
  Filled 2015-02-06: qty 10

## 2015-02-06 MED ORDER — TRAMADOL HCL 50 MG PO TABS: 50.0000 mg | ORAL_TABLET | Freq: Four times a day (QID) | ORAL | Status: DC | PRN

## 2015-02-06 MED ORDER — GUAIFENESIN-DM 100-10 MG/5ML PO SYRP
15.0000 mL | ORAL_SOLUTION | ORAL | Status: DC | PRN
  Filled 2015-02-06 (×2): qty 15

## 2015-02-06 MED ORDER — MIDAZOLAM HCL 5 MG/5ML IJ SOLN
INTRAMUSCULAR | Status: DC | PRN
  Administered 2015-02-06: 2 mg via INTRAVENOUS

## 2015-02-06 MED ORDER — ONDANSETRON HCL 4 MG/2ML IJ SOLN
INTRAMUSCULAR | Status: AC
  Filled 2015-02-06: qty 2

## 2015-02-06 MED ORDER — ADULT MULTIVITAMIN W/MINERALS CH
1.0000 | ORAL_TABLET | Freq: Every day | ORAL | Status: DC
  Administered 2015-02-07 – 2015-02-08 (×2): 1 via ORAL
  Filled 2015-02-06 (×2): qty 1

## 2015-02-06 MED ORDER — ROCURONIUM BROMIDE 50 MG/5ML IV SOLN
INTRAVENOUS | Status: AC
  Filled 2015-02-06: qty 1

## 2015-02-06 MED ORDER — ASPIRIN EC 325 MG PO TBEC
325.0000 mg | DELAYED_RELEASE_TABLET | Freq: Every day | ORAL | Status: DC
  Administered 2015-02-07 – 2015-02-08 (×2): 325 mg via ORAL
  Filled 2015-02-06 (×2): qty 1

## 2015-02-06 MED ORDER — ALUM & MAG HYDROXIDE-SIMETH 200-200-20 MG/5ML PO SUSP
15.0000 mL | ORAL | Status: DC | PRN
  Administered 2015-02-07: 30 mL via ORAL
  Filled 2015-02-06: qty 30

## 2015-02-06 MED ORDER — SODIUM CHLORIDE 0.9 % IV SOLN
500.0000 mL | Freq: Once | INTRAVENOUS | Status: AC | PRN
  Administered 2015-02-06: 500 mL via INTRAVENOUS

## 2015-02-06 MED ORDER — DOPAMINE-DEXTROSE 3.2-5 MG/ML-% IV SOLN
2.5000 ug/kg/min | INTRAVENOUS | Status: DC
  Administered 2015-02-06: 8 ug/kg/min via INTRAVENOUS
  Filled 2015-02-06: qty 250

## 2015-02-06 MED ORDER — SODIUM CHLORIDE 0.9 % IV BOLUS (SEPSIS): 500.0000 mL | Freq: Once | INTRAVENOUS | Status: DC

## 2015-02-06 MED ORDER — PROTAMINE SULFATE 10 MG/ML IV SOLN
INTRAVENOUS | Status: DC | PRN
  Administered 2015-02-06: 50 mg via INTRAVENOUS
  Administered 2015-02-06: 10 mg via INTRAVENOUS

## 2015-02-06 MED ORDER — PHENYLEPHRINE HCL 10 MG/ML IJ SOLN
10.0000 mg | INTRAVENOUS | Status: DC | PRN
  Administered 2015-02-06: 30 ug/min via INTRAVENOUS

## 2015-02-06 MED ORDER — CYCLOBENZAPRINE HCL 5 MG PO TABS
5.0000 mg | ORAL_TABLET | Freq: Every day | ORAL | Status: DC
  Administered 2015-02-06 – 2015-02-07 (×2): 5 mg via ORAL
  Filled 2015-02-06 (×3): qty 1

## 2015-02-06 MED ORDER — ONDANSETRON HCL 4 MG/2ML IJ SOLN
4.0000 mg | Freq: Once | INTRAMUSCULAR | Status: DC | PRN
Start: 2015-02-06 — End: 2015-02-06

## 2015-02-06 MED ORDER — HEPARIN SODIUM (PORCINE) 1000 UNIT/ML IJ SOLN
INTRAMUSCULAR | Status: DC | PRN
  Administered 2015-02-06: 7000 [IU] via INTRAVENOUS

## 2015-02-06 MED ORDER — FENTANYL CITRATE (PF) 250 MCG/5ML IJ SOLN
INTRAMUSCULAR | Status: AC
  Filled 2015-02-06: qty 5

## 2015-02-06 MED ORDER — PHENYLEPHRINE HCL 10 MG/ML IJ SOLN
INTRAMUSCULAR | Status: DC | PRN
  Administered 2015-02-06 (×7): 80 ug via INTRAVENOUS
  Administered 2015-02-06: 40 ug via INTRAVENOUS
  Administered 2015-02-06: 80 ug via INTRAVENOUS
  Administered 2015-02-06: 160 ug via INTRAVENOUS

## 2015-02-06 MED ORDER — ROCURONIUM BROMIDE 100 MG/10ML IV SOLN
INTRAVENOUS | Status: DC | PRN
  Administered 2015-02-06: 40 mg via INTRAVENOUS

## 2015-02-06 MED ORDER — SODIUM CHLORIDE 0.9 % IV SOLN: INTRAVENOUS | Status: DC

## 2015-02-06 MED ORDER — HEPARIN SODIUM (PORCINE) 1000 UNIT/ML IJ SOLN
INTRAMUSCULAR | Status: AC
  Filled 2015-02-06: qty 1

## 2015-02-06 MED ORDER — FERROUS SULFATE 325 (65 FE) MG PO TABS
325.0000 mg | ORAL_TABLET | Freq: Every day | ORAL | Status: DC
  Administered 2015-02-07 – 2015-02-08 (×2): 325 mg via ORAL
  Filled 2015-02-06 (×3): qty 1

## 2015-02-06 MED ORDER — FENTANYL CITRATE (PF) 100 MCG/2ML IJ SOLN
25.0000 ug | INTRAMUSCULAR | Status: DC | PRN
  Administered 2015-02-06 (×4): 25 ug via INTRAVENOUS

## 2015-02-06 MED ORDER — ONDANSETRON HCL 4 MG/2ML IJ SOLN: 4.0000 mg | Freq: Once | INTRAMUSCULAR | Status: DC | PRN

## 2015-02-06 MED ORDER — EPHEDRINE SULFATE 50 MG/ML IJ SOLN
INTRAMUSCULAR | Status: DC | PRN
  Administered 2015-02-06: 5 mg via INTRAVENOUS
  Administered 2015-02-06: 10 mg via INTRAVENOUS
  Administered 2015-02-06: 5 mg via INTRAVENOUS
  Administered 2015-02-06: 10 mg via INTRAVENOUS
  Administered 2015-02-06: 5 mg via INTRAVENOUS
  Administered 2015-02-06: 10 mg via INTRAVENOUS

## 2015-02-06 MED ORDER — HYDROMORPHONE HCL 1 MG/ML IJ SOLN
0.5000 mg | INTRAMUSCULAR | Status: DC | PRN
  Administered 2015-02-06 – 2015-02-08 (×3): 0.5 mg via INTRAVENOUS
  Filled 2015-02-06 (×3): qty 1

## 2015-02-06 MED ORDER — ATORVASTATIN CALCIUM 40 MG PO TABS
40.0000 mg | ORAL_TABLET | Freq: Every day | ORAL | Status: DC
  Administered 2015-02-07: 40 mg via ORAL
  Filled 2015-02-06 (×3): qty 1

## 2015-02-06 MED ORDER — WOMENS MULTIVITAMIN PLUS PO TABS: 1.0000 | ORAL_TABLET | Freq: Every day | ORAL | Status: DC

## 2015-02-06 MED ORDER — EPHEDRINE SULFATE 50 MG/ML IJ SOLN
INTRAMUSCULAR | Status: AC
  Filled 2015-02-06: qty 1

## 2015-02-06 MED ORDER — FENTANYL CITRATE (PF) 100 MCG/2ML IJ SOLN
INTRAMUSCULAR | Status: DC | PRN
  Administered 2015-02-06: 50 ug via INTRAVENOUS
  Administered 2015-02-06 (×2): 100 ug via INTRAVENOUS
  Administered 2015-02-06 (×3): 50 ug via INTRAVENOUS

## 2015-02-06 MED ORDER — LABETALOL HCL 5 MG/ML IV SOLN
10.0000 mg | INTRAVENOUS | Status: DC | PRN
  Filled 2015-02-06: qty 4

## 2015-02-06 MED ORDER — GLYCOPYRROLATE 0.2 MG/ML IJ SOLN
INTRAMUSCULAR | Status: DC | PRN
  Administered 2015-02-06: 0.6 mg via INTRAVENOUS

## 2015-02-06 MED ORDER — SODIUM CHLORIDE 0.9 % IV SOLN
INTRAVENOUS | Status: DC
  Administered 2015-02-06: 20:00:00 via INTRAVENOUS

## 2015-02-06 MED ORDER — SODIUM CHLORIDE 0.9 % IJ SOLN
INTRAMUSCULAR | Status: AC
  Filled 2015-02-06: qty 10

## 2015-02-06 MED ORDER — SODIUM CHLORIDE 0.9 % IR SOLN
Status: DC | PRN
  Administered 2015-02-06: 500 mL

## 2015-02-06 SURGICAL SUPPLY — 47 items
CANISTER SUCTION 2500CC (MISCELLANEOUS) ×2 IMPLANT
CANNULA VESSEL 3MM 2 BLNT TIP (CANNULA) ×2 IMPLANT
CATH ROBINSON RED A/P 18FR (CATHETERS) ×2 IMPLANT
CLIP TI MEDIUM 6 (CLIP) ×2 IMPLANT
CLIP TI WIDE RED SMALL 6 (CLIP) ×2 IMPLANT
CRADLE DONUT ADULT HEAD (MISCELLANEOUS) ×2 IMPLANT
DECANTER SPIKE VIAL GLASS SM (MISCELLANEOUS) IMPLANT
DRAIN HEMOVAC 1/8 X 5 (WOUND CARE) IMPLANT
ELECT REM PT RETURN 9FT ADLT (ELECTROSURGICAL) ×2
ELECTRODE REM PT RTRN 9FT ADLT (ELECTROSURGICAL) ×1 IMPLANT
EVACUATOR SILICONE 100CC (DRAIN) IMPLANT
GAUZE SPONGE 4X4 12PLY STRL (GAUZE/BANDAGES/DRESSINGS) IMPLANT
GEL ULTRASOUND 20GR AQUASONIC (MISCELLANEOUS) IMPLANT
GLOVE BIO SURGEON STRL SZ 6.5 (GLOVE) ×4 IMPLANT
GLOVE BIO SURGEON STRL SZ7.5 (GLOVE) ×2 IMPLANT
GLOVE BIOGEL PI IND STRL 6.5 (GLOVE) ×1 IMPLANT
GLOVE BIOGEL PI IND STRL 7.0 (GLOVE) ×1 IMPLANT
GLOVE BIOGEL PI INDICATOR 6.5 (GLOVE) ×1
GLOVE BIOGEL PI INDICATOR 7.0 (GLOVE) ×1
GLOVE ECLIPSE 6.5 STRL STRAW (GLOVE) ×2 IMPLANT
GLOVE SURG SS PI 7.0 STRL IVOR (GLOVE) ×2 IMPLANT
GOWN STRL REUS W/ TWL LRG LVL3 (GOWN DISPOSABLE) ×3 IMPLANT
GOWN STRL REUS W/TWL LRG LVL3 (GOWN DISPOSABLE) ×3
KIT BASIN OR (CUSTOM PROCEDURE TRAY) ×2 IMPLANT
KIT ROOM TURNOVER OR (KITS) ×2 IMPLANT
LIQUID BAND (GAUZE/BANDAGES/DRESSINGS) ×2 IMPLANT
LOOP VESSEL MINI RED (MISCELLANEOUS) IMPLANT
NEEDLE HYPO 25GX1X1/2 BEV (NEEDLE) IMPLANT
NS IRRIG 1000ML POUR BTL (IV SOLUTION) ×4 IMPLANT
PACK CAROTID (CUSTOM PROCEDURE TRAY) ×2 IMPLANT
PAD ARMBOARD 7.5X6 YLW CONV (MISCELLANEOUS) ×4 IMPLANT
PATCH HEMASHIELD 8X75 (Vascular Products) ×2 IMPLANT
SHUNT CAROTID BYPASS 10 (VASCULAR PRODUCTS) ×2 IMPLANT
SHUNT CAROTID BYPASS 12FRX15.5 (VASCULAR PRODUCTS) IMPLANT
SPONGE INTESTINAL PEANUT (DISPOSABLE) IMPLANT
SPONGE SURGIFOAM ABS GEL 100 (HEMOSTASIS) IMPLANT
SUT ETHILON 3 0 PS 1 (SUTURE) IMPLANT
SUT PROLENE 6 0 CC (SUTURE) ×16 IMPLANT
SUT PROLENE 7 0 BV 1 (SUTURE) IMPLANT
SUT PROLENE 7 0 BV1 MDA (SUTURE) ×2 IMPLANT
SUT SILK 3 0 TIES 17X18 (SUTURE)
SUT SILK 3-0 18XBRD TIE BLK (SUTURE) IMPLANT
SUT VIC AB 3-0 SH 27 (SUTURE) ×1
SUT VIC AB 3-0 SH 27X BRD (SUTURE) ×1 IMPLANT
SUT VICRYL 4-0 PS2 18IN ABS (SUTURE) ×2 IMPLANT
SYR CONTROL 10ML LL (SYRINGE) IMPLANT
WATER STERILE IRR 1000ML POUR (IV SOLUTION) ×2 IMPLANT

## 2015-02-06 NOTE — Op Note (Signed)
Procedure: Right carotid endarterectomy  Preoperative diagnosis: High-grade asymptomatic right internal carotid artery stenosis  Postoperative diagnosis: Same  Anesthesia General  Asst.: Samantha Rhyne PAC  Operative findings: #1 greater than 80% right internal carotid stenosis                                                             #2 Dacron patch           #3 10 Fr shunt  Operative details: After obtaining informed consent, the patient was taken to the operating room. The patient was placed in a supine position on the operating room table. After induction of general anesthesia, the patient's entire neck and chest was prepped and draped in the usual sterile fashion. An oblique incision was made on the right aspect of the patient's neck anterior to the border the right sternocleidomastoid muscle. The incision was carried into the subcutaneous tissues and through the platysma. The sternocleidomastoid muscle was identified and reflected laterally. The omohyoid muscle was identified and this was divided with cautery. The common carotid artery was then found at the base of the incision this was dissected free circumferentially. It was fairly soft on palpation.  The vagus nerve was identified and protected. The patient's carotid artery was rotated placing the external carotid into a more lateral position and the internal more medial.  Dissection was then carried up to the level carotid bifurcation.   The hyperglossal nerve was above the primary area of dissection but we did have to put some traction on this to expose the distal end of the internal carotid when were sewing the patch and inserting the shunt. The internal carotid artery was dissected free circumferentially just below the level of the hypoglossal nerve and it was soft in character at this location and above any palpable disease. A vessel loop was placed around this. The vessel was fairly small. Next the external carotid and superior thyroid  arteries were dissected free circumferentially and vessel loops were placed around these. The patient was given 7000 units of intravenous heparin.  After 2 minutes of circulation time and raising the mean arterial pressure to 90 mm mercury, the distal internal carotid artery was controlled with small bulldog clamp. The external carotid and superior thyroid arteries were controlled with vessel loops. The common carotid artery was controlled with a peripheral DeBakey clamp. A longitudinal opening was made in the common carotid artery just below the bifurcation. The arteriotomy was extended distally up into the internal carotid with Potts scissors. There was a large rubbery type plaque with greater than 80% stenosis in the internal carotid overall fairly focal.  A 10 Fr shunt was brought onto the field and fashioned to fit the patient's artery.  This was threaded into the distal internal carotid artery and allowed to backbleed thoroughly.  There was good pulsatile backbleeding.  This was then threaded into the common carotid and secured with a Rummel tourniquet.   There was no air at this point and flow was restored to the brain.  Attention was then turned to the common carotid artery once again. A suitable endarterectomy plane was obtained and endarterectomy was begun in the common carotid artery and a good proximal endpoint was obtained. An eversion endarterectomy was performed on the external carotid artery and a  good endpoint was obtained. The plaque was then elevated in the internal carotid artery and a good endpoint was also obtained. There was a slight stepoff and this was tacked on the posterior wall with several 7 0 prolene sutures. The plaque was passed off the table. All loose debris was then removed from the carotid bed and everything was thoroughly irrigated with heparinized saline. A Dacron patch was then brought on to the operative field and this was sewn on as a patch angioplasty using a running 6-0  Prolene suture. Prior to completion of the anastomosis the internal carotid artery was thoroughly backbled. This was then controlled again with a fine bulldog clamp.  The common carotid was thoroughly flushed forward. The external carotid was also thoroughly backbled.  The remainder of the patch was completed and the anastomosis was secured. Flow was then restored first retrograde from the external carotid into the carotid bed then antegrade from the common carotid to the external carotid artery and after approximately 5 cardiac cycles to the internal carotid artery. Doppler was used to evaluate the external/internal and common carotid arteries and these all had good Doppler flow. Hemostasis was obtained with 2 additional repair sutures. The patient was also given 70 mg of Protamine.      The platysma muscle was reapproximated using a running 3-0 Vicryl suture. The skin was closed with 4 0 Vicryl subcuticular stitch.  The patient was awakened in the operating room and was moving upper and lower extremities symmetrically and following commands.  The patient was stable on arrival to the PACU.  Fabienne Bruns, MD [facility] [place] Office: [phone] Pager: [phone]

## 2015-02-06 NOTE — Anesthesia Procedure Notes (Signed)
Procedure Name: Intubation Performed by: Tonee Silverstein J Pre-anesthesia Checklist: Patient identified, Emergency Drugs available, Suction available, Patient being monitored and Timeout performed Patient Re-evaluated:Patient Re-evaluated prior to inductionOxygen Delivery Method: Circle system utilized Preoxygenation: Pre-oxygenation with 100% oxygen Intubation Type: IV induction Ventilation: Mask ventilation without difficulty Laryngoscope Size: Mac and 3 Grade View: Grade I Tube type: Oral Tube size: 7.0 mm Number of attempts: 1 Airway Equipment and Method: Stylet Placement Confirmation: ETT inserted through vocal cords under direct vision,  positive ETCO2,  CO2 detector and breath sounds checked- equal and bilateral Secured at: 21 cm Tube secured with: Tape Dental Injury: Teeth and Oropharynx as per pre-operative assessment        
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
## 2015-02-06 NOTE — Progress Notes (Signed)
      PACU patient was hypotensive and Dr. Gentry Roch started Neo.   I gave the patient 500cc bolus follwed by 1000 cc bolus and the dopamine was started and is currently running at 7 mcg.  BP is stable currently at 151/80.  The patient's biggest complaint is nausea currently Zofran.    Tongue deviation is improving to the right, smile is symmetric Right neck incision without hematoma Grip 5/5 equal Speech is clear  S/P right CEA Disposition stable  Meera Vasco MAUREEN PA-C

## 2015-02-06 NOTE — Progress Notes (Signed)
Neo gtt increased to 50 mcg/min 

## 2015-02-06 NOTE — Progress Notes (Signed)
Decreased Neo to 84mcg/min

## 2015-02-06 NOTE — Progress Notes (Addendum)
Neo gtt started at 40 mcg/min

## 2015-02-06 NOTE — Anesthesia Postprocedure Evaluation (Signed)
  Anesthesia Post-op Note  Patient: Theresa Brennan  Procedure(s) Performed: Procedure(s): RIGHT CAROTID ENDARTERECTOMY  (Right)  Patient Location: PACU  Anesthesia Type: General   Level of Consciousness: awake, alert  and oriented  Airway and Oxygen Therapy: Patient Spontanous Breathing  Post-op Pain: mild  Post-op Assessment: Post-op Vital signs reviewed  Post-op Vital Signs: Reviewed  Last Vitals:  Filed Vitals:   02/06/15 1900  BP: 106/43  Pulse: 98  Temp:   Resp: 13    Complications: No apparent anesthesia complications

## 2015-02-06 NOTE — Interval H&P Note (Signed)
History and Physical Interval Note:  02/06/2015 7:21 AM  Theresa Brennan  has presented today for surgery, with the diagnosis of Right carotid artery stenosis I65.21  The various methods of treatment have been discussed with the patient and family. After consideration of risks, benefits and other options for treatment, the patient has consented to  Procedure(s): RIGHT CAROTID ENDARTERECTOMY  (Right) as a surgical intervention .  The patient's history has been reviewed, patient examined, no change in status, stable for surgery.  I have reviewed the patient's chart and labs.  Questions were answered to the patient's satisfaction.     Fabienne Bruns

## 2015-02-06 NOTE — Transfer of Care (Signed)
Immediate Anesthesia Transfer of Care Note  Patient: Theresa Brennan  Procedure(s) Performed: Procedure(s): RIGHT CAROTID ENDARTERECTOMY  (Right)  Patient Location: PACU  Anesthesia Type:General  Level of Consciousness: awake, alert  and oriented  Airway & Oxygen Therapy: Patient Spontanous Breathing and Patient connected to nasal cannula oxygen  Post-op Assessment: Report given to RN and Post -op Vital signs reviewed and stable  Post vital signs: Reviewed and stable  Last Vitals:  Filed Vitals:   02/06/15 1100  BP: 117/43  Pulse: 90  Temp:   Resp: 13    Complications: No apparent anesthesia complications

## 2015-02-07 ENCOUNTER — Telehealth: Payer: Self-pay | Admitting: Vascular Surgery

## 2015-02-07 ENCOUNTER — Encounter (HOSPITAL_COMMUNITY): Payer: Self-pay | Admitting: Vascular Surgery

## 2015-02-07 LAB — BASIC METABOLIC PANEL
Anion gap: 9 (ref 5–15)
Anion gap: 7 (ref 5–15)
BUN: <5 mg/dL — ABNORMAL LOW (ref 6–20)
CALCIUM: 7.4 mg/dL — AB (ref 8.9–10.3)
CHLORIDE: 100 mmol/L — AB (ref 101–111)
CO2: 27 mmol/L (ref 22–32)
CO2: 29 mmol/L (ref 22–32)
CREATININE: 0.56 mg/dL (ref 0.44–1.00)
Calcium: 7.4 mg/dL — ABNORMAL LOW (ref 8.9–10.3)
Chloride: 101 mmol/L (ref 101–111)
Creatinine, Ser: 0.55 mg/dL (ref 0.44–1.00)
GFR calc Af Amer: >60 mL/min (ref >60)
GFR calc non Af Amer: >60 mL/min (ref >60)
GLUCOSE: 107 mg/dL — AB (ref 65–99)
Glucose, Bld: 124 mg/dL — ABNORMAL HIGH (ref 65–99)
POTASSIUM: 2.3 mmol/L — AB (ref 3.5–5.1)
Potassium: 2.9 mmol/L — ABNORMAL LOW (ref 3.5–5.1)
SODIUM: 137 mmol/L (ref 135–145)
Sodium: 136 mmol/L (ref 135–145)

## 2015-02-07 LAB — CBC
HEMATOCRIT: 29.3 % — AB (ref 36.0–46.0)
HEMOGLOBIN: 9.6 g/dL — AB (ref 12.0–15.0)
MCH: 29.3 pg (ref 26.0–34.0)
MCHC: 32.8 g/dL (ref 30.0–36.0)
MCV: 89.3 fL (ref 78.0–100.0)
Platelets: 278 10*3/uL (ref 150–400)
RBC: 3.28 MIL/uL — ABNORMAL LOW (ref 3.87–5.11)
RDW: 13.7 % (ref 11.5–15.5)
WBC: 7.9 10*3/uL (ref 4.0–10.5)

## 2015-02-07 LAB — POTASSIUM: Potassium: 2.6 mmol/L — CL (ref 3.5–5.1)

## 2015-02-07 MED ORDER — SUMATRIPTAN SUCCINATE 100 MG PO TABS
100.0000 mg | ORAL_TABLET | ORAL | Status: DC | PRN
  Administered 2015-02-07: 100 mg via ORAL
  Filled 2015-02-07: qty 1

## 2015-02-07 MED ORDER — POTASSIUM CHLORIDE CRYS ER 10 MEQ PO TBCR
EXTENDED_RELEASE_TABLET | ORAL | Status: AC
  Filled 2015-02-07: qty 4

## 2015-02-07 MED ORDER — POTASSIUM CHLORIDE CRYS ER 20 MEQ PO TBCR
20.0000 meq | EXTENDED_RELEASE_TABLET | Freq: Once | ORAL | Status: AC
  Administered 2015-02-07: 20 meq via ORAL
  Filled 2015-02-07: qty 1

## 2015-02-07 MED ORDER — POTASSIUM CHLORIDE CRYS ER 20 MEQ PO TBCR
40.0000 meq | EXTENDED_RELEASE_TABLET | Freq: Once | ORAL | Status: AC
  Administered 2015-02-07: 40 meq via ORAL

## 2015-02-07 MED ORDER — POTASSIUM CHLORIDE CRYS ER 20 MEQ PO TBCR
20.0000 meq | EXTENDED_RELEASE_TABLET | Freq: Once | ORAL | Status: AC
  Administered 2015-02-08: 20 meq via ORAL
  Filled 2015-02-07: qty 1

## 2015-02-07 MED ORDER — POTASSIUM GLUCONATE 595 MG PO CAPS: 595.0000 mg | ORAL_CAPSULE | Freq: Every day | ORAL | Status: DC

## 2015-02-07 MED ORDER — POTASSIUM CHLORIDE CRYS ER 20 MEQ PO TBCR
40.0000 meq | EXTENDED_RELEASE_TABLET | Freq: Once | ORAL | Status: AC
  Administered 2015-02-07: 40 meq via ORAL
  Filled 2015-02-07: qty 2

## 2015-02-07 NOTE — Telephone Encounter (Signed)
-----   Message from Sharee Pimple, RN sent at 02/06/2015 11:22 AM EDT ----- Regarding: Schedule   ----- Message -----    From: Dara Lords, PA-C    Sent: 02/06/2015  10:27 AM      To: [facility] Charge Pool  S/p right CEA 02/06/15.  F/u with Dr. Darrick Penna in 2 weeks.  Thanks, Lelon Mast

## 2015-02-07 NOTE — Progress Notes (Signed)
Pt received to 2w07, alert and oriented x4, vitals stable. Pt oriented to the unit, call bell within reach, will continue to monitor pt.

## 2015-02-07 NOTE — Progress Notes (Signed)
Lianne Cure PA updated on K level, interventions, recheck. Informed of headache this morning with history of migraines. Also updated on overall condition.

## 2015-02-07 NOTE — Progress Notes (Signed)
CRITICAL VALUE ALERT  Critical value received:  K 3.2  Date of notification:  02/07/15  Time of notification: 0409   Critical value read back:Yes.    Nurse who received alert:  Holland Falling RN  MD notified (1st page):  Not notified, used prn replacement order  Time of first page:  n/a  MD notified (2nd page):  Time of second page:  Responding MD:  N/a  Time MD responded:  N/a  Will recheck level after dose.

## 2015-02-07 NOTE — Telephone Encounter (Signed)
LM for pt re appt, dpm °

## 2015-02-07 NOTE — Progress Notes (Signed)
MD notified of 2.9 potassium level. New orders received. Will continue to monitor closely.  Mykael Trott Doyne Keel

## 2015-02-07 NOTE — Progress Notes (Addendum)
[facility] [place]  Subjective  - Doing better.  Sitting up in chair, complains of HA has history of migraines and states this feels like a migraine.  K+ low given 40 PO and sent new lab for K+ check.   Objective 106/42 82 98.4 F (36.9 C) (Oral) 17 95%  Intake/Output Summary (Last 24 hours) at 02/07/15 0711 Last data filed at 02/07/15 0700  Gross per 24 hour  Intake 1287.35 ml  Output   1900 ml  Net -612.65 ml    Grip 5/5 equal Incision clean and dry without hematoma Right tongue deviation, smile symmetric Speech clear BP 106/42 off dopamine for 2 hours Heart RRR Lungs non labored breathing  Assessment/Planning: POD # 1 right CEA  Will order her Imitrex for migraines Pending K+ after 40 mg oral dose, she takes 595 mg Potassium gluconate at home and she takes Triamterene-HCTZ At this time we will have given 120 meq of potassium and will redraw Bmet. Will advance to clears  Transfer to 2W   Clinton Gallant [facility] 02/07/2015 7:11 AM -- Hypokalemia replete Marginal mandibular and hypoglossal neuropraxia but improved over last 24 hours Will transfer to 2W  Hopefully d/c am  Hold off on imitrex for now to avoid cerebral vasoconstrictor in face of recent CEA  Fabienne Bruns, MD [facility] [place] Office: [phone] Pager: [phone]  Laboratory Lab Results:  Recent Labs  02/07/15 0330  WBC 7.9  HGB 9.6*  HCT 29.3*  PLT 278   BMET  Recent Labs  02/07/15 0330  NA 137  K 2.3*  CL 101  CO2 27  GLUCOSE 124*  BUN <5*  CREATININE 0.55  CALCIUM 7.4*    COAG Lab Results  Component Value Date   INR 1.01 02/01/2015   INR 0.96 01/04/2015   No results found for: PTT

## 2015-02-07 NOTE — Progress Notes (Signed)
Utilization Review Completed.  

## 2015-02-08 LAB — BASIC METABOLIC PANEL
Anion gap: 6 (ref 5–15)
CO2: 29 mmol/L (ref 22–32)
CREATININE: 0.58 mg/dL (ref 0.44–1.00)
Calcium: 8.1 mg/dL — ABNORMAL LOW (ref 8.9–10.3)
Chloride: 104 mmol/L (ref 101–111)
GFR calc Af Amer: >60 mL/min (ref >60)
Glucose, Bld: 101 mg/dL — ABNORMAL HIGH (ref 65–99)
Potassium: 3.9 mmol/L (ref 3.5–5.1)
Sodium: 139 mmol/L (ref 135–145)

## 2015-02-08 NOTE — Progress Notes (Signed)
S/L and monitor removed.  Discharge information reviewed with the patient and her husband.  Prescription for Ultram also given to the patient.  Patient will be transported to the door via W/C.

## 2015-02-08 NOTE — Progress Notes (Signed)
   VASCULAR SURGERY ASSESSMENT & PLAN:  * 2 Days Post-Op s/p: Right carotid endarterectomy  *   HYPOKALEMIA: She received more by mouth potassium last night and again this morning. Follow up potassium pending.  * Discharged this a.m.  SUBJECTIVE: No complaints.  PHYSICAL EXAM: Filed Vitals:   02/07/15 1500 02/07/15 1524 02/07/15 2019 02/08/15 0503  BP: 112/54 106/55 107/57 129/63  Pulse: 88  85 96  Temp:  98.3 F (36.8 C) 97.5 F (36.4 C) 98.2 F (36.8 C)  TempSrc:  Oral Oral Oral  Resp: 13 16 18 18   Height:      Weight:      SpO2: 98% 99% 97% 93%   Still some hoarseness. No focal weakness or paresthesias. Right neck incision looks fine.  LABS: Lab Results  Component Value Date   WBC 7.9 02/07/2015   HGB 9.6* 02/07/2015   HCT 29.3* 02/07/2015   MCV 89.3 02/07/2015   PLT 278 02/07/2015   Lab Results  Component Value Date   CREATININE 0.56 02/07/2015   Lab Results  Component Value Date   INR 1.01 02/01/2015   Active Problems:   Carotid artery stenosis  04/03/2015 Beeper: Cari Caraway 02/08/2015
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
## 2015-02-14 ENCOUNTER — Encounter: Payer: Self-pay | Admitting: Family Medicine

## 2015-02-14 ENCOUNTER — Ambulatory Visit (INDEPENDENT_AMBULATORY_CARE_PROVIDER_SITE_OTHER): Payer: Medicare Other | Admitting: Family Medicine

## 2015-02-14 ENCOUNTER — Encounter: Payer: Self-pay | Admitting: Internal Medicine

## 2015-02-14 ENCOUNTER — Telehealth: Payer: Self-pay | Admitting: Internal Medicine

## 2015-02-14 VITALS — BP 140/69 | HR 92 | Temp 97.9°F | Ht 60.0 in | Wt 163.0 lb

## 2015-02-14 DIAGNOSIS — I6521 Occlusion and stenosis of right carotid artery: Secondary | ICD-10-CM

## 2015-02-14 DIAGNOSIS — I5032 Chronic diastolic (congestive) heart failure: Secondary | ICD-10-CM | POA: Diagnosis not present

## 2015-02-14 DIAGNOSIS — M7989 Other specified soft tissue disorders: Secondary | ICD-10-CM | POA: Diagnosis not present

## 2015-02-14 DIAGNOSIS — E876 Hypokalemia: Secondary | ICD-10-CM

## 2015-02-14 DIAGNOSIS — N3000 Acute cystitis without hematuria: Secondary | ICD-10-CM | POA: Diagnosis not present

## 2015-02-14 DIAGNOSIS — I10 Essential (primary) hypertension: Secondary | ICD-10-CM

## 2015-02-14 DIAGNOSIS — R300 Dysuria: Secondary | ICD-10-CM

## 2015-02-14 DIAGNOSIS — I200 Unstable angina: Secondary | ICD-10-CM | POA: Diagnosis not present

## 2015-02-14 LAB — BASIC METABOLIC PANEL
BUN: 10 mg/dL (ref 6–23)
CHLORIDE: 96 meq/L (ref 96–112)
CO2: 29 meq/L (ref 19–32)
CREATININE: 0.66 mg/dL (ref 0.50–1.10)
Calcium: 9.4 mg/dL (ref 8.4–10.5)
GLUCOSE: 82 mg/dL (ref 70–99)
Potassium: 4.2 mEq/L (ref 3.5–5.3)
Sodium: 138 mEq/L (ref 135–145)

## 2015-02-14 LAB — POCT URINALYSIS DIPSTICK
Bilirubin, UA: NEGATIVE
Glucose, UA: NEGATIVE
Ketones, UA: NEGATIVE
Nitrite, UA: NEGATIVE
PH UA: 7.5
Protein, UA: NEGATIVE
Spec Grav, UA: 1.015
Urobilinogen, UA: 0.2

## 2015-02-14 LAB — POCT UA - MICROSCOPIC ONLY

## 2015-02-14 MED ORDER — CIPROFLOXACIN HCL 500 MG PO TABS: 500.0000 mg | ORAL_TABLET | Freq: Two times a day (BID) | ORAL | Status: DC

## 2015-02-14 NOTE — Patient Instructions (Signed)
It was nice to see you today.  Dr. Randolm Idol will call you with your lab results.  You have a UTI, Cipro has been sent to your pharmacy, take twice daily for 7 days  Ice the area under your ear  Continue Dyazide as this is helping with your blood pressure and swollen legs.

## 2015-02-15 DIAGNOSIS — I5032 Chronic diastolic (congestive) heart failure: Secondary | ICD-10-CM | POA: Insufficient documentation

## 2015-02-15 NOTE — Progress Notes (Signed)
   Subjective:    Patient ID: Theresa Brennan, female    DOB: November 06, 1945, 69 y.o.   MRN: [id number]  HPI 69 y/o female presents for routine follow up.   Right CEA - patient had right sided Carotid Endarterectomy on 02/06/15. Procedure went well except for some mild tongue deviation and facial droop. These symptoms are gradually resolving. She does report some swelling in the area. No erythema or drainage around the incision site. No neurologic or vision changes. Does reports some pain under the right ear.   Hypokalemia - patient had hypokalemia during hospitalization, resolved before discharge, patient reports taking otc potassium pills (potassium gluconate documented in medication history).  Dysuria - patient has dysuria for the past few day, similar to previous UTI, no abdominal pain, no vaginal discharge, no nausea/emesis/diarrhea  HTN - Dyazide restarted at last visit per patient request, no side effects, taking regularly, reports improved leg swelling, no chest pain, no headaches  Leg swelling - improved on Diazide, recent Echo showed diastolic dysfunction with normal systolic function  Social - lives with husband, former smoker   Review of Systems  Constitutional: Negative for fever, chills and fatigue.  Respiratory: Negative for chest tightness and shortness of breath.   Cardiovascular: Positive for leg swelling. Negative for chest pain.  Gastrointestinal: Negative for nausea, vomiting and diarrhea.  Genitourinary: Positive for dysuria.  Neurological: Negative for numbness and headaches.       Objective:   Physical Exam Vitals: reviewed Gen: pleasant female, NAD HEENT: right CEA scar healing well, no erythema or drainage, swelling and tenderness under right ear, small amount of blood in right EAC, TM intact on the right Cardiac: RRR, S1 and S2 present, no murmur, no heaves/thrills Resp: CTAB, normal effort Abd: soft, no tenderness, normal bowel sounds Ext: trace edema Neuro:  no facial droop, CN2-12 intact  Urine dipstick - reviewed and consistent with UTI  Echo 01/24/15 - Ef 55-60%, grade 2 diastolic dysfuction     Assessment & Plan:  Please see problem specific assessment and plan.

## 2015-02-15 NOTE — Assessment & Plan Note (Signed)
Patient underwent right CEA on 02/06/15. Had some mild tongue deviation and facial droop that are resolving. Incision site healing well. -encouraged ice to the area to decrease swelling -routine follow up with vascular surgery

## 2015-02-15 NOTE — Assessment & Plan Note (Signed)
Multifactorial from venous insufficiency, diastolic heart failure, and hypoalbuminemia. -controlled with diazide

## 2015-02-15 NOTE — Assessment & Plan Note (Signed)
Patient was hypokalemic during hospitalization for CEA. -check BMP today

## 2015-02-15 NOTE — Assessment & Plan Note (Signed)
Stable on Dyazide -no changes in therapy today

## 2015-02-15 NOTE — Assessment & Plan Note (Signed)
Patient has symptoms and urine dipstick consistent with UTI. -started on Cipro -follow urine culture

## 2015-02-15 NOTE — Telephone Encounter (Signed)
Close encounter 

## 2015-02-16 ENCOUNTER — Encounter: Payer: Self-pay | Admitting: Family

## 2015-02-16 ENCOUNTER — Telehealth: Payer: Self-pay

## 2015-02-16 ENCOUNTER — Ambulatory Visit (INDEPENDENT_AMBULATORY_CARE_PROVIDER_SITE_OTHER): Payer: Self-pay | Admitting: Family

## 2015-02-16 ENCOUNTER — Inpatient Hospital Stay (HOSPITAL_COMMUNITY)
Admission: AD | Admit: 2015-02-16 | Discharge: 2015-02-19 | DRG: 921 | Disposition: A | Discharge status: Home / Self Care | Payer: Medicare Other | Source: Ambulatory Visit | Attending: Vascular Surgery | Admitting: Vascular Surgery

## 2015-02-16 VITALS — BP 139/83 | HR 81 | Ht 60.0 in | Wt 162.8 lb

## 2015-02-16 DIAGNOSIS — H6501 Acute serous otitis media, right ear: Secondary | ICD-10-CM | POA: Diagnosis not present

## 2015-02-16 DIAGNOSIS — H6061 Unspecified chronic otitis externa, right ear: Secondary | ICD-10-CM | POA: Diagnosis not present

## 2015-02-16 DIAGNOSIS — N3010 Interstitial cystitis (chronic) without hematuria: Secondary | ICD-10-CM | POA: Diagnosis present

## 2015-02-16 DIAGNOSIS — J322 Chronic ethmoidal sinusitis: Secondary | ICD-10-CM | POA: Diagnosis not present

## 2015-02-16 DIAGNOSIS — Z87891 Personal history of nicotine dependence: Secondary | ICD-10-CM

## 2015-02-16 DIAGNOSIS — R519 Headache, unspecified: Secondary | ICD-10-CM

## 2015-02-16 DIAGNOSIS — R51 Headache: Secondary | ICD-10-CM

## 2015-02-16 DIAGNOSIS — T8189XA Other complications of procedures, not elsewhere classified, initial encounter: Secondary | ICD-10-CM | POA: Diagnosis present

## 2015-02-16 DIAGNOSIS — R1310 Dysphagia, unspecified: Secondary | ICD-10-CM

## 2015-02-16 DIAGNOSIS — G43909 Migraine, unspecified, not intractable, without status migrainosus: Secondary | ICD-10-CM | POA: Diagnosis present

## 2015-02-16 DIAGNOSIS — Z7982 Long term (current) use of aspirin: Secondary | ICD-10-CM

## 2015-02-16 DIAGNOSIS — R490 Dysphonia: Secondary | ICD-10-CM

## 2015-02-16 DIAGNOSIS — J320 Chronic maxillary sinusitis: Secondary | ICD-10-CM | POA: Diagnosis not present

## 2015-02-16 DIAGNOSIS — E039 Hypothyroidism, unspecified: Secondary | ICD-10-CM | POA: Diagnosis present

## 2015-02-16 DIAGNOSIS — Z9889 Other specified postprocedural states: Secondary | ICD-10-CM

## 2015-02-16 DIAGNOSIS — H6121 Impacted cerumen, right ear: Secondary | ICD-10-CM | POA: Diagnosis not present

## 2015-02-16 DIAGNOSIS — N189 Chronic kidney disease, unspecified: Secondary | ICD-10-CM | POA: Diagnosis present

## 2015-02-16 DIAGNOSIS — H7291 Unspecified perforation of tympanic membrane, right ear: Secondary | ICD-10-CM | POA: Diagnosis not present

## 2015-02-16 DIAGNOSIS — J040 Acute laryngitis: Secondary | ICD-10-CM | POA: Diagnosis not present

## 2015-02-16 HISTORY — DX: Urinary tract infection, site not specified: N39.0

## 2015-02-16 HISTORY — DX: Low back pain: M54.5

## 2015-02-16 HISTORY — DX: Personal history of peptic ulcer disease: Z87.11

## 2015-02-16 HISTORY — DX: Other chronic pain: G89.29

## 2015-02-16 HISTORY — DX: Personal history of other medical treatment: Z92.89

## 2015-02-16 HISTORY — DX: Low back pain, unspecified: M54.50

## 2015-02-16 HISTORY — DX: Personal history of other diseases of the digestive system: Z87.19

## 2015-02-16 HISTORY — DX: Hyperlipidemia, unspecified: E78.5

## 2015-02-16 HISTORY — DX: Migraine, unspecified, not intractable, without status migrainosus: G43.909

## 2015-02-16 HISTORY — DX: Unspecified chronic bronchitis: J42

## 2015-02-16 LAB — URINALYSIS, ROUTINE W REFLEX MICROSCOPIC
Bilirubin Urine: NEGATIVE
Glucose, UA: NEGATIVE mg/dL
Hgb urine dipstick: NEGATIVE
Ketones, ur: NEGATIVE mg/dL
LEUKOCYTES UA: NEGATIVE
Nitrite: NEGATIVE
PH: 6.5 (ref 5.0–8.0)
PROTEIN: NEGATIVE mg/dL
Specific Gravity, Urine: 1.01 (ref 1.005–1.030)
Urobilinogen, UA: 0.2 mg/dL (ref 0.0–1.0)

## 2015-02-16 LAB — CBC
HCT: 36.7 % (ref 36.0–46.0)
HEMOGLOBIN: 12 g/dL (ref 12.0–15.0)
MCH: 29 pg (ref 26.0–34.0)
MCHC: 32.7 g/dL (ref 30.0–36.0)
MCV: 88.6 fL (ref 78.0–100.0)
PLATELETS: 453 10*3/uL — AB (ref 150–400)
RBC: 4.14 MIL/uL (ref 3.87–5.11)
RDW: 13.1 % (ref 11.5–15.5)
WBC: 8.3 10*3/uL (ref 4.0–10.5)

## 2015-02-16 LAB — COMPREHENSIVE METABOLIC PANEL
ALK PHOS: 85 U/L (ref 38–126)
ALT: 18 U/L (ref 14–54)
ANION GAP: 10 (ref 5–15)
AST: 25 U/L (ref 15–41)
Albumin: 3.5 g/dL (ref 3.5–5.0)
BILIRUBIN TOTAL: 0.6 mg/dL (ref 0.3–1.2)
BUN: 9 mg/dL (ref 6–20)
CO2: 29 mmol/L (ref 22–32)
Calcium: 9.2 mg/dL (ref 8.9–10.3)
Chloride: 97 mmol/L — ABNORMAL LOW (ref 101–111)
Creatinine, Ser: 0.74 mg/dL (ref 0.44–1.00)
GFR calc Af Amer: >60 mL/min (ref >60)
Glucose, Bld: 84 mg/dL (ref 65–99)
Potassium: 3 mmol/L — ABNORMAL LOW (ref 3.5–5.1)
Sodium: 136 mmol/L (ref 135–145)
Total Protein: 7.4 g/dL (ref 6.5–8.1)

## 2015-02-16 LAB — PROTIME-INR
INR: 1.07 (ref 0.00–1.49)
Prothrombin Time: 14.1 seconds (ref 11.6–15.2)

## 2015-02-16 LAB — URINE CULTURE: Colony Count: >=100000

## 2015-02-16 MED ORDER — CIPROFLOXACIN HCL 500 MG PO TABS
500.0000 mg | ORAL_TABLET | Freq: Two times a day (BID) | ORAL | Status: DC
  Filled 2015-02-16: qty 1

## 2015-02-16 MED ORDER — LABETALOL HCL 5 MG/ML IV SOLN
10.0000 mg | INTRAVENOUS | Status: DC | PRN
  Filled 2015-02-16: qty 4

## 2015-02-16 MED ORDER — ALUM & MAG HYDROXIDE-SIMETH 200-200-20 MG/5ML PO SUSP: 15.0000 mL | ORAL | Status: DC | PRN

## 2015-02-16 MED ORDER — ONDANSETRON HCL 4 MG/2ML IJ SOLN
4.0000 mg | Freq: Four times a day (QID) | INTRAMUSCULAR | Status: DC | PRN
  Administered 2015-02-16 – 2015-02-17 (×3): 4 mg via INTRAVENOUS
  Filled 2015-02-16 (×3): qty 2

## 2015-02-16 MED ORDER — CYCLOBENZAPRINE HCL 5 MG PO TABS
5.0000 mg | ORAL_TABLET | Freq: Every day | ORAL | Status: DC
  Administered 2015-02-17 – 2015-02-18 (×2): 5 mg via ORAL
  Filled 2015-02-16 (×4): qty 1

## 2015-02-16 MED ORDER — POTASSIUM CHLORIDE 10 MEQ/100ML IV SOLN
10.0000 meq | INTRAVENOUS | Status: AC
  Administered 2015-02-16 – 2015-02-17 (×4): 10 meq via INTRAVENOUS
  Filled 2015-02-16 (×4): qty 100

## 2015-02-16 MED ORDER — OXYMETAZOLINE HCL 0.05 % NA SOLN
1.0000 | Freq: Two times a day (BID) | NASAL | Status: DC | PRN
  Filled 2015-02-16: qty 15

## 2015-02-16 MED ORDER — PANTOPRAZOLE SODIUM 40 MG IV SOLR
40.0000 mg | Freq: Every day | INTRAVENOUS | Status: DC
  Administered 2015-02-16 – 2015-02-17 (×2): 40 mg via INTRAVENOUS
  Filled 2015-02-16 (×4): qty 40

## 2015-02-16 MED ORDER — PHENOL 1.4 % MT LIQD
1.0000 | OROMUCOSAL | Status: DC | PRN
  Filled 2015-02-16: qty 177

## 2015-02-16 MED ORDER — BISACODYL 10 MG RE SUPP: 10.0000 mg | Freq: Every day | RECTAL | Status: DC | PRN

## 2015-02-16 MED ORDER — HYDRALAZINE HCL 20 MG/ML IJ SOLN: 5.0000 mg | INTRAMUSCULAR | Status: DC | PRN

## 2015-02-16 MED ORDER — HYDROMORPHONE HCL 1 MG/ML IJ SOLN
0.5000 mg | INTRAMUSCULAR | Status: DC | PRN
  Administered 2015-02-16 (×2): 0.5 mg via INTRAVENOUS
  Filled 2015-02-16 (×2): qty 1

## 2015-02-16 MED ORDER — LEVOTHYROXINE SODIUM 50 MCG PO TABS
50.0000 ug | ORAL_TABLET | Freq: Every day | ORAL | Status: DC
  Filled 2015-02-16 (×2): qty 1

## 2015-02-16 MED ORDER — PANTOPRAZOLE SODIUM 40 MG PO TBEC
80.0000 mg | DELAYED_RELEASE_TABLET | Freq: Every day | ORAL | Status: DC
Start: 2015-02-16 — End: 2015-02-16

## 2015-02-16 MED ORDER — TRAMADOL HCL 50 MG PO TABS
50.0000 mg | ORAL_TABLET | ORAL | Status: DC | PRN
  Filled 2015-02-16: qty 1

## 2015-02-16 MED ORDER — GUAIFENESIN-DM 100-10 MG/5ML PO SYRP: 15.0000 mL | ORAL_SOLUTION | ORAL | Status: DC | PRN

## 2015-02-16 MED ORDER — FERROUS SULFATE 325 (65 FE) MG PO TABS
325.0000 mg | ORAL_TABLET | Freq: Every day | ORAL | Status: DC
  Administered 2015-02-18 – 2015-02-19 (×2): 325 mg via ORAL
  Filled 2015-02-16 (×4): qty 1

## 2015-02-16 MED ORDER — CIPROFLOXACIN IN D5W 400 MG/200ML IV SOLN
400.0000 mg | Freq: Once | INTRAVENOUS | Status: AC
  Administered 2015-02-17: 400 mg via INTRAVENOUS
  Filled 2015-02-16: qty 200

## 2015-02-16 MED ORDER — POTASSIUM CHLORIDE CRYS ER 20 MEQ PO TBCR: 20.0000 meq | EXTENDED_RELEASE_TABLET | Freq: Once | ORAL | Status: DC

## 2015-02-16 MED ORDER — TRIAMTERENE-HCTZ 37.5-25 MG PO CAPS
1.0000 | ORAL_CAPSULE | Freq: Every day | ORAL | Status: DC
  Administered 2015-02-17 – 2015-02-18 (×2): 1 via ORAL
  Filled 2015-02-16 (×4): qty 1

## 2015-02-16 MED ORDER — METOPROLOL TARTRATE 1 MG/ML IV SOLN
2.0000 mg | INTRAVENOUS | Status: DC | PRN
Start: 2015-02-16 — End: 2015-02-19

## 2015-02-16 MED ORDER — ENOXAPARIN SODIUM 40 MG/0.4ML ~~LOC~~ SOLN
40.0000 mg | SUBCUTANEOUS | Status: DC
  Administered 2015-02-17 – 2015-02-18 (×2): 40 mg via SUBCUTANEOUS
  Filled 2015-02-16 (×4): qty 0.4

## 2015-02-16 MED ORDER — HYDROMORPHONE HCL 1 MG/ML IJ SOLN
0.5000 mg | INTRAMUSCULAR | Status: DC | PRN
  Administered 2015-02-17 (×2): 0.5 mg via INTRAVENOUS
  Administered 2015-02-17: 1 mg via INTRAVENOUS
  Filled 2015-02-16 (×3): qty 1

## 2015-02-16 MED ORDER — ASPIRIN EC 325 MG PO TBEC
325.0000 mg | DELAYED_RELEASE_TABLET | Freq: Every day | ORAL | Status: DC
  Administered 2015-02-18 – 2015-02-19 (×2): 325 mg via ORAL
  Filled 2015-02-16 (×3): qty 1

## 2015-02-16 MED ORDER — ATORVASTATIN CALCIUM 40 MG PO TABS
40.0000 mg | ORAL_TABLET | Freq: Every day | ORAL | Status: DC
Start: 2015-02-17 — End: 2015-02-19
  Administered 2015-02-17 – 2015-02-18 (×2): 40 mg via ORAL
  Filled 2015-02-16 (×3): qty 1

## 2015-02-16 MED ORDER — SODIUM CHLORIDE 0.9 % IV SOLN
INTRAVENOUS | Status: DC
  Administered 2015-02-16 – 2015-02-17 (×2): via INTRAVENOUS
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
## 2015-02-16 NOTE — Telephone Encounter (Signed)
Phone call from pt.  Reported she has right ear pain; stated this has gotten worse since she got home from hospital.  Reported the right side of her head hurts, and describes that it starts in the morning, and progressively worsens throughout day. Stated she kept thinking this would get better.  Reported she has hx of Migraines, and has tried Imitrex, but this hasn't helped the headache.  C/o the right side of throat continuing to be "very sore"; also stated the back, right gum is sore when she wears her dentures or chews.  Reported she has some difficulty swallowing saliva at night.  Stated she is able to eat small bites of soft foods; stated it takes longer to swallow.  Stated "it feels like my throat is closing up, but I know it's not."  Reported "I'm not talking as plain as I was."  Denied visual disturbance, or unilateral weakness.  Reported she has balance problems, and tends to lean toward the left, but stated this has been present for a long time, and was present, prior to the surgery.  Reported she had tongue deviation following the surgery.  Reported saw PCP 6/21, and BP was 140/69.  Reported is scheduled to see her ENT MD today, Dr. Hermelinda Medicus, due to ongoing problems with her right ear; stated her "middle ear drum deteriorated", and she has had several surgeries on right ear.  Advised to keep appt. with ENT today; if Dr. Haroldine Laws wants to discuss pt. with MD on call, he can call our office.  Pt. agrees with plan.
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
## 2015-02-16 NOTE — Patient Instructions (Signed)
Stroke Prevention Some medical conditions and behaviors are associated with an increased chance of having a stroke. You may prevent a stroke by making healthy choices and managing medical conditions. HOW CAN I REDUCE MY RISK OF HAVING A STROKE?   Stay physically active. Get at least 30 minutes of activity on most or all days.  Do not smoke. It may also be helpful to avoid exposure to secondhand smoke.  Limit alcohol use. Moderate alcohol use is considered to be:  No more than 2 drinks per day for men.  No more than 1 drink per day for nonpregnant women.  Eat healthy foods. This involves:  Eating 5 or more servings of fruits and vegetables a day.  Making dietary changes that address high blood pressure (hypertension), high cholesterol, diabetes, or obesity.  Manage your cholesterol levels.  Making food choices that are high in fiber and low in saturated fat, trans fat, and cholesterol may control cholesterol levels.  Take any prescribed medicines to control cholesterol as directed by your health care provider.  Manage your diabetes.  Controlling your carbohydrate and sugar intake is recommended to manage diabetes.  Take any prescribed medicines to control diabetes as directed by your health care provider.  Control your hypertension.  Making food choices that are low in salt (sodium), saturated fat, trans fat, and cholesterol is recommended to manage hypertension.  Take any prescribed medicines to control hypertension as directed by your health care provider.  Maintain a healthy weight.  Reducing calorie intake and making food choices that are low in sodium, saturated fat, trans fat, and cholesterol are recommended to manage weight.  Stop drug abuse.  Avoid taking birth control pills.  Talk to your health care provider about the risks of taking birth control pills if you are over 35 years old, smoke, get migraines, or have ever had a blood clot.  Get evaluated for sleep  disorders (sleep apnea).  Talk to your health care provider about getting a sleep evaluation if you snore a lot or have excessive sleepiness.  Take medicines only as directed by your health care provider.  For some people, aspirin or blood thinners (anticoagulants) are helpful in reducing the risk of forming abnormal blood clots that can lead to stroke. If you have the irregular heart rhythm of atrial fibrillation, you should be on a blood thinner unless there is a good reason you cannot take them.  Understand all your medicine instructions.  Make sure that other conditions (such as anemia or atherosclerosis) are addressed. SEEK IMMEDIATE MEDICAL CARE IF:   You have sudden weakness or numbness of the face, arm, or leg, especially on one side of the body.  Your face or eyelid droops to one side.  You have sudden confusion.  You have trouble speaking (aphasia) or understanding.  You have sudden trouble seeing in one or both eyes.  You have sudden trouble walking.  You have dizziness.  You have a loss of balance or coordination.  You have a sudden, severe headache with no known cause.  You have new chest pain or an irregular heartbeat. Any of these symptoms may represent a serious problem that is an emergency. Do not wait to see if the symptoms will go away. Get medical help at once. Call your local emergency services (911 in [place] Do not drive yourself to the hospital. Document Released: 09/19/2004 Document Revised: 12/27/2013 Document Reviewed: 02/12/2013 [employer] Patient Information 2015 [employer]. This information is not intended to replace advice given   to you by your health care provider. Make sure you discuss any questions you have with your health care provider.  

## 2015-02-17 ENCOUNTER — Encounter (HOSPITAL_COMMUNITY): Payer: Self-pay | Admitting: General Practice

## 2015-02-17 ENCOUNTER — Telehealth: Payer: Self-pay | Admitting: Family Medicine

## 2015-02-17 MED ORDER — ENSURE ENLIVE PO LIQD
237.0000 mL | Freq: Two times a day (BID) | ORAL | Status: DC
  Administered 2015-02-17: 237 mL via ORAL

## 2015-02-17 MED ORDER — CIPROFLOXACIN IN D5W 400 MG/200ML IV SOLN
400.0000 mg | Freq: Two times a day (BID) | INTRAVENOUS | Status: DC
  Administered 2015-02-17 – 2015-02-18 (×2): 400 mg via INTRAVENOUS
  Filled 2015-02-17 (×3): qty 200

## 2015-02-17 MED ORDER — SUMATRIPTAN SUCCINATE 100 MG PO TABS
100.0000 mg | ORAL_TABLET | Freq: Two times a day (BID) | ORAL | Status: DC | PRN
  Administered 2015-02-17 – 2015-02-18 (×3): 100 mg via ORAL
  Filled 2015-02-17 (×4): qty 1

## 2015-02-17 MED ORDER — ADULT MULTIVITAMIN W/MINERALS CH
1.0000 | ORAL_TABLET | Freq: Every day | ORAL | Status: DC
  Administered 2015-02-18 – 2015-02-19 (×2): 1 via ORAL
  Filled 2015-02-17 (×2): qty 1

## 2015-02-17 MED ORDER — LEVOTHYROXINE SODIUM 50 MCG PO TABS
50.0000 ug | ORAL_TABLET | Freq: Every day | ORAL | Status: DC
  Administered 2015-02-17 – 2015-02-19 (×3): 50 ug via ORAL
  Filled 2015-02-17 (×4): qty 1
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
## 2015-02-17 NOTE — Telephone Encounter (Signed)
Called patient to discuss normal BMP and Ecoli UTI sensitive to Cipro.

## 2015-02-17 NOTE — Progress Notes (Signed)
   VASCULAR SURGERY ASSESSMENT & PLAN:  * Imitrex or migraine this morning. Says worked for her in the past.  * Patient is scheduled for a swallowing evaluation today. We will make further recommendations for her diet pending this evaluation and also try to get her back on her medications.   SUBJECTIVE: Complains of a migraine this morning.  PHYSICAL EXAM: Filed Vitals:   02/16/15 1650 02/16/15 2000 02/17/15 0124 02/17/15 0624  BP: 151/71 138/60 126/44 110/48  Pulse: 84 83 76 80  Temp: 97.8 F (36.6 C) 98.1 F (36.7 C) 98.3 F (36.8 C) 97.7 F (36.5 C)  TempSrc: Oral Oral Oral Oral  Resp: 18 18 18 18   Height: 5' (1.524 m)     Weight: 161 lb 8 oz (73.256 kg)   163 lb (73.936 kg)  SpO2: 100% 99% 98% 96%   NEURO: Neuro intact. No focal weakness or paresthesias. Neck incision looks fine.  LABS: Lab Results  Component Value Date   WBC 8.3 02/16/2015   HGB 12.0 02/16/2015   HCT 36.7 02/16/2015   MCV 88.6 02/16/2015   PLT 453* 02/16/2015   Lab Results  Component Value Date   CREATININE 0.74 02/16/2015   Lab Results  Component Value Date   INR 1.07 02/16/2015   Active Problems:   Headache  02/18/2015 Beeper: Cari Caraway 02/17/2015

## 2015-02-17 NOTE — Progress Notes (Signed)
Initial Nutrition Assessment  DOCUMENTATION CODES:  Obesity unspecified  INTERVENTION:  Magic cup, MVI  NUTRITION DIAGNOSIS:  Inadequate oral intake related to dysphagia as evidenced by per patient/family report.   GOAL:  Patient will meet greater than or equal to 90% of their needs   MONITOR:  PO intake, Supplement acceptance, Diet advancement, Weight trends, Labs  REASON FOR ASSESSMENT:  Malnutrition Screening Tool    ASSESSMENT:  Pt states that due to swallowing difficulty she has been eating about 25% compared to her usual food intake and has lost 5 lbs in the past 2 weeks. She reports consuming liquids and soft/pureed foods PTA. Assessed by SLP today and has been placed on a Dysphagia 1 diet with thin liquids. Pt reports ongoing headaches and nausea at time of visit. Weight history shows 1-2 lbs wt loss in the past couple weeks; no evidence of wasting per physical exam.  RD encouraged PO intake and recommended consuming Magic cup supplements if meal completion is poor. She does not like Ensure supplements.  RD will continue to monitor for PO adequacy.  Labs: Low potassium, low chloride  Height:  Ht Readings from Last 1 Encounters:  02/16/15 5' (1.524 m)    Weight:  Wt Readings from Last 1 Encounters:  02/17/15 163 lb (73.936 kg)    Ideal Body Weight:  45.5 kg  Wt Readings from Last 10 Encounters:  02/17/15 163 lb (73.936 kg)  02/16/15 162 lb 12.8 oz (73.846 kg)  02/14/15 163 lb (73.936 kg)  02/06/15 164 lb (74.39 kg)  02/01/15 164 lb 4.8 oz (74.526 kg)  01/26/15 165 lb 14.4 oz (75.252 kg)  01/17/15 164 lb (74.39 kg)  01/10/15 165 lb (74.844 kg)  01/04/15 162 lb 11.2 oz (73.8 kg)  12/05/14 161 lb (73.029 kg)    BMI:  Body mass index is 31.83 kg/(m^2).  Estimated Nutritional Needs:  Kcal:  1650-1850  Protein:  80-90 grams  Fluid:  1.7-1.9 L/day  Skin:  Wound (see comment) (closed incision on right neck)  Diet Order:  DIET - DYS 1 Room  service appropriate?: Yes; Fluid consistency:: Thin  EDUCATION NEEDS:  No education needs identified at this time   Intake/Output Summary (Last 24 hours) at 02/17/15 1503 Last data filed at 02/17/15 1321  Gross per 24 hour  Intake 1222.5 ml  Output    700 ml  Net  522.5 ml    Last BM:  6/22  Ian Malkin RD, LDN Inpatient Clinical Dietitian Pager: [phone] After Hours Pager: [phone]

## 2015-02-17 NOTE — Progress Notes (Signed)
   VASCULAR SURGERY:  Addendum: Greatly appreciate speech pathology's assistance. She is now on a dysphagia one diet with thin liquids. She can receive her medications crushed with pure.  Home when tolerating diet and headaches are controlled. She has a history of migraine headaches and these seem to be relieved with Imitrex. If this became a more significant issue she would required neurology evaluation.  Waverly Ferrari, MD, FACS Beeper [phone] Office: [phone]

## 2015-02-17 NOTE — Progress Notes (Signed)
Pt refused speech evaluation at this time due to migraine headache being so bad. I just gave her pain medication for headache. Pt willing to try evaluation later. Speech will come back later.

## 2015-02-18 LAB — CBC
HCT: 36 % (ref 36.0–46.0)
HEMOGLOBIN: 11.5 g/dL — AB (ref 12.0–15.0)
MCH: 28.8 pg (ref 26.0–34.0)
MCHC: 31.9 g/dL (ref 30.0–36.0)
MCV: 90.2 fL (ref 78.0–100.0)
Platelets: 444 10*3/uL — ABNORMAL HIGH (ref 150–400)
RBC: 3.99 MIL/uL (ref 3.87–5.11)
RDW: 13.3 % (ref 11.5–15.5)
WBC: 7.5 10*3/uL (ref 4.0–10.5)

## 2015-02-18 MED ORDER — CIPROFLOXACIN HCL 500 MG PO TABS
500.0000 mg | ORAL_TABLET | Freq: Two times a day (BID) | ORAL | Status: DC
  Administered 2015-02-18 – 2015-02-19 (×3): 500 mg via ORAL
  Filled 2015-02-18 (×4): qty 1

## 2015-02-18 NOTE — Progress Notes (Signed)
   Daily Progress Note  Pt reported without IV access since yesterday.  Pt not interested in further IV placement attempts.  If pt tolerates dysphagia diet overnight, will D/C in AM as continued hospitalization no longer needed as IV medication can't be given anyway.   Leonides Sake, MD [facility] [place] Office: [phone] Pager: [phone]  02/18/2015, 4:32 PM

## 2015-02-18 NOTE — Progress Notes (Addendum)
   Daily Progress Note  Assessment/Planning: S/p R CEA complicated with mild dysphagia, migraine H/A   HA better but not fully gone  Still some difficulty swallowing  Wants to stay in hospital  Continue abx for UTI  D/C once sx better  Subjective    C/o mild residual headache and continue difficulty with swallowing  Objective Filed Vitals:   02/17/15 1351 02/17/15 2132 02/18/15 0158 02/18/15 0511  BP: 138/60 113/53 132/52 115/58  Pulse: 81 77 78 83  Temp: 97.3 F (36.3 C) 98.2 F (36.8 C) 97.8 F (36.6 C) 97.9 F (36.6 C)  TempSrc: Other (Comment) Oral Oral Oral  Resp: 20 18 18 18   Height:      Weight:    161 lb 11.2 oz (73.347 kg)  SpO2: 100% 99% 98% 97%    Intake/Output Summary (Last 24 hours) at 02/18/15 0810 Last data filed at 02/18/15 02/20/15  Gross per 24 hour  Intake   1480 ml  Output   2300 ml  Net   -820 ml    PULM  CTAB CV  RRR GI  soft, NTND NECK  Inc c/d/i, no hematoma NEURO tongue midline, M/S 5/5 sym except R hand due to infilitrated IV site  Laboratory CBC    Component Value Date/Time   WBC 8.3 02/16/2015 1740   HGB 12.0 02/16/2015 1740   HCT 36.7 02/16/2015 1740   PLT 453* 02/16/2015 1740    BMET    Component Value Date/Time   NA 136 02/16/2015 1740   K 3.0* 02/16/2015 1740   CL 97* 02/16/2015 1740   CO2 29 02/16/2015 1740   GLUCOSE 84 02/16/2015 1740   BUN 9 02/16/2015 1740   CREATININE 0.74 02/16/2015 1740   CREATININE 0.66 02/14/2015 1151   CALCIUM 9.2 02/16/2015 1740   GFRNONAA >60 02/16/2015 1740   GFRAA >60 02/16/2015 1740    02/18/2015, MD [facility] [place] Office: [phone] Pager: [phone]  02/18/2015, 8:10 AM

## 2015-02-18 NOTE — Progress Notes (Signed)
Pt's peripheral IV infiltrated, swelling noted to pt's right hand, warm compress applied, right arm elevated with pillows. Will continue to monitor.

## 2015-02-19 MED ORDER — SUMATRIPTAN SUCCINATE 100 MG PO TABS: 100.0000 mg | ORAL_TABLET | Freq: Once | ORAL | Status: DC | PRN

## 2015-02-19 NOTE — Progress Notes (Signed)
Pt discharged home , instructions given to patient, Tele removed. Verbalized understanding of discharge questions answered. Taken out via wheelchair by NT.

## 2015-02-19 NOTE — Progress Notes (Addendum)
   Daily Progress Note  Assessment/Planning: S/p R CEA complicated with mild dysphagia, migraine H/A   PO intake tolerated  HA gone  Ok to D/C  Follow up with Dr. Darrick Penna this coming Thursday  Subjective    Able to tolerate food, HA gone  Objective Filed Vitals:   02/18/15 0511 02/18/15 1400 02/18/15 2206 02/19/15 0702  BP: 115/58 140/57 135/73 127/59  Pulse: 83 88 85 76  Temp: 97.9 F (36.6 C) 97.6 F (36.4 C) 97.5 F (36.4 C) 97.6 F (36.4 C)  TempSrc: Oral Oral Oral Oral  Resp: 18 20 18 18   Height:      Weight: 161 lb 11.2 oz (73.347 kg)   161 lb 6 oz (73.2 kg)  SpO2: 97% 97% 99% 100%    Intake/Output Summary (Last 24 hours) at 02/19/15 0742 Last data filed at 02/19/15 0200  Gross per 24 hour  Intake   1080 ml  Output    400 ml  Net    680 ml    PULM  CTAB CV  RRR GI  soft, NTND NECK Inc c/d/i, no hematoma NEURO tongue midline, M/S 5/5 sym   Laboratory CBC    Component Value Date/Time   WBC 7.5 02/18/2015 1525   HGB 11.5* 02/18/2015 1525   HCT 36.0 02/18/2015 1525   PLT 444* 02/18/2015 1525    BMET    Component Value Date/Time   NA 136 02/16/2015 1740   K 3.0* 02/16/2015 1740   CL 97* 02/16/2015 1740   CO2 29 02/16/2015 1740   GLUCOSE 84 02/16/2015 1740   BUN 9 02/16/2015 1740   CREATININE 0.74 02/16/2015 1740   CREATININE 0.66 02/14/2015 1151   CALCIUM 9.2 02/16/2015 1740   GFRNONAA >60 02/16/2015 1740   GFRAA >60 02/16/2015 1740    02/18/2015, MD [facility] [place] Office: [phone] Pager: [phone]  02/19/2015, 7:42 AM
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
## 2015-02-24 ENCOUNTER — Encounter: Payer: Self-pay | Admitting: Vascular Surgery

## 2015-03-02 ENCOUNTER — Ambulatory Visit (INDEPENDENT_AMBULATORY_CARE_PROVIDER_SITE_OTHER): Payer: Self-pay | Admitting: Vascular Surgery

## 2015-03-02 ENCOUNTER — Encounter: Payer: Self-pay | Admitting: Vascular Surgery

## 2015-03-02 VITALS — BP 124/67 | HR 84 | Ht 60.0 in | Wt 163.5 lb

## 2015-03-02 DIAGNOSIS — I6521 Occlusion and stenosis of right carotid artery: Secondary | ICD-10-CM

## 2015-03-02 MED ORDER — ACETAMINOPHEN-CODEINE #3 300-30 MG PO TABS: 1.0000 | ORAL_TABLET | ORAL | Status: DC | PRN

## 2015-03-02 NOTE — Progress Notes (Signed)
Patient is a 69 year old female who underwent right carotid endarterectomy 24 days ago. She presents today for postoperative follow-up. He still has some hoarseness of her voice. She also still has some weakness of the right side of her tongue.  She recently saw her ear nose and throat doctor who also noted a right tympanic membrane perforation. On review of her operative note we did have to put some traction on her hypoglossal nerve and this most likely explains most of the symptoms that she is experiencing. She also has a history of migraine headaches. She states she has had more frequent headaches since the operation. She states however that these are not similar to her previous migraine headaches. I discussed with her today that I was not in favor of her taking Imitrex since it is a cerebral vasoconstrictor. She is able to drink without difficulty. She has some difficulty swallowing hard solid foods.  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 She will continue her aspirin daily. I did give her a prescription today for Tylenol 3 to see if this would help with her headaches. Hopefully these will also improve with time. It is difficult to know currently whether or not these represented true migraine versus tension headache versus reperfusion  The patient has a follow-up appointment  with her ear nose and throat doctor next Wednesday. It may be worthwhile for Dr. Haroldine Laws to do nasal endoscopy to evaluate her vocal cords at that time as well. However I feel confident that this should continue to recover with time alone. I discussed all of these findings with the patient today as well as the fact that she does have some cranial nerve injury that this is usually recovers over time.  Fabienne Bruns, MD [facility] [place] Office: [phone] Pager: [phone]

## 2015-03-08 DIAGNOSIS — R490 Dysphonia: Secondary | ICD-10-CM | POA: Diagnosis not present

## 2015-03-10 ENCOUNTER — Other Ambulatory Visit: Payer: Self-pay | Admitting: Family Medicine

## 2015-03-10 NOTE — Telephone Encounter (Signed)
Rx called in 

## 2015-03-10 NOTE — Telephone Encounter (Signed)
RN Staff - please call in Flexeril 10 mg PO daily prn muscle spasm, dispense #60, refill #0, thanks

## 2015-03-17 DIAGNOSIS — M79662 Pain in left lower leg: Secondary | ICD-10-CM | POA: Diagnosis not present

## 2015-03-17 DIAGNOSIS — M79672 Pain in left foot: Secondary | ICD-10-CM | POA: Diagnosis not present

## 2015-03-17 DIAGNOSIS — W19XXXA Unspecified fall, initial encounter: Secondary | ICD-10-CM | POA: Diagnosis not present

## 2015-03-17 DIAGNOSIS — S8265XA Nondisplaced fracture of lateral malleolus of left fibula, initial encounter for closed fracture: Secondary | ICD-10-CM | POA: Diagnosis not present

## 2015-03-23 DIAGNOSIS — R300 Dysuria: Secondary | ICD-10-CM | POA: Diagnosis not present

## 2015-03-23 DIAGNOSIS — Z6831 Body mass index (BMI) 31.0-31.9, adult: Secondary | ICD-10-CM | POA: Diagnosis not present

## 2015-03-23 DIAGNOSIS — R319 Hematuria, unspecified: Secondary | ICD-10-CM | POA: Diagnosis not present

## 2015-03-23 DIAGNOSIS — N390 Urinary tract infection, site not specified: Secondary | ICD-10-CM | POA: Diagnosis not present

## 2015-03-30 ENCOUNTER — Other Ambulatory Visit: Payer: Self-pay | Admitting: Family Medicine

## 2015-03-31 NOTE — Telephone Encounter (Signed)
RN staff - please call in Tramadol 50 mg Q 6 hours prn pain, dispense #50, refill #0, thanks.  

## 2015-04-03 ENCOUNTER — Other Ambulatory Visit: Payer: Self-pay | Admitting: Family Medicine

## 2015-04-03 NOTE — Telephone Encounter (Signed)
Rx called into patient pharmacy. 

## 2015-04-04 DIAGNOSIS — S8265XD Nondisplaced fracture of lateral malleolus of left fibula, subsequent encounter for closed fracture with routine healing: Secondary | ICD-10-CM | POA: Diagnosis not present

## 2015-04-04 DIAGNOSIS — M84375A Stress fracture, left foot, initial encounter for fracture: Secondary | ICD-10-CM | POA: Diagnosis not present

## 2015-04-05 ENCOUNTER — Other Ambulatory Visit: Payer: Self-pay | Admitting: Foot & Ankle Surgery

## 2015-04-05 ENCOUNTER — Ambulatory Visit: Payer: Medicare Other | Admitting: Internal Medicine

## 2015-04-05 DIAGNOSIS — M84375A Stress fracture, left foot, initial encounter for fracture: Secondary | ICD-10-CM

## 2015-04-05 DIAGNOSIS — S8263XD Displaced fracture of lateral malleolus of unspecified fibula, subsequent encounter for closed fracture with routine healing: Secondary | ICD-10-CM

## 2015-04-10 DIAGNOSIS — Z6831 Body mass index (BMI) 31.0-31.9, adult: Secondary | ICD-10-CM | POA: Diagnosis not present

## 2015-04-10 DIAGNOSIS — N390 Urinary tract infection, site not specified: Secondary | ICD-10-CM | POA: Diagnosis not present

## 2015-04-10 DIAGNOSIS — R300 Dysuria: Secondary | ICD-10-CM | POA: Diagnosis not present

## 2015-04-10 DIAGNOSIS — R319 Hematuria, unspecified: Secondary | ICD-10-CM | POA: Diagnosis not present

## 2015-04-12 ENCOUNTER — Encounter: Payer: Self-pay | Admitting: Vascular Surgery

## 2015-04-13 ENCOUNTER — Ambulatory Visit (INDEPENDENT_AMBULATORY_CARE_PROVIDER_SITE_OTHER): Payer: Medicare Other | Admitting: Vascular Surgery

## 2015-04-13 ENCOUNTER — Encounter: Payer: Self-pay | Admitting: Vascular Surgery

## 2015-04-13 ENCOUNTER — Ambulatory Visit: Payer: Medicare Other | Admitting: Internal Medicine

## 2015-04-13 VITALS — BP 132/61 | HR 77 | Ht 60.0 in | Wt 160.0 lb

## 2015-04-13 DIAGNOSIS — I6521 Occlusion and stenosis of right carotid artery: Secondary | ICD-10-CM

## 2015-04-13 NOTE — Progress Notes (Signed)
Patient is a 69 year old female who underwent right carotid endarterectomy. She presents today for postoperative follow-up. Her voice is returning to normal although still week after using it extensively. She is still unable to sing. Her swallowing has essentially return to baseline.  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 She will continue her aspirin daily. Dr. Haroldine Laws recently saw the patient and daughter vocal cord should recover with time as well.  Fabienne Bruns, MD [facility] [place] Office: [phone] Pager: [phone]

## 2015-04-14 ENCOUNTER — Ambulatory Visit
Admission: RE | Admit: 2015-04-14 | Discharge: 2015-04-14 | Disposition: A | Discharge status: Home / Self Care | Payer: Medicare Other | Source: Ambulatory Visit | Attending: Foot & Ankle Surgery | Admitting: Foot & Ankle Surgery

## 2015-04-14 DIAGNOSIS — R600 Localized edema: Secondary | ICD-10-CM | POA: Diagnosis not present

## 2015-04-14 DIAGNOSIS — S8263XD Displaced fracture of lateral malleolus of unspecified fibula, subsequent encounter for closed fracture with routine healing: Secondary | ICD-10-CM

## 2015-04-14 DIAGNOSIS — M84375A Stress fracture, left foot, initial encounter for fracture: Secondary | ICD-10-CM

## 2015-04-14 DIAGNOSIS — M25472 Effusion, left ankle: Secondary | ICD-10-CM | POA: Diagnosis not present

## 2015-04-14 MED ORDER — GADOBENATE DIMEGLUMINE 529 MG/ML IV SOLN: 14.0000 mL | Freq: Once | INTRAVENOUS | Status: DC | PRN

## 2015-04-14 NOTE — Addendum Note (Signed)
Addended by: Adria Dill L on: 04/14/2015 11:42 AM   Modules accepted: Orders

## 2015-04-18 DIAGNOSIS — S9032XD Contusion of left foot, subsequent encounter: Secondary | ICD-10-CM | POA: Diagnosis not present

## 2015-04-18 DIAGNOSIS — S93412D Sprain of calcaneofibular ligament of left ankle, subsequent encounter: Secondary | ICD-10-CM | POA: Diagnosis not present

## 2015-04-18 DIAGNOSIS — R600 Localized edema: Secondary | ICD-10-CM | POA: Diagnosis not present

## 2015-04-28 DIAGNOSIS — S93412D Sprain of calcaneofibular ligament of left ankle, subsequent encounter: Secondary | ICD-10-CM | POA: Diagnosis not present

## 2015-05-05 ENCOUNTER — Other Ambulatory Visit: Payer: Self-pay | Admitting: Family Medicine

## 2015-05-10 DIAGNOSIS — M25511 Pain in right shoulder: Secondary | ICD-10-CM | POA: Diagnosis not present

## 2015-05-10 DIAGNOSIS — Z6831 Body mass index (BMI) 31.0-31.9, adult: Secondary | ICD-10-CM | POA: Diagnosis not present

## 2015-05-16 ENCOUNTER — Ambulatory Visit (INDEPENDENT_AMBULATORY_CARE_PROVIDER_SITE_OTHER): Payer: Medicare Other | Admitting: Internal Medicine

## 2015-05-16 ENCOUNTER — Encounter: Payer: Self-pay | Admitting: Internal Medicine

## 2015-05-16 VITALS — BP 134/88 | HR 94 | Ht 60.0 in | Wt 160.0 lb

## 2015-05-16 DIAGNOSIS — I10 Essential (primary) hypertension: Secondary | ICD-10-CM

## 2015-05-16 DIAGNOSIS — I5032 Chronic diastolic (congestive) heart failure: Secondary | ICD-10-CM

## 2015-05-16 DIAGNOSIS — I200 Unstable angina: Secondary | ICD-10-CM

## 2015-05-16 DIAGNOSIS — R0602 Shortness of breath: Secondary | ICD-10-CM

## 2015-05-16 DIAGNOSIS — Z6831 Body mass index (BMI) 31.0-31.9, adult: Secondary | ICD-10-CM | POA: Diagnosis not present

## 2015-05-16 DIAGNOSIS — Z79899 Other long term (current) drug therapy: Secondary | ICD-10-CM

## 2015-05-16 DIAGNOSIS — E039 Hypothyroidism, unspecified: Secondary | ICD-10-CM

## 2015-05-16 DIAGNOSIS — E785 Hyperlipidemia, unspecified: Secondary | ICD-10-CM

## 2015-05-16 DIAGNOSIS — R002 Palpitations: Secondary | ICD-10-CM | POA: Diagnosis not present

## 2015-05-16 DIAGNOSIS — I6521 Occlusion and stenosis of right carotid artery: Secondary | ICD-10-CM

## 2015-05-16 DIAGNOSIS — N3090 Cystitis, unspecified without hematuria: Secondary | ICD-10-CM | POA: Diagnosis not present

## 2015-05-16 NOTE — Patient Instructions (Signed)
Your physician recommends that you return for lab work - in the next few weeks  Your physician wants you to follow-up in: 6 months with Dr. Rennis Golden. You will receive a reminder letter in the mail two months in advance. If you don't receive a letter, please call our office to schedule the follow-up appointment.
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
## 2015-05-18 DIAGNOSIS — Z6831 Body mass index (BMI) 31.0-31.9, adult: Secondary | ICD-10-CM | POA: Diagnosis not present

## 2015-05-18 DIAGNOSIS — M25511 Pain in right shoulder: Secondary | ICD-10-CM | POA: Diagnosis not present

## 2015-05-19 DIAGNOSIS — S93412D Sprain of calcaneofibular ligament of left ankle, subsequent encounter: Secondary | ICD-10-CM | POA: Diagnosis not present

## 2015-05-19 DIAGNOSIS — R0602 Shortness of breath: Secondary | ICD-10-CM | POA: Diagnosis not present

## 2015-05-19 DIAGNOSIS — E039 Hypothyroidism, unspecified: Secondary | ICD-10-CM | POA: Diagnosis not present

## 2015-05-19 DIAGNOSIS — S9032XD Contusion of left foot, subsequent encounter: Secondary | ICD-10-CM | POA: Diagnosis not present

## 2015-05-19 DIAGNOSIS — Z79899 Other long term (current) drug therapy: Secondary | ICD-10-CM | POA: Diagnosis not present

## 2015-05-19 DIAGNOSIS — R002 Palpitations: Secondary | ICD-10-CM | POA: Diagnosis not present

## 2015-05-20 LAB — BASIC METABOLIC PANEL
BUN: 11 mg/dL (ref 7–25)
CHLORIDE: 96 mmol/L — AB (ref 98–110)
CO2: 30 mmol/L (ref 20–31)
Calcium: 9.6 mg/dL (ref 8.6–10.4)
Creat: 0.66 mg/dL (ref 0.50–0.99)
Glucose, Bld: 96 mg/dL (ref 65–99)
Potassium: 4 mmol/L (ref 3.5–5.3)
SODIUM: 139 mmol/L (ref 135–146)

## 2015-05-20 LAB — TSH: TSH: 1.889 u[IU]/mL (ref 0.350–4.500)

## 2015-05-20 LAB — BRAIN NATRIURETIC PEPTIDE: Brain Natriuretic Peptide: 55.3 pg/mL (ref 0.0–100.0)

## 2015-05-20 LAB — MAGNESIUM: Magnesium: 1.8 mg/dL (ref 1.5–2.5)

## 2015-05-26 ENCOUNTER — Other Ambulatory Visit: Payer: Self-pay | Admitting: Family Medicine

## 2015-05-26 ENCOUNTER — Telehealth: Payer: Self-pay | Admitting: Internal Medicine

## 2015-05-26 NOTE — Telephone Encounter (Signed)
Patient wanting to know lab results stated it has been a week

## 2015-05-26 NOTE — Telephone Encounter (Signed)
Lab results given to patient.

## 2015-05-31 ENCOUNTER — Other Ambulatory Visit: Payer: Self-pay | Admitting: Family Medicine

## 2015-05-31 MED ORDER — SUMATRIPTAN SUCCINATE 100 MG PO TABS: 100.0000 mg | ORAL_TABLET | Freq: Once | ORAL | Status: DC | PRN

## 2015-05-31 MED ORDER — TRAMADOL HCL 50 MG PO TABS: 50.0000 mg | ORAL_TABLET | Freq: Four times a day (QID) | ORAL | Status: DC | PRN

## 2015-05-31 NOTE — Telephone Encounter (Signed)
Pt is checking status of refill for tramadol and imitrex, says she has been waiting over a week.

## 2015-05-31 NOTE — Telephone Encounter (Signed)
RN staff - please call in Tramadol 50 mg Q 6 hours prn pain, dispense #50, refill #0.  Please inform patient that Tramadol was called in and that Imitrex was sent to pharmacy. Please also tell her that I did not receive an initial prescription request and that is why it was not sent in (see front office staff note)

## 2015-06-01 NOTE — Telephone Encounter (Signed)
Rx called in, patient informed 

## 2015-06-06 DIAGNOSIS — N390 Urinary tract infection, site not specified: Secondary | ICD-10-CM | POA: Diagnosis not present

## 2015-06-06 DIAGNOSIS — J019 Acute sinusitis, unspecified: Secondary | ICD-10-CM | POA: Diagnosis not present

## 2015-06-18 ENCOUNTER — Other Ambulatory Visit: Payer: Self-pay | Admitting: Family Medicine

## 2015-06-20 DIAGNOSIS — Z6831 Body mass index (BMI) 31.0-31.9, adult: Secondary | ICD-10-CM | POA: Diagnosis not present

## 2015-06-20 DIAGNOSIS — M25512 Pain in left shoulder: Secondary | ICD-10-CM | POA: Diagnosis not present

## 2015-06-26 DIAGNOSIS — M25512 Pain in left shoulder: Secondary | ICD-10-CM | POA: Diagnosis not present

## 2015-06-26 DIAGNOSIS — M779 Enthesopathy, unspecified: Secondary | ICD-10-CM | POA: Diagnosis not present

## 2015-06-26 DIAGNOSIS — S46812A Strain of other muscles, fascia and tendons at shoulder and upper arm level, left arm, initial encounter: Secondary | ICD-10-CM | POA: Diagnosis not present

## 2015-06-26 DIAGNOSIS — M75102 Unspecified rotator cuff tear or rupture of left shoulder, not specified as traumatic: Secondary | ICD-10-CM | POA: Diagnosis not present

## 2015-06-30 DIAGNOSIS — M1990 Unspecified osteoarthritis, unspecified site: Secondary | ICD-10-CM | POA: Diagnosis not present

## 2015-06-30 DIAGNOSIS — M75101 Unspecified rotator cuff tear or rupture of right shoulder, not specified as traumatic: Secondary | ICD-10-CM | POA: Diagnosis not present

## 2015-06-30 DIAGNOSIS — Z6831 Body mass index (BMI) 31.0-31.9, adult: Secondary | ICD-10-CM | POA: Diagnosis not present

## 2015-07-05 ENCOUNTER — Other Ambulatory Visit: Payer: Self-pay | Admitting: Family Medicine

## 2015-07-06 ENCOUNTER — Other Ambulatory Visit: Payer: Self-pay | Admitting: Family Medicine

## 2015-07-06 NOTE — Telephone Encounter (Signed)
Checking status of imitrex, Pt goes to [facility] [phone]

## 2015-07-07 MED ORDER — SUMATRIPTAN SUCCINATE 100 MG PO TABS
100.0000 mg | ORAL_TABLET | Freq: Once | ORAL | Status: DC | PRN
Start: 2015-07-07 — End: 2015-08-03

## 2015-07-18 DIAGNOSIS — S93402D Sprain of unspecified ligament of left ankle, subsequent encounter: Secondary | ICD-10-CM | POA: Diagnosis not present

## 2015-07-24 ENCOUNTER — Other Ambulatory Visit: Payer: Self-pay | Admitting: Family Medicine

## 2015-07-25 NOTE — Telephone Encounter (Signed)
RN staff - please call in Tramadol 50 mg Q 6 hours prn pain, dispense #50, refill #0, thanks.  

## 2015-07-27 HISTORY — PX: SHOULDER SURGERY: SHX246

## 2015-07-28 NOTE — Telephone Encounter (Signed)
Rx called in. Deboraha Goar Dawn  

## 2015-08-03 ENCOUNTER — Other Ambulatory Visit: Payer: Self-pay | Admitting: Family Medicine

## 2015-08-03 DIAGNOSIS — Z01818 Encounter for other preprocedural examination: Secondary | ICD-10-CM | POA: Diagnosis not present

## 2015-08-03 DIAGNOSIS — M75101 Unspecified rotator cuff tear or rupture of right shoulder, not specified as traumatic: Secondary | ICD-10-CM | POA: Diagnosis not present

## 2015-08-03 DIAGNOSIS — Z6831 Body mass index (BMI) 31.0-31.9, adult: Secondary | ICD-10-CM | POA: Diagnosis not present

## 2015-08-07 ENCOUNTER — Telehealth: Payer: Self-pay | Admitting: Internal Medicine

## 2015-08-07 NOTE — Telephone Encounter (Signed)
Low risk for shoulder surgery - ok to proceed. Ok to hold aspirin 7 days prior to reduce bleeding risk.  Dr. Rexene Edison

## 2015-08-07 NOTE — Telephone Encounter (Signed)
Theresa Brennan is calling to say that she is having Shoulder surgery and is wanting to know if it is ok for her to have ,.. Please call   Thanks

## 2015-08-07 NOTE — Telephone Encounter (Signed)
Returned call to patient she stated she is scheduled to have shoulder surgery this Wed 08/09/15.She wanted to make sure ok with Dr.Hilty.Advised Dr.Hilty out of office.I will send message to him for advice.

## 2015-08-08 NOTE — Telephone Encounter (Signed)
Returned call to patient Dr.Hilty advised ok to have shoulder surgery.He advised hold aspirin 7 days prior to surgery.Stated Dr.Popa in [facility] will be doing surgery and he does not need note faxed.

## 2015-08-09 DIAGNOSIS — S43421A Sprain of right rotator cuff capsule, initial encounter: Secondary | ICD-10-CM | POA: Diagnosis not present

## 2015-08-09 DIAGNOSIS — M19011 Primary osteoarthritis, right shoulder: Secondary | ICD-10-CM | POA: Diagnosis not present

## 2015-08-09 DIAGNOSIS — M1990 Unspecified osteoarthritis, unspecified site: Secondary | ICD-10-CM | POA: Diagnosis not present

## 2015-08-09 DIAGNOSIS — M75101 Unspecified rotator cuff tear or rupture of right shoulder, not specified as traumatic: Secondary | ICD-10-CM | POA: Diagnosis not present

## 2015-08-11 ENCOUNTER — Encounter: Payer: Self-pay | Admitting: Family

## 2015-08-14 DIAGNOSIS — M25511 Pain in right shoulder: Secondary | ICD-10-CM | POA: Diagnosis not present

## 2015-08-14 DIAGNOSIS — M6281 Muscle weakness (generalized): Secondary | ICD-10-CM | POA: Diagnosis not present

## 2015-08-14 DIAGNOSIS — M25611 Stiffness of right shoulder, not elsewhere classified: Secondary | ICD-10-CM | POA: Diagnosis not present

## 2015-08-17 ENCOUNTER — Encounter (HOSPITAL_COMMUNITY): Payer: Medicare Other

## 2015-08-17 ENCOUNTER — Ambulatory Visit: Payer: Medicare Other | Admitting: Family

## 2015-08-30 DIAGNOSIS — M25611 Stiffness of right shoulder, not elsewhere classified: Secondary | ICD-10-CM | POA: Diagnosis not present

## 2015-08-30 DIAGNOSIS — M25511 Pain in right shoulder: Secondary | ICD-10-CM | POA: Diagnosis not present

## 2015-08-30 DIAGNOSIS — M6281 Muscle weakness (generalized): Secondary | ICD-10-CM | POA: Diagnosis not present

## 2015-08-31 ENCOUNTER — Other Ambulatory Visit: Payer: Self-pay | Admitting: Family Medicine

## 2015-09-07 ENCOUNTER — Other Ambulatory Visit: Payer: Self-pay | Admitting: Family Medicine

## 2015-09-08 DIAGNOSIS — J322 Chronic ethmoidal sinusitis: Secondary | ICD-10-CM | POA: Diagnosis not present

## 2015-09-08 DIAGNOSIS — H6121 Impacted cerumen, right ear: Secondary | ICD-10-CM | POA: Diagnosis not present

## 2015-09-08 DIAGNOSIS — J301 Allergic rhinitis due to pollen: Secondary | ICD-10-CM | POA: Diagnosis not present

## 2015-09-08 DIAGNOSIS — J320 Chronic maxillary sinusitis: Secondary | ICD-10-CM | POA: Diagnosis not present

## 2015-09-08 DIAGNOSIS — H6062 Unspecified chronic otitis externa, left ear: Secondary | ICD-10-CM | POA: Diagnosis not present

## 2015-09-08 DIAGNOSIS — J040 Acute laryngitis: Secondary | ICD-10-CM | POA: Diagnosis not present

## 2015-09-13 ENCOUNTER — Telehealth: Payer: Self-pay | Admitting: Family Medicine

## 2015-09-13 ENCOUNTER — Other Ambulatory Visit: Payer: Self-pay | Admitting: Family Medicine

## 2015-09-13 NOTE — Telephone Encounter (Signed)
RN staff - please call in Tramadol 50 mg Q 6 hours prn pain, dispense #50, refill #0, thanks.  

## 2015-09-14 DIAGNOSIS — M6281 Muscle weakness (generalized): Secondary | ICD-10-CM | POA: Diagnosis not present

## 2015-09-14 DIAGNOSIS — M25611 Stiffness of right shoulder, not elsewhere classified: Secondary | ICD-10-CM | POA: Diagnosis not present

## 2015-09-14 DIAGNOSIS — M25511 Pain in right shoulder: Secondary | ICD-10-CM | POA: Diagnosis not present

## 2015-09-15 NOTE — Telephone Encounter (Signed)
Called pharmacy to call in tramadol rx and i was told she got percocet from another dr. Sherron Monday to dr Randolm Idol and he said pt needed to come in and talk about her narcotics. Pt informed and made an appt. Mckaela Howley Bruna Potter, CMA

## 2015-09-18 DIAGNOSIS — M6281 Muscle weakness (generalized): Secondary | ICD-10-CM | POA: Diagnosis not present

## 2015-09-18 DIAGNOSIS — M25611 Stiffness of right shoulder, not elsewhere classified: Secondary | ICD-10-CM | POA: Diagnosis not present

## 2015-09-18 DIAGNOSIS — M25511 Pain in right shoulder: Secondary | ICD-10-CM | POA: Diagnosis not present

## 2015-09-19 ENCOUNTER — Telehealth: Payer: Self-pay | Admitting: *Deleted

## 2015-09-19 NOTE — Telephone Encounter (Signed)
Called patient to offer flu vaccine. Patient has appt with Dr. Randolm Idol this Friday, 09/22/15 and will ask for flu vaccine at that time. Fredderick Severance, RN

## 2015-09-20 DIAGNOSIS — M6281 Muscle weakness (generalized): Secondary | ICD-10-CM | POA: Diagnosis not present

## 2015-09-20 DIAGNOSIS — M25511 Pain in right shoulder: Secondary | ICD-10-CM | POA: Diagnosis not present

## 2015-09-20 DIAGNOSIS — M25611 Stiffness of right shoulder, not elsewhere classified: Secondary | ICD-10-CM | POA: Diagnosis not present

## 2015-09-20 NOTE — Telephone Encounter (Signed)
Attempted to contact, no answer, left message that I would call again on 1/26.

## 2015-09-20 NOTE — Telephone Encounter (Signed)
Patient informed of message from MD, says that she rarely ever takes the percocet unless the pain is unbearable, and that she recently had surgery on her shoulder which was why she had the percocet prescribed. Wants to know if MD will authorize refill of tramadol

## 2015-09-21 ENCOUNTER — Encounter: Payer: Self-pay | Admitting: Family Medicine

## 2015-09-21 NOTE — Telephone Encounter (Signed)
Patient had recent shoulder shoulder on 12/14. Had percocet prescribed by Dr. Wynetta Emery (Orthopedic Surgeon) for chronic back pain. Need repeat back surgery. Only takes Percocet for severe back pain.   Has appointment on 09/22/15.   Will discuss in more detail tomorrow.   Reviewed [place] [facility] Reporting System -3 prescriptions for percocet filled in past 6 months (3 different providers) -multiple Tramadol prescriptions from my office

## 2015-09-22 ENCOUNTER — Encounter: Payer: Self-pay | Admitting: Family Medicine

## 2015-09-22 ENCOUNTER — Ambulatory Visit (INDEPENDENT_AMBULATORY_CARE_PROVIDER_SITE_OTHER): Payer: Medicare Other | Admitting: Family Medicine

## 2015-09-22 VITALS — BP 154/86 | HR 80 | Temp 97.9°F | Ht 60.0 in | Wt 168.0 lb

## 2015-09-22 DIAGNOSIS — E038 Other specified hypothyroidism: Secondary | ICD-10-CM

## 2015-09-22 DIAGNOSIS — M545 Low back pain, unspecified: Secondary | ICD-10-CM

## 2015-09-22 DIAGNOSIS — Z7189 Other specified counseling: Secondary | ICD-10-CM | POA: Diagnosis not present

## 2015-09-22 DIAGNOSIS — G894 Chronic pain syndrome: Secondary | ICD-10-CM

## 2015-09-22 DIAGNOSIS — I5032 Chronic diastolic (congestive) heart failure: Secondary | ICD-10-CM | POA: Diagnosis not present

## 2015-09-22 DIAGNOSIS — E039 Hypothyroidism, unspecified: Secondary | ICD-10-CM

## 2015-09-22 DIAGNOSIS — I10 Essential (primary) hypertension: Secondary | ICD-10-CM | POA: Diagnosis not present

## 2015-09-22 DIAGNOSIS — G8929 Other chronic pain: Secondary | ICD-10-CM | POA: Insufficient documentation

## 2015-09-22 MED ORDER — TRAMADOL HCL 50 MG PO TABS: 50.0000 mg | ORAL_TABLET | Freq: Four times a day (QID) | ORAL | Status: DC | PRN

## 2015-09-22 MED ORDER — LEVOTHYROXINE SODIUM 50 MCG PO TABS: 50.0000 ug | ORAL_TABLET | Freq: Every day | ORAL | Status: DC

## 2015-09-22 MED ORDER — FUROSEMIDE 20 MG PO TABS: 20.0000 mg | ORAL_TABLET | Freq: Every day | ORAL | Status: DC

## 2015-09-22 NOTE — Patient Instructions (Signed)
Thank you for coming in today to discuss your pain medications.  Dr. Randolm Idol has provided you a prescription for Tramadol.  Your blood pressure was slightly high, likely due to pain, return in one month to recheck.  A refill for your Synthroid has been sent to the pharmacy.  We will also recheck your potassium at your next visit.

## 2015-09-22 NOTE — Assessment & Plan Note (Signed)
Reviewed TSH from 04/2015 which was in normal range. -refill of synthroid 50 mcg sent to pharmacy

## 2015-09-22 NOTE — Assessment & Plan Note (Signed)
Blood Pressure mildly elevated. Suspect due to pain from recent shoulder surgery -continue Diazide -recheck in one month, if elevated consider escalating therapy

## 2015-09-22 NOTE — Assessment & Plan Note (Signed)
Patient reports increased LE swelling. Reviewed Echo from 12/2014. -initiate trial of Lasix 20 mg daily -return in one month to check symptoms and check BMP

## 2015-09-22 NOTE — Assessment & Plan Note (Signed)
Pain is stable with Tramadol and Flexeril.  -refill of Tramadol provided

## 2015-09-22 NOTE — Assessment & Plan Note (Signed)
Patient presents for discussion of chronic pain. Pain agreement signed and scanned into chart. Obtained initial UDS.  -see overview for further details -prescription for Tramadol Provided -patient told to inform PCP of any new narcotics from [facility]

## 2015-09-22 NOTE — Progress Notes (Signed)
   Subjective:    Patient ID: Theresa Brennan, female    DOB: 10-10-1945, 70 y.o.   MRN: [id number]  HPI  70 y/o female presents for narcotic follow up. Appointment made as RN staff recently called in a Tramadol prescription and pharmacy noted that patient recently had a Percocet script filled from another provider.   Chronic Pain: Recent shoulder surgery by Dr. Regis Bill ([facility]) in December 2016.  Back Surgeon (Dr. Wynetta Emery)  Tramadol - taking one per night, when goes to bed, takes for back spasm, very seldom needs during the day, would like to start taking when she has PT for recent shoulder surgery.    Percocet - prescribed by Dr. Wynetta Emery, takes very infrequently (60 tablets last her a year). Also had percocet for foot surgery last year, last Percocet was on 1/26  Flexeril - Taking one per night for back spasms, last one last night  Hypothyroid - taking synthroid 50 mg daily, no changes in bowels, slight hair loss  HTN - on Diazide daily, no chest pain, does have chronic migraines  Grade 2 Diastolic Dysfunction - no sob, swelling in bilateral legs  Social - lives with husband   Review of Systems  Constitutional: Negative for fever, chills and fatigue.  Respiratory: Negative for cough and shortness of breath.   Cardiovascular: Positive for leg swelling. Negative for chest pain.  Gastrointestinal: Negative for nausea, vomiting and diarrhea.  Musculoskeletal: Positive for back pain and arthralgias.       Objective:   Physical Exam Vitals: reviewed Gen: pleasant female, NAD, accompanied by husband Cardiac: RRR, S1 and S2 present, no murmur Resp: CTAB, normal effort Ext: 1+ edema bilateral legs MSK: right shoulder sling  Reviewed labs from previous 12 months      Assessment & Plan:  Encounter for chronic pain management Patient presents for discussion of chronic pain. Pain agreement signed and scanned into chart. Obtained initial UDS.  -see overview for further  details -prescription for Tramadol Provided -patient told to inform PCP of any new narcotics from [facility]  Lumbar back pain Pain is stable with Tramadol and Flexeril.  -refill of Tramadol provided  HYPERTENSION, BENIGN Blood Pressure mildly elevated. Suspect due to pain from recent shoulder surgery -continue Diazide -recheck in one month, if elevated consider escalating therapy  Chronic diastolic heart failure Patient reports increased LE swelling. Reviewed Echo from 12/2014. -initiate trial of Lasix 20 mg daily -return in one month to check symptoms and check BMP  Subclinical hypothyroidism Reviewed TSH from 04/2015 which was in normal range. -refill of synthroid 50 mcg sent to pharmacy

## 2015-09-23 LAB — DRUG SCR UR, PAIN MGMT, REFLEX CONF
AMPHETAMINE SCRN UR: NEGATIVE
Barbiturate Quant, Ur: NEGATIVE
Benzodiazepines.: NEGATIVE
Cocaine Metabolites: NEGATIVE
Creatinine,U: 59.8 mg/dL
METHADONE: NEGATIVE
Marijuana Metabolite: NEGATIVE
PROPOXYPHENE: NEGATIVE
Phencyclidine (PCP): NEGATIVE

## 2015-09-25 DIAGNOSIS — M25511 Pain in right shoulder: Secondary | ICD-10-CM | POA: Diagnosis not present

## 2015-09-25 DIAGNOSIS — M25611 Stiffness of right shoulder, not elsewhere classified: Secondary | ICD-10-CM | POA: Diagnosis not present

## 2015-09-25 DIAGNOSIS — M6281 Muscle weakness (generalized): Secondary | ICD-10-CM | POA: Diagnosis not present

## 2015-09-27 ENCOUNTER — Encounter: Payer: Self-pay | Admitting: Family

## 2015-09-27 DIAGNOSIS — M25511 Pain in right shoulder: Secondary | ICD-10-CM | POA: Diagnosis not present

## 2015-09-27 DIAGNOSIS — M6281 Muscle weakness (generalized): Secondary | ICD-10-CM | POA: Diagnosis not present

## 2015-09-27 DIAGNOSIS — M25611 Stiffness of right shoulder, not elsewhere classified: Secondary | ICD-10-CM | POA: Diagnosis not present

## 2015-09-27 LAB — OPIATES/OPIOIDS (LC/MS-MS)
Codeine Urine: NEGATIVE ng/mL (ref <50)
Hydrocodone: 207 ng/mL — ABNORMAL HIGH (ref <50)
Hydromorphone: NEGATIVE ng/mL (ref <50)
MORPHINE: NEGATIVE ng/mL (ref <50)
Norhydrocodone, Ur: 539 ng/mL — ABNORMAL HIGH (ref <50)
Noroxycodone, Ur: 1224 ng/mL — ABNORMAL HIGH (ref <50)
OXYCODONE, UR: 1391 ng/mL — AB (ref <50)
OXYMORPHONE, URINE: 370 ng/mL — AB (ref <50)

## 2015-10-03 ENCOUNTER — Encounter (HOSPITAL_COMMUNITY): Payer: Medicare Other

## 2015-10-03 ENCOUNTER — Other Ambulatory Visit: Payer: Self-pay | Admitting: Family Medicine

## 2015-10-03 ENCOUNTER — Ambulatory Visit: Payer: Medicare Other | Admitting: Family

## 2015-10-04 ENCOUNTER — Emergency Department (HOSPITAL_COMMUNITY)
Admission: EM | Admit: 2015-10-04 | Discharge: 2015-10-05 | Disposition: A | Discharge status: Home / Self Care | Payer: Medicare Other | Attending: Emergency Medicine | Admitting: Emergency Medicine

## 2015-10-04 ENCOUNTER — Encounter (HOSPITAL_COMMUNITY): Payer: Self-pay | Admitting: Emergency Medicine

## 2015-10-04 DIAGNOSIS — R011 Cardiac murmur, unspecified: Secondary | ICD-10-CM | POA: Insufficient documentation

## 2015-10-04 DIAGNOSIS — R600 Localized edema: Secondary | ICD-10-CM | POA: Insufficient documentation

## 2015-10-04 DIAGNOSIS — G8929 Other chronic pain: Secondary | ICD-10-CM | POA: Insufficient documentation

## 2015-10-04 DIAGNOSIS — Z6832 Body mass index (BMI) 32.0-32.9, adult: Secondary | ICD-10-CM | POA: Diagnosis not present

## 2015-10-04 DIAGNOSIS — E876 Hypokalemia: Secondary | ICD-10-CM | POA: Insufficient documentation

## 2015-10-04 DIAGNOSIS — M1990 Unspecified osteoarthritis, unspecified site: Secondary | ICD-10-CM | POA: Insufficient documentation

## 2015-10-04 DIAGNOSIS — Z862 Personal history of diseases of the blood and blood-forming organs and certain disorders involving the immune mechanism: Secondary | ICD-10-CM | POA: Diagnosis not present

## 2015-10-04 DIAGNOSIS — Z79899 Other long term (current) drug therapy: Secondary | ICD-10-CM | POA: Insufficient documentation

## 2015-10-04 DIAGNOSIS — N189 Chronic kidney disease, unspecified: Secondary | ICD-10-CM | POA: Diagnosis not present

## 2015-10-04 DIAGNOSIS — K219 Gastro-esophageal reflux disease without esophagitis: Secondary | ICD-10-CM | POA: Insufficient documentation

## 2015-10-04 DIAGNOSIS — Z880 Allergy status to penicillin: Secondary | ICD-10-CM | POA: Insufficient documentation

## 2015-10-04 DIAGNOSIS — Z8709 Personal history of other diseases of the respiratory system: Secondary | ICD-10-CM | POA: Diagnosis not present

## 2015-10-04 DIAGNOSIS — E785 Hyperlipidemia, unspecified: Secondary | ICD-10-CM | POA: Diagnosis not present

## 2015-10-04 DIAGNOSIS — T502X5A Adverse effect of carbonic-anhydrase inhibitors, benzothiadiazides and other diuretics, initial encounter: Secondary | ICD-10-CM

## 2015-10-04 DIAGNOSIS — R110 Nausea: Secondary | ICD-10-CM | POA: Diagnosis not present

## 2015-10-04 DIAGNOSIS — R5383 Other fatigue: Secondary | ICD-10-CM | POA: Diagnosis not present

## 2015-10-04 DIAGNOSIS — Z7982 Long term (current) use of aspirin: Secondary | ICD-10-CM | POA: Diagnosis not present

## 2015-10-04 DIAGNOSIS — G43909 Migraine, unspecified, not intractable, without status migrainosus: Secondary | ICD-10-CM | POA: Diagnosis not present

## 2015-10-04 DIAGNOSIS — Z87891 Personal history of nicotine dependence: Secondary | ICD-10-CM | POA: Insufficient documentation

## 2015-10-04 DIAGNOSIS — Z8742 Personal history of other diseases of the female genital tract: Secondary | ICD-10-CM | POA: Diagnosis not present

## 2015-10-04 DIAGNOSIS — Z87440 Personal history of urinary (tract) infections: Secondary | ICD-10-CM | POA: Insufficient documentation

## 2015-10-04 DIAGNOSIS — E039 Hypothyroidism, unspecified: Secondary | ICD-10-CM | POA: Insufficient documentation

## 2015-10-04 DIAGNOSIS — Z9889 Other specified postprocedural states: Secondary | ICD-10-CM | POA: Insufficient documentation

## 2015-10-04 DIAGNOSIS — Z8611 Personal history of tuberculosis: Secondary | ICD-10-CM | POA: Diagnosis not present

## 2015-10-04 DIAGNOSIS — T501X5A Adverse effect of loop [high-ceiling] diuretics, initial encounter: Secondary | ICD-10-CM | POA: Diagnosis not present

## 2015-10-04 DIAGNOSIS — R531 Weakness: Secondary | ICD-10-CM | POA: Diagnosis not present

## 2015-10-04 LAB — CBC WITH DIFFERENTIAL/PLATELET
BASOS ABS: 0.1 10*3/uL (ref 0.0–0.1)
BASOS PCT: 1 %
EOS PCT: 1 %
Eosinophils Absolute: 0.1 10*3/uL (ref 0.0–0.7)
HCT: 40.7 % (ref 36.0–46.0)
HEMOGLOBIN: 13.6 g/dL (ref 12.0–15.0)
LYMPHS ABS: 2.6 10*3/uL (ref 0.7–4.0)
Lymphocytes Relative: 26 %
MCH: 29.8 pg (ref 26.0–34.0)
MCHC: 33.4 g/dL (ref 30.0–36.0)
MCV: 89.1 fL (ref 78.0–100.0)
Monocytes Absolute: 0.6 10*3/uL (ref 0.1–1.0)
Monocytes Relative: 6 %
NEUTROS PCT: 66 %
Neutro Abs: 6.6 10*3/uL (ref 1.7–7.7)
Platelets: 412 10*3/uL — ABNORMAL HIGH (ref 150–400)
RBC: 4.57 MIL/uL (ref 3.87–5.11)
RDW: 13.8 % (ref 11.5–15.5)
WBC: 9.9 10*3/uL (ref 4.0–10.5)

## 2015-10-04 LAB — COMPREHENSIVE METABOLIC PANEL
ALT: 18 U/L (ref 14–54)
AST: 25 U/L (ref 15–41)
Albumin: 3.6 g/dL (ref 3.5–5.0)
Alkaline Phosphatase: 86 U/L (ref 38–126)
Anion gap: 17 — ABNORMAL HIGH (ref 5–15)
BILIRUBIN TOTAL: 0.7 mg/dL (ref 0.3–1.2)
BUN: 14 mg/dL (ref 6–20)
CALCIUM: 9.6 mg/dL (ref 8.9–10.3)
CHLORIDE: 95 mmol/L — AB (ref 101–111)
CO2: 27 mmol/L (ref 22–32)
CREATININE: 0.83 mg/dL (ref 0.44–1.00)
Glucose, Bld: 108 mg/dL — ABNORMAL HIGH (ref 65–99)
Potassium: 2.5 mmol/L — CL (ref 3.5–5.1)
Sodium: 139 mmol/L (ref 135–145)
TOTAL PROTEIN: 7.7 g/dL (ref 6.5–8.1)

## 2015-10-04 MED ORDER — POTASSIUM CHLORIDE 10 MEQ/100ML IV SOLN
10.0000 meq | Freq: Once | INTRAVENOUS | Status: AC
  Administered 2015-10-05: 10 meq via INTRAVENOUS
  Filled 2015-10-04: qty 100

## 2015-10-04 MED ORDER — POTASSIUM CHLORIDE CRYS ER 20 MEQ PO TBCR
40.0000 meq | EXTENDED_RELEASE_TABLET | Freq: Once | ORAL | Status: AC
  Administered 2015-10-05: 40 meq via ORAL
  Filled 2015-10-04: qty 2
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
## 2015-10-04 NOTE — ED Notes (Signed)
The patient said she has no strength.  She said her doctor put her on lasix and she was already Diazide.  She thinks her potassium is low and that is why she is feeling so bad. She denies any other symptoms.

## 2015-10-05 ENCOUNTER — Other Ambulatory Visit: Payer: Self-pay | Admitting: Family Medicine

## 2015-10-05 DIAGNOSIS — E876 Hypokalemia: Secondary | ICD-10-CM | POA: Diagnosis not present

## 2015-10-05 MED ORDER — POTASSIUM CHLORIDE CRYS ER 20 MEQ PO TBCR: 20.0000 meq | EXTENDED_RELEASE_TABLET | Freq: Two times a day (BID) | ORAL | Status: DC

## 2015-10-05 NOTE — ED Notes (Signed)
Iv placed

## 2015-10-05 NOTE — ED Notes (Signed)
IV insertion unsuccessful 

## 2015-10-05 NOTE — Progress Notes (Signed)
Patient recently seen in ED for hypokalemia. Needs recheck. Future order placed. Patient to call to schedule lab appointment.

## 2015-10-05 NOTE — Discharge Instructions (Signed)
Please see your doctor in 1 week to recheck your potassium.  Hypokalemia Hypokalemia means that the amount of potassium in the blood is lower than normal.Potassium is a chemical, called an electrolyte, that helps regulate the amount of fluid in the body. It also stimulates muscle contraction and helps nerves function properly.Most of the body's potassium is inside of cells, and only a very small amount is in the blood. Because the amount in the blood is so small, minor changes can be life-threatening. CAUSES  Antibiotics.  Diarrhea or vomiting.  Using laxatives too much, which can cause diarrhea.  Chronic kidney disease.  Water pills (diuretics).  Eating disorders (bulimia).  Low magnesium level.  Sweating a lot. SIGNS AND SYMPTOMS  Weakness.  Constipation.  Fatigue.  Muscle cramps.  Mental confusion.  Skipped heartbeats or irregular heartbeat (palpitations).  Tingling or numbness. DIAGNOSIS  Your health care provider can diagnose hypokalemia with blood tests. In addition to checking your potassium level, your health care provider may also check other lab tests. TREATMENT Hypokalemia can be treated with potassium supplements taken by mouth or adjustments in your current medicines. If your potassium level is very low, you may need to get potassium through a vein (IV) and be monitored in the hospital. A diet high in potassium is also helpful. Foods high in potassium are:  Nuts, such as peanuts and pistachios.  Seeds, such as sunflower seeds and pumpkin seeds.  Peas, lentils, and lima beans.  Whole grain and bran cereals and breads.  Fresh fruit and vegetables, such as apricots, avocado, bananas, cantaloupe, kiwi, oranges, tomatoes, asparagus, and potatoes.  Orange and tomato juices.  Red meats.  Fruit yogurt. HOME CARE INSTRUCTIONS  Take all medicines as prescribed by your health care provider.  Maintain a healthy diet by including nutritious food, such as  fruits, vegetables, nuts, whole grains, and lean meats.  If you are taking a laxative, be sure to follow the directions on the label. SEEK MEDICAL CARE IF:  Your weakness gets worse.  You feel your heart pounding or racing.  You are vomiting or having diarrhea.  You are diabetic and having trouble keeping your blood glucose in the normal range. SEEK IMMEDIATE MEDICAL CARE IF:  You have chest pain, shortness of breath, or dizziness.  You are vomiting or having diarrhea for more than 2 days.  You faint. MAKE SURE YOU:   Understand these instructions.  Will watch your condition.  Will get help right away if you are not doing well or get worse.   This information is not intended to replace advice given to you by your health care provider. Make sure you discuss any questions you have with your health care provider.   Document Released: 08/12/2005 Document Revised: 09/02/2014 Document Reviewed: 02/12/2013 Elsevier Interactive Patient Education 2016 Elsevier Inc.  Potassium Salts tablets, extended-release tablets or capsules What is this medicine? POTASSIUM (poe TASS i um) is a natural salt that is important for the heart, muscles, and nerves. It is found in many foods and is normally supplied by a well balanced diet. This medicine is used to treat low potassium. This medicine may be used for other purposes; ask your health care provider or pharmacist if you have questions. What should I tell my health care provider before I take this medicine? They need to know if you have any of these conditions: -Addison's disease -dehydration -diabetes -difficulty swallowing -heart disease -history of high levels of potassium in the blood -irregular heartbeat -kidney disease -  recent severe burn -stomach ulcers or other stomach problems -an unusual or allergic reaction to potassium, tartrazine, other medicines, foods, dyes, or preservatives -pregnant or trying to get  pregnant -breast-feeding How should I use this medicine? Take this medicine by mouth with a full glass of water. Take with food. Follow the directions on the prescription label. Do not suck on, crush, or chew this medicine. If you have difficulty swallowing, ask the pharmacist how to take. Take your medicine at regular intervals. Do not take it more often than directed. Do not stop taking except on your doctor's advice. Talk to your pediatrician regarding the use of this medicine in children. Special care may be needed. Overdosage: If you think you have taken too much of this medicine contact a poison control center or emergency room at once. NOTE: This medicine is only for you. Do not share this medicine with others. What if I miss a dose? If you miss a dose, take it as soon as you can. If it is almost time for your next dose, take only that dose. Do not take double or extra doses. What may interact with this medicine? Do not take this medicine with any of the following medications: -eplerenone -certain medicines for stomach problems like atropine; difenoxin and glycopyrrolate -sodium polystyrene sulfonate This medicine may also interact with the following medications: -certain medicines for blood pressure or heart disease like lisinopril, losartan, quinapril, valsartan -medicines for cold or allergies -NSAIDs, medicines for pain and inflammation, like ibuprofen or napoxen -other potassium supplements -salt substitutes -some diuretics This list may not describe all possible interactions. Give your health care provider a list of all the medicines, herbs, non-prescription drugs, or dietary supplements you use. Also tell them if you smoke, drink alcohol, or use illegal drugs. Some items may interact with your medicine. What should I watch for while using this medicine? Visit your doctor or health care professional for regular check ups. You will need lab work done regularly. You may need to be  on a special diet while taking this medicine. Ask your doctor. What side effects may I notice from receiving this medicine? Side effects that you should report to your doctor or health care professional as soon as possible: -allergic reactions like skin rash, itching or hives, swelling of the face, lips, or tongue -anxious -black, tarry stools -breathing problems -confusion -heartburn -irregular heartbeat -numbness or tingling in hands or feet -pain when swallowing -unusually weak or tired -weakness, heaviness of legs Side effects that usually do not require medical attention (report to your doctor or health care professional if they continue or are bothersome): -diarrhea -nausea -upset stomach -vomiting This list may not describe all possible side effects. Call your doctor for medical advice about side effects. You may report side effects to FDA at 1-800-FDA-1088. Where should I keep my medicine? Keep out of the reach of children. Store at room temperature between 15 and 30 degrees C (59 and 86 degrees F ). Keep bottle closed tightly to protect this medicine from light and moisture. Throw away any unused medicine after the expiration date. NOTE: This sheet is a summary. It may not cover all possible information. If you have questions about this medicine, talk to your doctor, pharmacist, or health care provider.    2016, Elsevier/Gold Standard. (2015-01-19 08:55:21)

## 2015-10-09 DIAGNOSIS — M25611 Stiffness of right shoulder, not elsewhere classified: Secondary | ICD-10-CM | POA: Diagnosis not present

## 2015-10-09 DIAGNOSIS — M6281 Muscle weakness (generalized): Secondary | ICD-10-CM | POA: Diagnosis not present

## 2015-10-09 DIAGNOSIS — M25511 Pain in right shoulder: Secondary | ICD-10-CM | POA: Diagnosis not present

## 2015-10-11 DIAGNOSIS — M25611 Stiffness of right shoulder, not elsewhere classified: Secondary | ICD-10-CM | POA: Diagnosis not present

## 2015-10-11 DIAGNOSIS — M25511 Pain in right shoulder: Secondary | ICD-10-CM | POA: Diagnosis not present

## 2015-10-11 DIAGNOSIS — M6281 Muscle weakness (generalized): Secondary | ICD-10-CM | POA: Diagnosis not present

## 2015-10-12 ENCOUNTER — Other Ambulatory Visit: Payer: Medicare Other

## 2015-10-12 DIAGNOSIS — E876 Hypokalemia: Secondary | ICD-10-CM | POA: Diagnosis not present

## 2015-10-12 LAB — BASIC METABOLIC PANEL WITH GFR
BUN: 8 mg/dL (ref 7–25)
CHLORIDE: 102 mmol/L (ref 98–110)
CO2: 29 mmol/L (ref 20–31)
CREATININE: 0.59 mg/dL (ref 0.50–0.99)
Calcium: 9.8 mg/dL (ref 8.6–10.4)
GFR, Est African American: >89 mL/min (ref >=60)
GFR, Est Non African American: >89 mL/min (ref >=60)
Glucose, Bld: 97 mg/dL (ref 65–99)
POTASSIUM: 4.2 mmol/L (ref 3.5–5.3)
SODIUM: 138 mmol/L (ref 135–146)

## 2015-10-12 NOTE — Progress Notes (Signed)
Bmp done today Theresa Brennan 

## 2015-10-13 ENCOUNTER — Telehealth: Payer: Self-pay | Admitting: Family Medicine

## 2015-10-13 NOTE — Telephone Encounter (Signed)
Patient recently seen in ED with hypokalemia (induced by lasix), has since stopped lasix, taking Klor con 40 mEq daily, potassium back in normal range based on labs from yesterday, patient told to continue current regimen.

## 2015-10-16 ENCOUNTER — Other Ambulatory Visit: Payer: Self-pay | Admitting: Family Medicine

## 2015-10-16 DIAGNOSIS — M25611 Stiffness of right shoulder, not elsewhere classified: Secondary | ICD-10-CM | POA: Diagnosis not present

## 2015-10-16 DIAGNOSIS — M6281 Muscle weakness (generalized): Secondary | ICD-10-CM | POA: Diagnosis not present

## 2015-10-16 DIAGNOSIS — M25511 Pain in right shoulder: Secondary | ICD-10-CM | POA: Diagnosis not present

## 2015-10-18 DIAGNOSIS — M6281 Muscle weakness (generalized): Secondary | ICD-10-CM | POA: Diagnosis not present

## 2015-10-18 DIAGNOSIS — M25611 Stiffness of right shoulder, not elsewhere classified: Secondary | ICD-10-CM | POA: Diagnosis not present

## 2015-10-18 DIAGNOSIS — M25511 Pain in right shoulder: Secondary | ICD-10-CM | POA: Diagnosis not present

## 2015-10-19 ENCOUNTER — Encounter: Payer: Self-pay | Admitting: Family

## 2015-10-23 ENCOUNTER — Other Ambulatory Visit: Payer: Self-pay | Admitting: Family Medicine

## 2015-10-23 DIAGNOSIS — M25511 Pain in right shoulder: Secondary | ICD-10-CM | POA: Diagnosis not present

## 2015-10-23 DIAGNOSIS — M25611 Stiffness of right shoulder, not elsewhere classified: Secondary | ICD-10-CM | POA: Diagnosis not present

## 2015-10-23 DIAGNOSIS — M6281 Muscle weakness (generalized): Secondary | ICD-10-CM | POA: Diagnosis not present

## 2015-10-24 ENCOUNTER — Ambulatory Visit (INDEPENDENT_AMBULATORY_CARE_PROVIDER_SITE_OTHER): Payer: Medicare Other | Admitting: *Deleted

## 2015-10-24 ENCOUNTER — Telehealth: Payer: Self-pay | Admitting: *Deleted

## 2015-10-24 DIAGNOSIS — Z23 Encounter for immunization: Secondary | ICD-10-CM | POA: Diagnosis present

## 2015-10-24 NOTE — Telephone Encounter (Signed)
2nd request.  Montserrath Madding L, RN  

## 2015-10-24 NOTE — Telephone Encounter (Signed)
Called patient to offer flu vaccine. Scheduled patient to receive flu vaccine today at 3:30 Shaily Librizzi, Nickola Major, RN

## 2015-10-25 DIAGNOSIS — M25511 Pain in right shoulder: Secondary | ICD-10-CM | POA: Diagnosis not present

## 2015-10-25 DIAGNOSIS — M6281 Muscle weakness (generalized): Secondary | ICD-10-CM | POA: Diagnosis not present

## 2015-10-25 DIAGNOSIS — M25611 Stiffness of right shoulder, not elsewhere classified: Secondary | ICD-10-CM | POA: Diagnosis not present

## 2015-10-26 ENCOUNTER — Ambulatory Visit: Payer: Medicare Other

## 2015-10-26 DIAGNOSIS — Z124 Encounter for screening for malignant neoplasm of cervix: Secondary | ICD-10-CM | POA: Diagnosis not present

## 2015-10-26 DIAGNOSIS — N951 Menopausal and female climacteric states: Secondary | ICD-10-CM | POA: Diagnosis not present

## 2015-10-26 DIAGNOSIS — Z0000 Encounter for general adult medical examination without abnormal findings: Secondary | ICD-10-CM | POA: Diagnosis not present

## 2015-10-27 ENCOUNTER — Ambulatory Visit (INDEPENDENT_AMBULATORY_CARE_PROVIDER_SITE_OTHER): Payer: Medicare Other | Admitting: Family

## 2015-10-27 ENCOUNTER — Ambulatory Visit (HOSPITAL_COMMUNITY)
Admission: RE | Admit: 2015-10-27 | Discharge: 2015-10-27 | Disposition: A | Discharge status: Home / Self Care | Payer: Medicare Other | Source: Ambulatory Visit | Attending: Vascular Surgery | Admitting: Vascular Surgery

## 2015-10-27 ENCOUNTER — Encounter: Payer: Self-pay | Admitting: Family

## 2015-10-27 VITALS — BP 134/74 | HR 93 | Ht 60.0 in | Wt 169.0 lb

## 2015-10-27 DIAGNOSIS — I6523 Occlusion and stenosis of bilateral carotid arteries: Secondary | ICD-10-CM

## 2015-10-27 DIAGNOSIS — I6521 Occlusion and stenosis of right carotid artery: Secondary | ICD-10-CM

## 2015-10-27 DIAGNOSIS — E785 Hyperlipidemia, unspecified: Secondary | ICD-10-CM | POA: Diagnosis not present

## 2015-10-27 DIAGNOSIS — I6522 Occlusion and stenosis of left carotid artery: Secondary | ICD-10-CM | POA: Insufficient documentation

## 2015-10-27 DIAGNOSIS — Z48812 Encounter for surgical aftercare following surgery on the circulatory system: Secondary | ICD-10-CM

## 2015-10-27 DIAGNOSIS — Z9889 Other specified postprocedural states: Secondary | ICD-10-CM | POA: Diagnosis not present

## 2015-10-27 NOTE — Patient Instructions (Signed)
Stroke Prevention Some medical conditions and behaviors are associated with an increased chance of having a stroke. You may prevent a stroke by making healthy choices and managing medical conditions. HOW CAN I REDUCE MY RISK OF HAVING A STROKE?   Stay physically active. Get at least 30 minutes of activity on most or all days.  Do not smoke. It may also be helpful to avoid exposure to secondhand smoke.  Limit alcohol use. Moderate alcohol use is considered to be:  No more than 2 drinks per day for men.  No more than 1 drink per day for nonpregnant women.  Eat healthy foods. This involves:  Eating 5 or more servings of fruits and vegetables a day.  Making dietary changes that address high blood pressure (hypertension), high cholesterol, diabetes, or obesity.  Manage your cholesterol levels.  Making food choices that are high in fiber and low in saturated fat, trans fat, and cholesterol may control cholesterol levels.  Take any prescribed medicines to control cholesterol as directed by your health care provider.  Manage your diabetes.  Controlling your carbohydrate and sugar intake is recommended to manage diabetes.  Take any prescribed medicines to control diabetes as directed by your health care provider.  Control your hypertension.  Making food choices that are low in salt (sodium), saturated fat, trans fat, and cholesterol is recommended to manage hypertension.  Ask your health care provider if you need treatment to lower your blood pressure. Take any prescribed medicines to control hypertension as directed by your health care provider.  If you are 18-39 years of age, have your blood pressure checked every 3-5 years. If you are 40 years of age or older, have your blood pressure checked every year.  Maintain a healthy weight.  Reducing calorie intake and making food choices that are low in sodium, saturated fat, trans fat, and cholesterol are recommended to manage  weight.  Stop drug abuse.  Avoid taking birth control pills.  Talk to your health care provider about the risks of taking birth control pills if you are over 35 years old, smoke, get migraines, or have ever had a blood clot.  Get evaluated for sleep disorders (sleep apnea).  Talk to your health care provider about getting a sleep evaluation if you snore a lot or have excessive sleepiness.  Take medicines only as directed by your health care provider.  For some people, aspirin or blood thinners (anticoagulants) are helpful in reducing the risk of forming abnormal blood clots that can lead to stroke. If you have the irregular heart rhythm of atrial fibrillation, you should be on a blood thinner unless there is a good reason you cannot take them.  Understand all your medicine instructions.  Make sure that other conditions (such as anemia or atherosclerosis) are addressed. SEEK IMMEDIATE MEDICAL CARE IF:   You have sudden weakness or numbness of the face, arm, or leg, especially on one side of the body.  Your face or eyelid droops to one side.  You have sudden confusion.  You have trouble speaking (aphasia) or understanding.  You have sudden trouble seeing in one or both eyes.  You have sudden trouble walking.  You have dizziness.  You have a loss of balance or coordination.  You have a sudden, severe headache with no known cause.  You have new chest pain or an irregular heartbeat. Any of these symptoms may represent a serious problem that is an emergency. Do not wait to see if the symptoms will   go away. Get medical help at once. Call your local emergency services (911 in [place] Do not drive yourself to the hospital.   This information is not intended to replace advice given to you by your health care provider. Make sure you discuss any questions you have with your health care provider.   Document Released: 09/19/2004 Document Revised: 09/02/2014 Document Reviewed:  02/12/2013 Elsevier Interactive Patient Education 2016 Elsevier Inc.  
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
## 2015-10-30 DIAGNOSIS — M25611 Stiffness of right shoulder, not elsewhere classified: Secondary | ICD-10-CM | POA: Diagnosis not present

## 2015-10-30 DIAGNOSIS — M6281 Muscle weakness (generalized): Secondary | ICD-10-CM | POA: Diagnosis not present

## 2015-10-30 DIAGNOSIS — M25511 Pain in right shoulder: Secondary | ICD-10-CM | POA: Diagnosis not present

## 2015-10-31 ENCOUNTER — Other Ambulatory Visit: Payer: Self-pay | Admitting: Family Medicine

## 2015-11-01 DIAGNOSIS — M25511 Pain in right shoulder: Secondary | ICD-10-CM | POA: Diagnosis not present

## 2015-11-01 DIAGNOSIS — M6281 Muscle weakness (generalized): Secondary | ICD-10-CM | POA: Diagnosis not present

## 2015-11-01 DIAGNOSIS — M25611 Stiffness of right shoulder, not elsewhere classified: Secondary | ICD-10-CM | POA: Diagnosis not present

## 2015-11-06 DIAGNOSIS — M6281 Muscle weakness (generalized): Secondary | ICD-10-CM | POA: Diagnosis not present

## 2015-11-06 DIAGNOSIS — M25511 Pain in right shoulder: Secondary | ICD-10-CM | POA: Diagnosis not present

## 2015-11-06 DIAGNOSIS — M25611 Stiffness of right shoulder, not elsewhere classified: Secondary | ICD-10-CM | POA: Diagnosis not present

## 2015-11-08 DIAGNOSIS — M25511 Pain in right shoulder: Secondary | ICD-10-CM | POA: Diagnosis not present

## 2015-11-08 DIAGNOSIS — M25611 Stiffness of right shoulder, not elsewhere classified: Secondary | ICD-10-CM | POA: Diagnosis not present

## 2015-11-08 DIAGNOSIS — M6281 Muscle weakness (generalized): Secondary | ICD-10-CM | POA: Diagnosis not present

## 2015-11-13 DIAGNOSIS — M25611 Stiffness of right shoulder, not elsewhere classified: Secondary | ICD-10-CM | POA: Diagnosis not present

## 2015-11-13 DIAGNOSIS — M25511 Pain in right shoulder: Secondary | ICD-10-CM | POA: Diagnosis not present

## 2015-11-13 DIAGNOSIS — M6281 Muscle weakness (generalized): Secondary | ICD-10-CM | POA: Diagnosis not present

## 2015-11-15 DIAGNOSIS — M6281 Muscle weakness (generalized): Secondary | ICD-10-CM | POA: Diagnosis not present

## 2015-11-15 DIAGNOSIS — M25611 Stiffness of right shoulder, not elsewhere classified: Secondary | ICD-10-CM | POA: Diagnosis not present

## 2015-11-15 DIAGNOSIS — M25511 Pain in right shoulder: Secondary | ICD-10-CM | POA: Diagnosis not present

## 2015-11-22 DIAGNOSIS — M6281 Muscle weakness (generalized): Secondary | ICD-10-CM | POA: Diagnosis not present

## 2015-11-22 DIAGNOSIS — M25511 Pain in right shoulder: Secondary | ICD-10-CM | POA: Diagnosis not present

## 2015-11-22 DIAGNOSIS — M25611 Stiffness of right shoulder, not elsewhere classified: Secondary | ICD-10-CM | POA: Diagnosis not present

## 2015-11-24 DIAGNOSIS — M25511 Pain in right shoulder: Secondary | ICD-10-CM | POA: Diagnosis not present

## 2015-11-24 DIAGNOSIS — M25611 Stiffness of right shoulder, not elsewhere classified: Secondary | ICD-10-CM | POA: Diagnosis not present

## 2015-11-24 DIAGNOSIS — M6281 Muscle weakness (generalized): Secondary | ICD-10-CM | POA: Diagnosis not present

## 2015-11-27 ENCOUNTER — Other Ambulatory Visit: Payer: Self-pay | Admitting: *Deleted

## 2015-11-27 DIAGNOSIS — E876 Hypokalemia: Secondary | ICD-10-CM

## 2015-11-28 DIAGNOSIS — R509 Fever, unspecified: Secondary | ICD-10-CM | POA: Diagnosis not present

## 2015-11-28 DIAGNOSIS — J060 Acute laryngopharyngitis: Secondary | ICD-10-CM | POA: Diagnosis not present

## 2015-11-28 DIAGNOSIS — J029 Acute pharyngitis, unspecified: Secondary | ICD-10-CM | POA: Diagnosis not present

## 2015-11-28 DIAGNOSIS — J0141 Acute recurrent pansinusitis: Secondary | ICD-10-CM | POA: Diagnosis not present

## 2015-11-28 DIAGNOSIS — J40 Bronchitis, not specified as acute or chronic: Secondary | ICD-10-CM | POA: Diagnosis not present

## 2015-11-28 DIAGNOSIS — Z6833 Body mass index (BMI) 33.0-33.9, adult: Secondary | ICD-10-CM | POA: Diagnosis not present

## 2015-11-30 MED ORDER — POTASSIUM CHLORIDE CRYS ER 20 MEQ PO TBCR: 20.0000 meq | EXTENDED_RELEASE_TABLET | Freq: Two times a day (BID) | ORAL | Status: DC

## 2015-11-30 NOTE — Telephone Encounter (Signed)
RN staff - please call to schedule lab draw to check potassium level

## 2015-12-05 ENCOUNTER — Other Ambulatory Visit: Payer: Self-pay | Admitting: Family Medicine

## 2015-12-05 NOTE — Telephone Encounter (Signed)
Called rx into pharmacy. Deseree Blount, CMA  

## 2015-12-05 NOTE — Telephone Encounter (Signed)
RN staff - please call in Tramadol 50 mg Q 6 hours prn pain, dispense #50, refill #0, thanks.  

## 2015-12-06 NOTE — Telephone Encounter (Signed)
Patient scheduled for 12/12/15. Theresa Brennan,CMA

## 2015-12-12 ENCOUNTER — Other Ambulatory Visit: Payer: Medicare Other

## 2015-12-14 ENCOUNTER — Encounter: Payer: Self-pay | Admitting: Family Medicine

## 2015-12-14 ENCOUNTER — Ambulatory Visit (INDEPENDENT_AMBULATORY_CARE_PROVIDER_SITE_OTHER): Payer: Medicare Other | Admitting: Family Medicine

## 2015-12-14 VITALS — BP 134/77 | HR 96 | Temp 98.1°F | Ht 60.0 in | Wt 170.5 lb

## 2015-12-14 DIAGNOSIS — E876 Hypokalemia: Secondary | ICD-10-CM | POA: Diagnosis not present

## 2015-12-14 DIAGNOSIS — I6521 Occlusion and stenosis of right carotid artery: Secondary | ICD-10-CM

## 2015-12-14 DIAGNOSIS — J019 Acute sinusitis, unspecified: Secondary | ICD-10-CM

## 2015-12-14 DIAGNOSIS — E162 Hypoglycemia, unspecified: Secondary | ICD-10-CM | POA: Diagnosis not present

## 2015-12-14 LAB — BASIC METABOLIC PANEL
BUN: 9 mg/dL (ref 7–25)
CHLORIDE: 98 mmol/L (ref 98–110)
CO2: 29 mmol/L (ref 20–31)
CREATININE: 0.69 mg/dL (ref 0.60–0.93)
Calcium: 8.9 mg/dL (ref 8.6–10.4)
GLUCOSE: 85 mg/dL (ref 65–99)
Potassium: 4 mmol/L (ref 3.5–5.3)
Sodium: 137 mmol/L (ref 135–146)

## 2015-12-14 MED ORDER — DOXYCYCLINE HYCLATE 100 MG PO TABS: 100.0000 mg | ORAL_TABLET | Freq: Two times a day (BID) | ORAL | Status: DC

## 2015-12-14 NOTE — Progress Notes (Signed)
Date of Visit: 12/14/2015   HPI:  Patient presents for a same day appointment to discuss URI symptoms.  Has been sick for 3 weeks total. Has lost her voice today. Two weeks ago she saw a doctor at an urgent care in [facility] and was prescribed a week of prednisone and an antibiotic, one pill daily for 7 days. Does not know the name of the antibiotic. Also given rx for tussin with codeine. Stopped taking this cough medication because it did not help. Did not get a chest xray. Endorses mild shortness of breath on occasion. Denies chest pain or worsening lower extremity edema (reports having chronic edema). Feels very congested in face. Coughing a lot. Has ear pain on R side. Does not usually have severe seasonal allergies.  Also describes ongoing episodes of possible hypoglycemia. Have gone on for several years, worsening in frequency. Happen presently about once per week. Feels shaky and like she's going to pass out. Improves with drinking something sugary. One time in the past when this occurred she was out of town and checked her sugar with a family member's meter and reports it was in the 20s-30s. No history of diabetes, not on any blood sugar lowering medications.   ROS: See HPI  PMFSH: history of chronic sinusitis, osteopenia, somatization disorder, GERD, IBS, arthritis, migraine, interstitial cystitis, hyperlipidemia, carotid stenosis s/p R CEA, hypertension, subclinical hypothyroidism, chronic diastolic CHF, chronic pain  PHYSICAL EXAM: BP 134/77 mmHg  Pulse 96  Temp(Src) 98.1 F (36.7 C) (Oral)  Ht 5' (1.524 m)  Wt 170 lb 8 oz (77.338 kg)  BMI 33.30 kg/m2  SpO2 98% Gen: NAD, pleasant, cooperative HEENT: normocephalic, atraumatic, moist mucous membranes, oropharynx clear and moist. L tympanic membrane normal. R tympanic membrane with chronic scarring, poorly visualized landmarks but no erythema or bulging. No anterior cervical or supraclavicular lymphadenopathy. Nares patent. + sinus  tenderness to palpation Heart: regular rate and rhythm, no murmur Lungs: clear to auscultation bilaterally, normal work of breathing, no crackles or wheezes Abdomen: soft, nontender to palpation, no masses or organomegaly Neuro: alert, grossly nonfocal, speech normal Extremities: 2+ edema bilateral lower extremities   ASSESSMENT/PLAN:  Hypoglycemia Episodes sound consistent with hypoglycemia, possibly related to irregular PO intake. Offered to rx glucometer to patient so she can check cbg's during these episodes but she declined, stating she does not want to prick her finger at home. She would like glucose checked today. Has future order in place for BMET, ordered by PCP to reassess K. Advised that BMET will assess glucose. Follow up with PCP if episodes continue or worsen.  Acute sinusitis 70 yo F presenting with 3 weeks of cough, sinus congestion, and laryngitis-type symptoms. Improved initially with antibiotics. I called patient's pharmacy and was notified that the antibiotic she took was azithromycin 500mg  for 7 days. Suspect this is incompletely treated sinusitis, warrants further antibiotic therapy. Patient with penicillin allergy so will rx doxycycline 100mg  twice daily for 7 days. Counseled that if this does not improve her symptoms she will need chest xray and follow up. No signs of pneumonia on exam today (lungs clear, well appearing, well hydrated, afebrile). Patient appreciative.    FOLLOW UP: Follow up as needed if symptoms worsen or fail to improve.    J. , MD [facility]

## 2015-12-14 NOTE — Assessment & Plan Note (Signed)
70 yo F presenting with 3 weeks of cough, sinus congestion, and laryngitis-type symptoms. Improved initially with antibiotics. I called patient's pharmacy and was notified that the antibiotic she took was azithromycin 500mg  for 7 days. Suspect this is incompletely treated sinusitis, warrants further antibiotic therapy. Patient with penicillin allergy so will rx doxycycline 100mg  twice daily for 7 days. Counseled that if this does not improve her symptoms she will need chest xray and follow up. No signs of pneumonia on exam today (lungs clear, well appearing, well hydrated, afebrile). Patient appreciative.

## 2015-12-14 NOTE — Patient Instructions (Signed)
Take doxycycline 100mg  twice daily to treat for sinus infection. This will also cover for lung-related issues.  If not improved in 5 days please call and we can order a chest xray  Be well, Dr. [place]   Sinusitis, Adult Sinusitis is redness, soreness, and inflammation of the paranasal sinuses. Paranasal sinuses are air pockets within the bones of your face. They are located beneath your eyes, in the middle of your forehead, and above your eyes. In healthy paranasal sinuses, mucus is able to drain out, and air is able to circulate through them by way of your nose. However, when your paranasal sinuses are inflamed, mucus and air can become trapped. This can allow bacteria and other germs to grow and cause infection. Sinusitis can develop quickly and last only a short time (acute) or continue over a long period (chronic). Sinusitis that lasts for more than 12 weeks is considered chronic. CAUSES Causes of sinusitis include:  Allergies.  Structural abnormalities, such as displacement of the cartilage that separates your nostrils (deviated septum), which can decrease the air flow through your nose and sinuses and affect sinus drainage.  Functional abnormalities, such as when the small hairs (cilia) that line your sinuses and help remove mucus do not work properly or are not present. SIGNS AND SYMPTOMS Symptoms of acute and chronic sinusitis are the same. The primary symptoms are pain and pressure around the affected sinuses. Other symptoms include:  Upper toothache.  Earache.  Headache.  Bad breath.  Decreased sense of smell and taste.  A cough, which worsens when you are lying flat.  Fatigue.  Fever.  Thick drainage from your nose, which often is green and may contain pus (purulent).  Swelling and warmth over the affected sinuses. DIAGNOSIS Your health care provider will perform a physical exam. During your exam, your health care provider may perform any of the following to  help determine if you have acute sinusitis or chronic sinusitis:  Look in your nose for signs of abnormal growths in your nostrils (nasal polyps).  Tap over the affected sinus to check for signs of infection.  View the inside of your sinuses using an imaging device that has a light attached (endoscope). If your health care provider suspects that you have chronic sinusitis, one or more of the following tests may be recommended:  Allergy tests.  Nasal culture. A sample of mucus is taken from your nose, sent to a lab, and screened for bacteria.  Nasal cytology. A sample of mucus is taken from your nose and examined by your health care provider to determine if your sinusitis is related to an allergy. TREATMENT Most cases of acute sinusitis are related to a viral infection and will resolve on their own within 10 days. Sometimes, medicines are prescribed to help relieve symptoms of both acute and chronic sinusitis. These may include pain medicines, decongestants, nasal steroid sprays, or saline sprays. However, for sinusitis related to a bacterial infection, your health care provider will prescribe antibiotic medicines. These are medicines that will help kill the bacteria causing the infection. Rarely, sinusitis is caused by a fungal infection. In these cases, your health care provider will prescribe antifungal medicine. For some cases of chronic sinusitis, surgery is needed. Generally, these are cases in which sinusitis recurs more than 3 times per year, despite other treatments. HOME CARE INSTRUCTIONS  Drink plenty of water. Water helps thin the mucus so your sinuses can drain more easily.  Use a humidifier.  Inhale steam 3-4  times a day (for example, sit in the bathroom with the shower running).  Apply a warm, moist washcloth to your face 3-4 times a day, or as directed by your health care provider.  Use saline nasal sprays to help moisten and clean your sinuses.  Take medicines only as  directed by your health care provider.  If you were prescribed either an antibiotic or antifungal medicine, finish it all even if you start to feel better. SEEK IMMEDIATE MEDICAL CARE IF:  You have increasing pain or severe headaches.  You have nausea, vomiting, or drowsiness.  You have swelling around your face.  You have vision problems.  You have a stiff neck.  You have difficulty breathing.   This information is not intended to replace advice given to you by your health care provider. Make sure you discuss any questions you have with your health care provider.   Document Released: 08/12/2005 Document Revised: 09/02/2014 Document Reviewed: 08/27/2011 Elsevier Interactive Patient Education [employer]

## 2015-12-14 NOTE — Assessment & Plan Note (Signed)
Episodes sound consistent with hypoglycemia, possibly related to irregular PO intake. Offered to rx glucometer to patient so she can check cbg's during these episodes but she declined, stating she does not want to prick her finger at home. She would like glucose checked today. Has future order in place for BMET, ordered by PCP to reassess K. Advised that BMET will assess glucose. Follow up with PCP if episodes continue or worsen.

## 2015-12-15 ENCOUNTER — Telehealth: Payer: Self-pay | Admitting: Family Medicine

## 2015-12-15 NOTE — Telephone Encounter (Signed)
Left message on voicemail that stated recent lab work was in the normal range.

## 2015-12-18 ENCOUNTER — Other Ambulatory Visit: Payer: Self-pay | Admitting: Family Medicine

## 2015-12-18 ENCOUNTER — Telehealth: Payer: Self-pay | Admitting: *Deleted

## 2015-12-18 NOTE — Telephone Encounter (Signed)
Patient called and states that the abx given to her are not helping.  She has been taking them for 4 days and she is still having a lot of sinus pain and facial pressure. Will forward to Dr. Pollie Meyer who saw patient for this concern.  Please advise. Jakorian Marengo,CMA

## 2015-12-19 NOTE — Telephone Encounter (Signed)
Returned call to patient. Thinks doxycycline is not helping. Feels the same as before. Is not on any allergy medications. Thinks she is immune to doxycycline due to being on it in the past for her bladder infections.  Recommend that patient come in for further evaluation. Would like CXR prior to visit but she can't get the CXR prior to coming  In to be seen. I have scheduled her an appointment with me tomorrow morning at 9:30am.  Latrelle Dodrill, MD

## 2015-12-20 ENCOUNTER — Ambulatory Visit
Admission: RE | Admit: 2015-12-20 | Discharge: 2015-12-20 | Disposition: A | Discharge status: Home / Self Care | Payer: Medicare Other | Source: Ambulatory Visit | Attending: Family Medicine | Admitting: Family Medicine

## 2015-12-20 ENCOUNTER — Encounter: Payer: Self-pay | Admitting: Family Medicine

## 2015-12-20 ENCOUNTER — Ambulatory Visit (HOSPITAL_COMMUNITY)
Admission: RE | Admit: 2015-12-20 | Discharge: 2015-12-20 | Disposition: A | Discharge status: Home / Self Care | Payer: Medicare Other | Source: Ambulatory Visit | Attending: Family Medicine | Admitting: Family Medicine

## 2015-12-20 ENCOUNTER — Ambulatory Visit (INDEPENDENT_AMBULATORY_CARE_PROVIDER_SITE_OTHER): Payer: Medicare Other | Admitting: Family Medicine

## 2015-12-20 ENCOUNTER — Other Ambulatory Visit: Payer: Self-pay

## 2015-12-20 VITALS — BP 142/60 | HR 91 | Temp 98.1°F | Wt 171.8 lb

## 2015-12-20 DIAGNOSIS — R05 Cough: Secondary | ICD-10-CM | POA: Diagnosis not present

## 2015-12-20 DIAGNOSIS — R9431 Abnormal electrocardiogram [ECG] [EKG]: Secondary | ICD-10-CM | POA: Diagnosis not present

## 2015-12-20 DIAGNOSIS — R059 Cough, unspecified: Secondary | ICD-10-CM

## 2015-12-20 DIAGNOSIS — R079 Chest pain, unspecified: Secondary | ICD-10-CM | POA: Insufficient documentation

## 2015-12-20 DIAGNOSIS — J0191 Acute recurrent sinusitis, unspecified: Secondary | ICD-10-CM | POA: Diagnosis not present

## 2015-12-20 DIAGNOSIS — I6521 Occlusion and stenosis of right carotid artery: Secondary | ICD-10-CM | POA: Diagnosis not present

## 2015-12-20 MED ORDER — CETIRIZINE HCL 10 MG PO TABS: 10.0000 mg | ORAL_TABLET | Freq: Every day | ORAL | Status: DC

## 2015-12-20 MED ORDER — FLUTICASONE PROPIONATE 50 MCG/ACT NA SUSP: 2.0000 | Freq: Every day | NASAL | Status: DC

## 2015-12-20 NOTE — Patient Instructions (Addendum)
Checking chest xray  Start zyrtec 10mg  daily Start flonase 2 sprays per nose daily Use neti pot and nasal saline spray  Get in with your ENT doctor ASAP since I'm limited in medications I can prescribe due to long QT interval on your EKG.  Be well, Dr. 

## 2015-12-20 NOTE — Progress Notes (Signed)
Date of Visit: 12/20/2015   HPI:  Patient presents for a same day appointment to discuss sinus symptoms.  Had had symptoms for 4 weeks total. Symptoms consist of cough, sore throat, facial pain, ear pain.  Was treated with course of azithromycin, which initially helped. Also with prednisone and cough medication. Never got back to normal even though had some improvement with those medications.  Then she worsened and came and saw me last week. I put her on doxycycline. Reports she has no improvement at all with this medication. Still has trouble with losing her voice and nose running.  Coughing a lot, nonproductive. Has had pain under her ribs for about 2 weeks. Feels congested in both her head and her chest. Nose is runny. Does report long history of allergies. Not using flonase, just sudafed without relief. Eating and drinking well. Stooling and urinating normally.  On ROS she endorses chest pain that occurred yesterday. It came & went and returned, lasted about 10-15 mins total. Did not radiate. No accompanying nausea. Mild accompanying shortness of breath and sweatiness.   ROS: See HPI  PMFSH: history of chronic sinusitis, osteopenia, somatization disorder, GERD, IBS, arthritis, migraine, interstitial cystitis, hyperlipidemia, carotid stenosis s/p R CEA, hypertension, subclinical hypothyroidism, chronic diastolic CHF, chronic pain  PHYSICAL EXAM: BP 142/60 mmHg  Pulse 91  Temp(Src) 98.1 F (36.7 C) (Oral)  Wt 171 lb 12.8 oz (77.928 kg) Gen: NAD, pleaasnt, cooperative HEENT: normocephalic, atraumatic, moist mucous membranes, tympanic membranes clear bilaterally, nares patent with some turbinate enlargement Heart: regular rate and rhythm, no murmur Lungs: clear to auscultation bilaterally, normal work of breathing, occasional cough Abdomen: soft nontender to palpation  Neuro: alert grossly nonfocal speech normal Extremities: 2+ edema bilteral lower extremities, chronic per patient    ASSESSMENT/PLAN:  Acute sinusitis Not improved with doxycycline. Continued symptoms despite two rounds of antibiotics raise concern for non-bacterial etiologies, most likely allergic. Discussed recommend for scheduling zyrtec and flonase with patient. She is convinced she has sinusitis and needs more antibiotic. Unfortunately there is not an additional antibiotic I can give her, as she is allergic to pcn and her EKG today shows mildly prolonged QT, making fluoroquinolones not a good option.  She is already established with an ENT physician. I had her contact them during today's visit and she was able to schedule an appointment for tomorrow 4/27. Will obtain CXR in the meantime, and start flonase and zyrtec. Patient agreeable to this plan.  CP likely is related to her copious coughing, not cardiac issue. EKG unchanged from prior except for QT (has prolonged Qt in past though). Discussed chest pain precautions withpt including when to seek emergency care.      FOLLOW UP: Follow up tomorrow with ENT for sinus issues  [place] J. Pollie Meyer, MD [facility]

## 2015-12-21 DIAGNOSIS — J040 Acute laryngitis: Secondary | ICD-10-CM | POA: Diagnosis not present

## 2015-12-21 DIAGNOSIS — H6121 Impacted cerumen, right ear: Secondary | ICD-10-CM | POA: Diagnosis not present

## 2015-12-21 DIAGNOSIS — H6061 Unspecified chronic otitis externa, right ear: Secondary | ICD-10-CM | POA: Diagnosis not present

## 2015-12-23 NOTE — Assessment & Plan Note (Signed)
Not improved with doxycycline. Continued symptoms despite two rounds of antibiotics raise concern for non-bacterial etiologies, most likely allergic. Discussed recommend for scheduling zyrtec and flonase with patient. She is convinced she has sinusitis and needs more antibiotic. Unfortunately there is not an additional antibiotic I can give her, as she is allergic to pcn and her EKG today shows mildly prolonged QT, making fluoroquinolones not a good option.  She is already established with an ENT physician. I had her contact them during today's visit and she was able to schedule an appointment for tomorrow 4/27. Will obtain CXR in the meantime, and start flonase and zyrtec. Patient agreeable to this plan.  CP likely is related to her copious coughing, not cardiac issue. EKG unchanged from prior except for QT (has prolonged Qt in past though). Discussed chest pain precautions withpt including when to seek emergency care.

## 2015-12-28 ENCOUNTER — Other Ambulatory Visit: Payer: Self-pay | Admitting: Family Medicine

## 2016-01-02 DIAGNOSIS — M67911 Unspecified disorder of synovium and tendon, right shoulder: Secondary | ICD-10-CM | POA: Diagnosis not present

## 2016-01-02 DIAGNOSIS — Z6833 Body mass index (BMI) 33.0-33.9, adult: Secondary | ICD-10-CM | POA: Diagnosis not present

## 2016-01-02 DIAGNOSIS — M1990 Unspecified osteoarthritis, unspecified site: Secondary | ICD-10-CM | POA: Diagnosis not present

## 2016-01-02 DIAGNOSIS — M75101 Unspecified rotator cuff tear or rupture of right shoulder, not specified as traumatic: Secondary | ICD-10-CM | POA: Diagnosis not present

## 2016-01-20 ENCOUNTER — Other Ambulatory Visit: Payer: Self-pay | Admitting: Family Medicine

## 2016-01-23 NOTE — Telephone Encounter (Signed)
RN staff - please call in Tramadol 50 mg Q 6 hours prn pain, dispense #50, refill #0, thanks.  

## 2016-01-23 NOTE — Telephone Encounter (Signed)
Called into pharmacy. Deseree Blount, CMA  

## 2016-01-24 ENCOUNTER — Other Ambulatory Visit: Payer: Self-pay | Admitting: Family Medicine

## 2016-01-26 NOTE — Telephone Encounter (Signed)
Called into pharmacy. Deseree Blount, CMA  

## 2016-01-26 NOTE — Telephone Encounter (Signed)
RN staff - please call in Flexeril 10 mg once daily prn muscle spasm, dispense #30, refill 0, thanks

## 2016-02-07 DIAGNOSIS — J370 Chronic laryngitis: Secondary | ICD-10-CM | POA: Diagnosis not present

## 2016-02-07 DIAGNOSIS — H6121 Impacted cerumen, right ear: Secondary | ICD-10-CM | POA: Diagnosis not present

## 2016-02-07 DIAGNOSIS — H7011 Chronic mastoiditis, right ear: Secondary | ICD-10-CM | POA: Diagnosis not present

## 2016-02-07 DIAGNOSIS — J322 Chronic ethmoidal sinusitis: Secondary | ICD-10-CM | POA: Diagnosis not present

## 2016-02-07 DIAGNOSIS — J320 Chronic maxillary sinusitis: Secondary | ICD-10-CM | POA: Diagnosis not present

## 2016-02-07 DIAGNOSIS — J40 Bronchitis, not specified as acute or chronic: Secondary | ICD-10-CM | POA: Diagnosis not present

## 2016-02-14 DIAGNOSIS — H7011 Chronic mastoiditis, right ear: Secondary | ICD-10-CM | POA: Diagnosis not present

## 2016-02-14 DIAGNOSIS — J040 Acute laryngitis: Secondary | ICD-10-CM | POA: Diagnosis not present

## 2016-02-14 DIAGNOSIS — J320 Chronic maxillary sinusitis: Secondary | ICD-10-CM | POA: Diagnosis not present

## 2016-02-14 DIAGNOSIS — J301 Allergic rhinitis due to pollen: Secondary | ICD-10-CM | POA: Diagnosis not present

## 2016-02-14 DIAGNOSIS — J322 Chronic ethmoidal sinusitis: Secondary | ICD-10-CM | POA: Diagnosis not present

## 2016-02-16 DIAGNOSIS — Z6833 Body mass index (BMI) 33.0-33.9, adult: Secondary | ICD-10-CM | POA: Diagnosis not present

## 2016-02-16 DIAGNOSIS — N390 Urinary tract infection, site not specified: Secondary | ICD-10-CM | POA: Diagnosis not present

## 2016-02-16 DIAGNOSIS — R350 Frequency of micturition: Secondary | ICD-10-CM | POA: Diagnosis not present

## 2016-02-16 DIAGNOSIS — N3289 Other specified disorders of bladder: Secondary | ICD-10-CM | POA: Diagnosis not present

## 2016-02-26 ENCOUNTER — Other Ambulatory Visit: Payer: Self-pay | Admitting: Family Medicine

## 2016-02-28 ENCOUNTER — Other Ambulatory Visit: Payer: Self-pay | Admitting: Family Medicine

## 2016-02-28 NOTE — Telephone Encounter (Signed)
Rx never received by pharmacy, rx called in as written. Patient informed.

## 2016-02-28 NOTE — Telephone Encounter (Signed)
Pt is calling because she said that [facility] didn't receive the prescription of Imitrex that we e-scribed on 02/26/16. Can we call and check on this. jw

## 2016-03-08 ENCOUNTER — Other Ambulatory Visit: Payer: Self-pay | Admitting: Family Medicine

## 2016-03-08 NOTE — Telephone Encounter (Signed)
Rx called into patient pharmacy. 

## 2016-03-08 NOTE — Telephone Encounter (Signed)
Nursing staff - please call in Tramadol 50 mg Q 6 hours prn pain, dispense #50, refill #0, thanks.

## 2016-03-15 ENCOUNTER — Other Ambulatory Visit: Payer: Self-pay | Admitting: Family Medicine

## 2016-03-26 ENCOUNTER — Other Ambulatory Visit: Payer: Self-pay | Admitting: Family Medicine

## 2016-03-29 ENCOUNTER — Other Ambulatory Visit: Payer: Self-pay | Admitting: Family Medicine

## 2016-04-22 DIAGNOSIS — J0100 Acute maxillary sinusitis, unspecified: Secondary | ICD-10-CM | POA: Diagnosis not present

## 2016-04-22 DIAGNOSIS — Z6833 Body mass index (BMI) 33.0-33.9, adult: Secondary | ICD-10-CM | POA: Diagnosis not present

## 2016-04-23 ENCOUNTER — Encounter: Payer: Self-pay | Admitting: Family

## 2016-04-25 ENCOUNTER — Other Ambulatory Visit: Payer: Self-pay | Admitting: Family Medicine

## 2016-05-01 ENCOUNTER — Other Ambulatory Visit: Payer: Self-pay | Admitting: *Deleted

## 2016-05-01 DIAGNOSIS — R319 Hematuria, unspecified: Secondary | ICD-10-CM | POA: Diagnosis not present

## 2016-05-01 DIAGNOSIS — I6523 Occlusion and stenosis of bilateral carotid arteries: Secondary | ICD-10-CM

## 2016-05-01 DIAGNOSIS — Z48812 Encounter for surgical aftercare following surgery on the circulatory system: Secondary | ICD-10-CM

## 2016-05-01 DIAGNOSIS — Z6833 Body mass index (BMI) 33.0-33.9, adult: Secondary | ICD-10-CM | POA: Diagnosis not present

## 2016-05-01 DIAGNOSIS — N390 Urinary tract infection, site not specified: Secondary | ICD-10-CM | POA: Diagnosis not present

## 2016-05-02 ENCOUNTER — Encounter (HOSPITAL_COMMUNITY): Payer: Medicare Other

## 2016-05-02 ENCOUNTER — Ambulatory Visit: Payer: Medicare Other | Admitting: Family

## 2016-05-03 ENCOUNTER — Encounter: Payer: Self-pay | Admitting: Family

## 2016-05-10 ENCOUNTER — Ambulatory Visit: Payer: Medicare Other | Admitting: Family

## 2016-05-10 ENCOUNTER — Inpatient Hospital Stay (HOSPITAL_COMMUNITY): Admission: RE | Admit: 2016-05-10 | Payer: Medicare Other | Source: Ambulatory Visit

## 2016-05-12 ENCOUNTER — Other Ambulatory Visit: Payer: Self-pay | Admitting: Family Medicine

## 2016-05-13 NOTE — Telephone Encounter (Signed)
Medication called into pharmacy.  Patient is aware that this has been done. Theresa Brennan,CMA

## 2016-05-13 NOTE — Telephone Encounter (Signed)
RN Staff - please call in Tramadol 50 mg Q6 hours prn pain, dispense #50, no refills, thank you.

## 2016-05-20 DIAGNOSIS — M4726 Other spondylosis with radiculopathy, lumbar region: Secondary | ICD-10-CM | POA: Diagnosis not present

## 2016-05-20 DIAGNOSIS — M5136 Other intervertebral disc degeneration, lumbar region: Secondary | ICD-10-CM | POA: Diagnosis not present

## 2016-05-20 DIAGNOSIS — Z6833 Body mass index (BMI) 33.0-33.9, adult: Secondary | ICD-10-CM | POA: Diagnosis not present

## 2016-05-20 DIAGNOSIS — M5416 Radiculopathy, lumbar region: Secondary | ICD-10-CM | POA: Diagnosis not present

## 2016-05-24 ENCOUNTER — Other Ambulatory Visit: Payer: Self-pay | Admitting: Family Medicine

## 2016-06-05 DIAGNOSIS — M5136 Other intervertebral disc degeneration, lumbar region: Secondary | ICD-10-CM | POA: Diagnosis not present

## 2016-06-05 DIAGNOSIS — M5416 Radiculopathy, lumbar region: Secondary | ICD-10-CM | POA: Diagnosis not present

## 2016-06-12 ENCOUNTER — Other Ambulatory Visit: Payer: Self-pay | Admitting: Family Medicine

## 2016-06-17 IMAGING — CR DG CHEST 2V
2 series · 2 of 2 positions shown · non-contrast
Comparison: 01/02/2015.

CLINICAL DATA: Cough and hoarseness for 4 weeks.

EXAM:
CHEST  2 VIEW

[w chest pa]
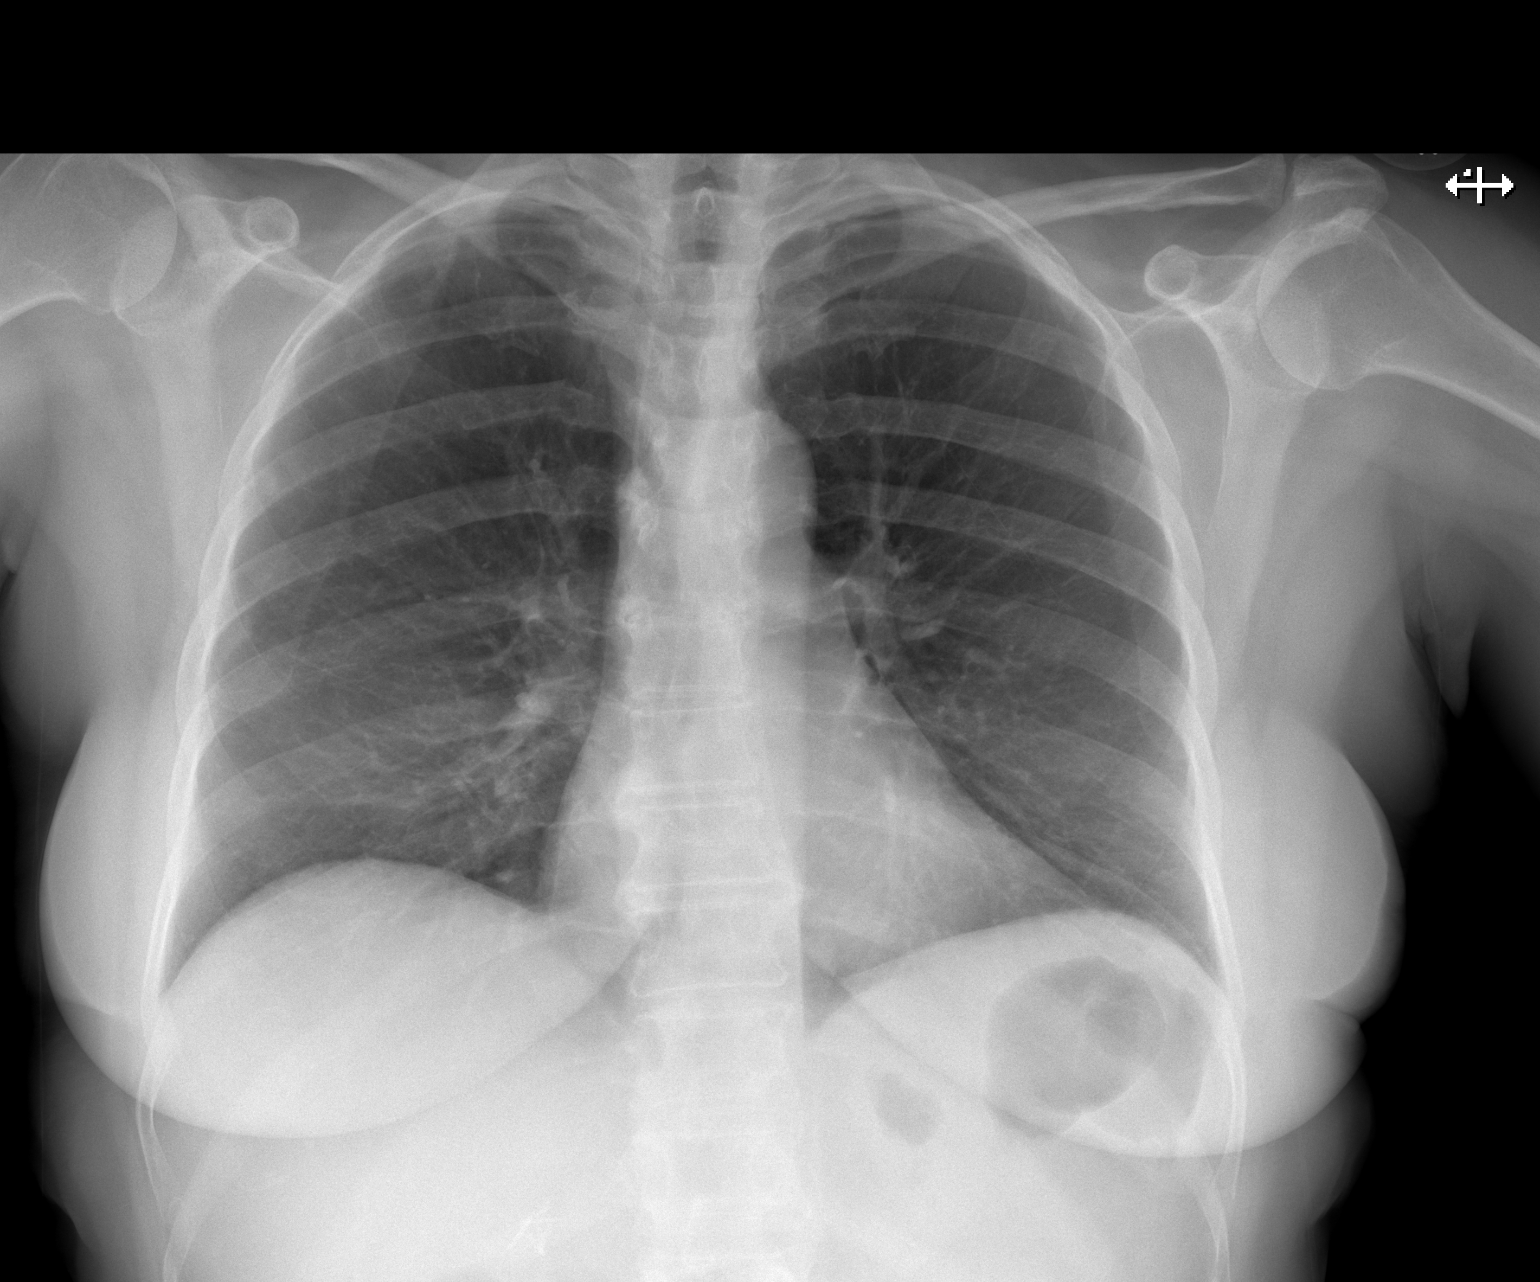

[w chest lat]
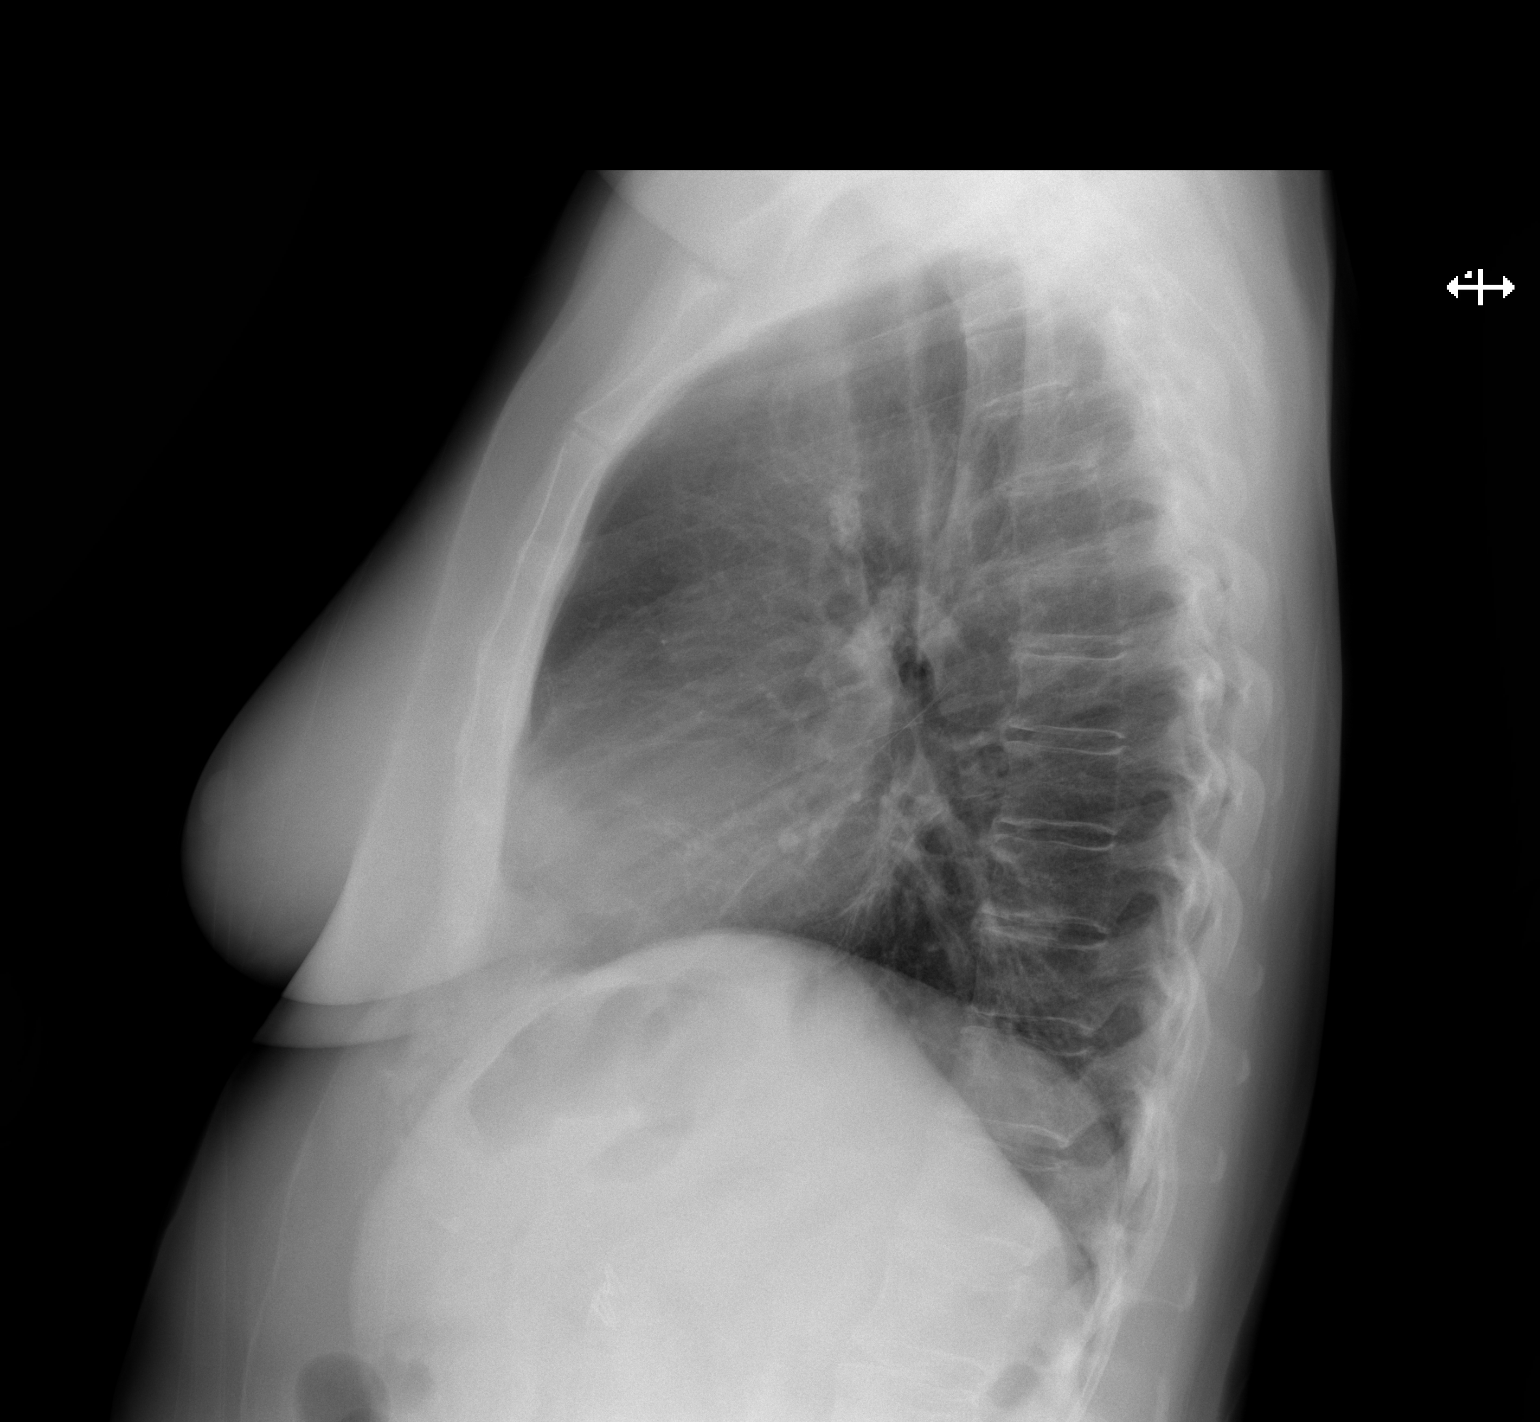

[2 of 2 positions shown; findings below may reference images not displayed]

FINDINGS: Trachea is midline. Heart size normal. Lungs are clear. No pleural
fluid.
IMPRESSION: No acute findings.

## 2016-06-20 DIAGNOSIS — Z6833 Body mass index (BMI) 33.0-33.9, adult: Secondary | ICD-10-CM | POA: Diagnosis not present

## 2016-06-20 DIAGNOSIS — M25511 Pain in right shoulder: Secondary | ICD-10-CM | POA: Diagnosis not present

## 2016-06-24 ENCOUNTER — Encounter: Payer: Self-pay | Admitting: Internal Medicine

## 2016-06-24 ENCOUNTER — Ambulatory Visit (INDEPENDENT_AMBULATORY_CARE_PROVIDER_SITE_OTHER): Payer: Medicare Other | Admitting: Internal Medicine

## 2016-06-24 VITALS — BP 171/86 | HR 86 | Temp 97.6°F | Wt 167.2 lb

## 2016-06-24 DIAGNOSIS — R05 Cough: Secondary | ICD-10-CM

## 2016-06-24 DIAGNOSIS — I6523 Occlusion and stenosis of bilateral carotid arteries: Secondary | ICD-10-CM | POA: Diagnosis not present

## 2016-06-24 DIAGNOSIS — R5381 Other malaise: Secondary | ICD-10-CM

## 2016-06-24 DIAGNOSIS — R059 Cough, unspecified: Secondary | ICD-10-CM

## 2016-06-24 MED ORDER — ALBUTEROL SULFATE HFA 108 (90 BASE) MCG/ACT IN AERS: 2.0000 | INHALATION_SPRAY | Freq: Four times a day (QID) | RESPIRATORY_TRACT | 0 refills | Status: AC | PRN

## 2016-06-24 MED ORDER — HYDROCODONE-HOMATROPINE 5-1.5 MG/5ML PO SYRP: 5.0000 mL | ORAL_SOLUTION | Freq: Four times a day (QID) | ORAL | 0 refills | Status: DC | PRN

## 2016-06-24 NOTE — Assessment & Plan Note (Signed)
Normal WOB on RA, able to speak in complete sentences. Lungs CTAB, without wheezes. Afebrile. Given clear lungs, more consistent with viral process rather than PNA. Flu included in differential, as patient with multiple symptoms consistent with flu and has not received a flu shot this year. Patient reporting chest tightness only with coughing, so cardiac etiology less likely as well.  - Hycodan cough syrup q6 PRN - Discussed at home methods for treating cough, such as honey, continuing to gargle with salt water (patient says it helps her) - Prescribed albuterol inhaler as patient reporting occasional wheezing - Return precautions given. Patient encouraged to call office or go to ED if difficulty breathing occurs.

## 2016-06-24 NOTE — Patient Instructions (Addendum)
It was nice meeting you today Theresa Brennan!  For your cough, you can begin taking the Hycodan cough syrup up to every 6 hours as needed. This may make you sleepy.  You can also eat a teaspoonful of honey, either plain or mixed into a warm beverage, a few times a day for cough and to soothe your throat. You can continue to gargle with salt water if you feel that it is improving your symptoms.  You can take Tylenol or ibuprofen as needed for body aches.  For wheezing, you can use the albuterol inhaler, 2 puffs every 4-6 hours as needed. You can wash your mouth out with water after using the inhaler to prevent the symptoms of jitteriness and fast heart rate.  If you begin having trouble breathing, please call the clinic or go to the emergency room.   If you are not feeling any better in a week, please call to schedule another appointment.   If you have any questions or concerns, please feel free to call the clinic.   Be well,  Dr. Natale Milch

## 2016-06-24 NOTE — Progress Notes (Signed)
   Subjective:    Patient ID: Theresa Brennan, female    DOB: Feb 17, 1946, 70 y.o.   MRN: [id number]  HPI  Patient presents for same day appointment for cough.   Cough Began 2-3 days ago. Non-productive. Some wheezing, and some trouble breathing when coughing. Also endorses body aches, sore throat, headache, and chest tightness. Denies fevers. Endorses some sweats. One episode of diarrhea yesterday, but no episodes of vomiting. Has been drinking a lot of water and eating soup daily. Patient has been using Chloraseptic lozenges and taking ibuprofen with minimal symptomatic relief. Patient has not received flu shot this year. Patient was around her great grandson who has been sick recently, and also went to a visit a friend in the hospital recently.   Smoking status reviewed. Patient is former smoker.  Review of Systems See HPI.     Objective:   Physical Exam  Constitutional: She is oriented to person, place, and time. She appears well-developed and well-nourished. No distress.  HENT:  Head: Normocephalic and atraumatic.  Nose: Nose normal.  Mouth/Throat: Oropharynx is clear and moist. No oropharyngeal exudate.  Eyes: Conjunctivae are normal. Right eye exhibits no discharge. Left eye exhibits no discharge.  Cardiovascular: Normal rate, regular rhythm and normal heart sounds.   No murmur heard. Pulmonary/Chest: Effort normal and breath sounds normal. No respiratory distress. She has no wheezes. She has no rales.  Neurological: She is alert and oriented to person, place, and time.  Psychiatric: She has a normal mood and affect. Her behavior is normal.      Assessment & Plan:  Cough Normal WOB on RA, able to speak in complete sentences. Lungs CTAB, without wheezes. Afebrile. Given clear lungs, more consistent with viral process rather than PNA. Flu included in differential, as patient with multiple symptoms consistent with flu and has not received a flu shot this year. Patient reporting chest  tightness only with coughing, so cardiac etiology less likely as well.  - Hycodan cough syrup q6 PRN - Discussed at home methods for treating cough, such as honey, continuing to gargle with salt water (patient says it helps her) - Prescribed albuterol inhaler as patient reporting occasional wheezing - Return precautions given. Patient encouraged to call office or go to ED if difficulty breathing occurs.   Tarri Abernethy, MD, MPH PGY-2 [facility] Pager [phone]

## 2016-06-25 ENCOUNTER — Other Ambulatory Visit: Payer: Self-pay | Admitting: Family Medicine

## 2016-06-25 LAB — INFLUENZA A AND B AG, IMMUNOASSAY
Influenza A Antigen: NOT DETECTED
Influenza B Antigen: NOT DETECTED

## 2016-06-25 NOTE — Telephone Encounter (Signed)
Nursing staff - please call in Tramadol 50 mg Q 6 hours prn pain, dispense #50, refill #0, thanks.  

## 2016-06-26 ENCOUNTER — Other Ambulatory Visit: Payer: Self-pay | Admitting: Family Medicine

## 2016-06-26 NOTE — Telephone Encounter (Signed)
Medication left on pharmacy voicemail today. LM for patient that script was called into pharmacy. Arlis Yale,CMA

## 2016-06-26 NOTE — Telephone Encounter (Signed)
[facility] Please advise. Thanks! ep

## 2016-07-26 ENCOUNTER — Other Ambulatory Visit: Payer: Self-pay | Admitting: Family Medicine

## 2016-07-30 ENCOUNTER — Other Ambulatory Visit: Payer: Self-pay | Admitting: Family Medicine

## 2016-07-30 ENCOUNTER — Telehealth: Payer: Self-pay | Admitting: *Deleted

## 2016-07-30 ENCOUNTER — Telehealth: Payer: Self-pay | Admitting: Family Medicine

## 2016-07-30 MED ORDER — ATORVASTATIN CALCIUM 20 MG PO TABS: 20.0000 mg | ORAL_TABLET | Freq: Every day | ORAL | 2 refills | Status: DC

## 2016-07-30 MED ORDER — ATORVASTATIN CALCIUM 20 MG PO TABS: 40.0000 mg | ORAL_TABLET | Freq: Every day | ORAL | 2 refills | Status: DC

## 2016-07-30 NOTE — Telephone Encounter (Signed)
Will forward to PCP.  Appointment 08/30/15.  Please advise.  Clovis Pu, RN

## 2016-07-30 NOTE — Telephone Encounter (Signed)
Returned patient call. She reports pain in front of thighs that radiates down to legs. Has been taking 1/2 tablet (of 40 mg) for past week with minimal relief. Sent in 20 mg Lipitor. Will continue 20 mg for one more week and if symptoms do not improved will decrease to 10 mg daily.

## 2016-07-30 NOTE — Telephone Encounter (Signed)
Received fax from [facility] stating patient's insurance will only cover 1 tablet per day for atorvastatin.  Please advise.  Clovis Pu, RN

## 2016-07-30 NOTE — Telephone Encounter (Signed)
Pt is experiencing a lot of pain, pt believes it is due to cholesterol medication. Pt has lowered the dose herself to half a pill. Pt wants the medication changed or stopped. I offered to make an appointment with another provider, but pt only wants to see Dr. Randolm Idol. Please advise. Thanks! ep

## 2016-07-30 NOTE — Telephone Encounter (Signed)
Sent in new prescription for Lipitor 20 mg daily. Please notify the pharmacy that this is a new dose. Previous script today was for 2 tablets (of 20 mg) and this was in error.

## 2016-08-14 ENCOUNTER — Other Ambulatory Visit: Payer: Self-pay | Admitting: Family Medicine

## 2016-08-29 ENCOUNTER — Ambulatory Visit (INDEPENDENT_AMBULATORY_CARE_PROVIDER_SITE_OTHER): Payer: Medicare Other | Admitting: Family Medicine

## 2016-08-29 ENCOUNTER — Ambulatory Visit (HOSPITAL_COMMUNITY)
Admission: RE | Admit: 2016-08-29 | Discharge: 2016-08-29 | Disposition: A | Discharge status: Home / Self Care | Payer: Medicare Other | Source: Ambulatory Visit | Attending: Family Medicine | Admitting: Family Medicine

## 2016-08-29 ENCOUNTER — Encounter: Payer: Self-pay | Admitting: Family Medicine

## 2016-08-29 VITALS — BP 134/92 | HR 90 | Temp 98.2°F | Ht 60.0 in | Wt 172.0 lb

## 2016-08-29 DIAGNOSIS — M7989 Other specified soft tissue disorders: Secondary | ICD-10-CM

## 2016-08-29 DIAGNOSIS — R079 Chest pain, unspecified: Secondary | ICD-10-CM

## 2016-08-29 DIAGNOSIS — I10 Essential (primary) hypertension: Secondary | ICD-10-CM | POA: Diagnosis not present

## 2016-08-29 DIAGNOSIS — E785 Hyperlipidemia, unspecified: Secondary | ICD-10-CM | POA: Diagnosis not present

## 2016-08-29 DIAGNOSIS — J320 Chronic maxillary sinusitis: Secondary | ICD-10-CM | POA: Diagnosis not present

## 2016-08-29 MED ORDER — ROSUVASTATIN CALCIUM 10 MG PO TABS: 10.0000 mg | ORAL_TABLET | Freq: Every day | ORAL | 1 refills | Status: DC

## 2016-08-29 MED ORDER — AZITHROMYCIN 250 MG PO TABS: ORAL_TABLET | ORAL | 0 refills | Status: DC

## 2016-08-29 NOTE — Assessment & Plan Note (Addendum)
Patient has URI symptoms however given chronicity of symptoms and associated sinus tenderness will treat as sinusitis. Allergic to Augmentin. -Z pack provided -continue cough drops -continue Claritin

## 2016-08-29 NOTE — Assessment & Plan Note (Signed)
Left sided chest pain over past few days associated with cough. Lung exam unremarkable. No current chest pain. EKG unremarkable. -likely due to URI symptoms -return precautions given

## 2016-08-29 NOTE — Assessment & Plan Note (Signed)
Continued leg swelling that is multifactorial in nature. Likely causing leg pain. -continue Dyazide and compression stockings

## 2016-08-29 NOTE — Patient Instructions (Signed)
It was nice to see you today.  Cough/sinus issues - take Z pack as prescribed.  Chest pain - likely due to cough, if does not improve with antibiotics please return to office  Leg pain - I think this is related to the swelling in your legs, continue your compression stockings, I have also changed your cholesterol medication to Crestor.

## 2016-08-29 NOTE — Progress Notes (Signed)
   Subjective:    Patient ID: Theresa Brennan, female    DOB: [birth year], 71 y.o.   MRN: [id number]  HPI 71 y/o female presents with multiple acute complaints.  Leg pain Bilateral, below knee, present for a few years, constant throbbing sensation, worse with sitting, no upper leg or arm symptoms, worse on Lipitor and would like to be taking off, hx. Venous insufficiency (wears compression stockings)  URI symptoms Rhinorrhea, associated headache, cough with sputum production, sinus pressure and congestion, No fevers, present for the past few weeks.   Chest Pain Under left breast, no radiation, no pleuritic chest pain, present the past few days, lasts a few hours, no current symptoms. Thinks may be related to potassium and would like checked.   Hypertension Prescribed Dyazide, reports compliance  Social - nonsmoker  Review of Systems See above    Objective:   Physical Exam BP (!) 134/92   Pulse 90   Temp 98.2 F (36.8 C) (Oral)   Ht 5' (1.524 m)   Wt 172 lb (78 kg)   BMI 33.59 kg/m   Gen: pleasant female, NAD HEENT: normocephalic, PERRL, EOMI, rhinorrhea present, maxillary and frontal sinus tenderness present, MMM, enlarged left tonsil without erythema or exudate, no cervical adenopathy, no neck tenderness, right CEA scar healed well Cardiac: RRR, S1 and S2 present, no murmur Resp: CTAB, normal effort Ext: trace edema, tenderness to touch circumferentially around lower legs, no rash Skin: bruising/bleeding on right arm (related to dog scratch)  EKG - no acute ischemic changes noted     Assessment & Plan:  Sinusitis, chronic Patient has URI symptoms however given chronicity of symptoms and associated sinus tenderness will treat as sinusitis. Allergic to Augmentin. -Z pack provided -continue cough drops -continue Claritin  HYPERTENSION, BENIGN At JNC-8 goal -continue Dyazide -check BMP given history of low potassium.   Leg swelling Continued leg swelling that is  multifactorial in nature. Likely causing leg pain. -continue Dyazide and compression stockings  Chest pain Left sided chest pain over past few days associated with cough. Lung exam unremarkable. No current chest pain. EKG unremarkable. -likely due to URI symptoms -return precautions given  Hyperlipidemia Patient thinks Lipitor is contributing to leg pain. Requests change in medication. transitioned to Crestor

## 2016-08-29 NOTE — Assessment & Plan Note (Signed)
Patient thinks Lipitor is contributing to leg pain. Requests change in medication. transitioned to Crestor

## 2016-08-29 NOTE — Assessment & Plan Note (Addendum)
At JNC-8 goal -continue Dyazide -check BMP given history of low potassium.

## 2016-08-30 ENCOUNTER — Encounter: Payer: Self-pay | Admitting: Family Medicine

## 2016-08-30 LAB — BASIC METABOLIC PANEL WITH GFR
BUN: 11 mg/dL (ref 7–25)
CALCIUM: 8.8 mg/dL (ref 8.6–10.4)
CO2: 28 mmol/L (ref 20–31)
CREATININE: 0.63 mg/dL (ref 0.60–0.93)
Chloride: 97 mmol/L — ABNORMAL LOW (ref 98–110)
GFR, Est African American: >89 mL/min (ref >=60)
GFR, Est Non African American: >89 mL/min (ref >=60)
GLUCOSE: 67 mg/dL (ref 65–99)
Potassium: 4.8 mmol/L (ref 3.5–5.3)
Sodium: 136 mmol/L (ref 135–146)

## 2016-09-02 ENCOUNTER — Telehealth: Payer: Self-pay | Admitting: Family Medicine

## 2016-09-02 NOTE — Telephone Encounter (Signed)
Pt is calling because her Crestor. She went to pick this up and it was over 500.00. She can not afford that at all. Id there anything else that can be called in that is similar to this. jw

## 2016-09-03 MED ORDER — LEVOFLOXACIN 500 MG PO TABS: 500.0000 mg | ORAL_TABLET | Freq: Every day | ORAL | 0 refills | Status: DC

## 2016-09-03 NOTE — Telephone Encounter (Signed)
Returned patient call. She was able to get Generic for $12.   Completed Z-pack and still has coughing and some shortness of breath. Taking cough medication with codeine which helps some.

## 2016-09-03 NOTE — Telephone Encounter (Signed)
Patient requests another antibiotic. Will try levaquin 500 mg for 7 days.

## 2016-09-17 ENCOUNTER — Other Ambulatory Visit: Payer: Self-pay | Admitting: Family Medicine

## 2016-09-17 ENCOUNTER — Encounter (HOSPITAL_BASED_OUTPATIENT_CLINIC_OR_DEPARTMENT_OTHER): Payer: Self-pay

## 2016-09-17 DIAGNOSIS — N3000 Acute cystitis without hematuria: Secondary | ICD-10-CM | POA: Insufficient documentation

## 2016-09-17 DIAGNOSIS — Z79899 Other long term (current) drug therapy: Secondary | ICD-10-CM | POA: Diagnosis not present

## 2016-09-17 DIAGNOSIS — E039 Hypothyroidism, unspecified: Secondary | ICD-10-CM | POA: Diagnosis not present

## 2016-09-17 DIAGNOSIS — I110 Hypertensive heart disease with heart failure: Secondary | ICD-10-CM | POA: Diagnosis not present

## 2016-09-17 DIAGNOSIS — Z7982 Long term (current) use of aspirin: Secondary | ICD-10-CM | POA: Insufficient documentation

## 2016-09-17 DIAGNOSIS — I509 Heart failure, unspecified: Secondary | ICD-10-CM | POA: Diagnosis not present

## 2016-09-17 DIAGNOSIS — R300 Dysuria: Secondary | ICD-10-CM | POA: Diagnosis present

## 2016-09-17 DIAGNOSIS — Z87891 Personal history of nicotine dependence: Secondary | ICD-10-CM | POA: Diagnosis not present

## 2016-09-17 LAB — URINALYSIS, MICROSCOPIC (REFLEX)

## 2016-09-17 LAB — URINALYSIS, ROUTINE W REFLEX MICROSCOPIC
Glucose, UA: NEGATIVE mg/dL
KETONES UR: NEGATIVE mg/dL
Nitrite: NEGATIVE
Protein, ur: NEGATIVE mg/dL
Specific Gravity, Urine: 1.024 (ref 1.005–1.030)
pH: 6 (ref 5.0–8.0)

## 2016-09-17 NOTE — ED Triage Notes (Signed)
C/o dysuria, urinary freq x today-NAD-steady gait

## 2016-09-18 ENCOUNTER — Emergency Department (HOSPITAL_BASED_OUTPATIENT_CLINIC_OR_DEPARTMENT_OTHER)
Admission: EM | Admit: 2016-09-18 | Discharge: 2016-09-18 | Disposition: A | Discharge status: Home / Self Care | Payer: Medicare Other | Attending: Emergency Medicine | Admitting: Emergency Medicine

## 2016-09-18 DIAGNOSIS — N3000 Acute cystitis without hematuria: Secondary | ICD-10-CM

## 2016-09-18 MED ORDER — PHENAZOPYRIDINE HCL 100 MG PO TABS
200.0000 mg | ORAL_TABLET | Freq: Once | ORAL | Status: AC
  Administered 2016-09-18: 200 mg via ORAL

## 2016-09-18 MED ORDER — ONDANSETRON 4 MG PO TBDP
4.0000 mg | ORAL_TABLET | Freq: Once | ORAL | Status: AC
  Administered 2016-09-18: 4 mg via ORAL

## 2016-09-18 MED ORDER — ONDANSETRON 4 MG PO TBDP
ORAL_TABLET | ORAL | Status: AC
  Filled 2016-09-18: qty 1

## 2016-09-18 MED ORDER — CEPHALEXIN 250 MG PO CAPS
ORAL_CAPSULE | ORAL | Status: DC
Start: 2016-09-18 — End: 2016-09-18
  Filled 2016-09-18: qty 2

## 2016-09-18 MED ORDER — PHENAZOPYRIDINE HCL 100 MG PO TABS
ORAL_TABLET | ORAL | Status: AC
  Filled 2016-09-18: qty 2

## 2016-09-18 MED ORDER — CEPHALEXIN 500 MG PO CAPS: 500.0000 mg | ORAL_CAPSULE | Freq: Three times a day (TID) | ORAL | 0 refills | Status: AC

## 2016-09-18 MED ORDER — NITROFURANTOIN MONOHYD MACRO 100 MG PO CAPS
100.0000 mg | ORAL_CAPSULE | Freq: Once | ORAL | Status: AC
  Administered 2016-09-18: 100 mg via ORAL
  Filled 2016-09-18: qty 1

## 2016-09-18 MED ORDER — PHENAZOPYRIDINE HCL 200 MG PO TABS: 200.0000 mg | ORAL_TABLET | Freq: Three times a day (TID) | ORAL | 0 refills | Status: DC

## 2016-09-18 NOTE — Telephone Encounter (Signed)
2nd request.  Martin, Theresa L, RN  

## 2016-09-18 NOTE — Discharge Instructions (Signed)
Please read and follow all provided instructions.  Your diagnoses today include:  1. Acute cystitis without hematuria    Tests performed today include: Urine test - suggests that you have an infection in your bladder Vital signs. See below for your results today.   Medications prescribed:  Keflex  Home care instructions:  Follow any educational materials contained in this packet.  Follow-up instructions: Please follow-up with your primary care provider in 3 days if symptoms are not resolved for further evaluation of your symptoms.  Return instructions:  Please return to the Emergency Department if you experience worsening symptoms.  Return with fever, worsening pain, persistent vomiting, worsening pain in your back.  Please return if you have any other emergent concerns.  Additional Information:  Your vital signs today were: BP 159/80 (BP Location: Left Arm)    Pulse 105    Temp 97.9 F (36.6 C) (Oral)    Resp 18    SpO2 98%  If your blood pressure (BP) was elevated above 135/85 this visit, please have this repeated by your doctor within one month. --------------
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
## 2016-09-20 LAB — URINE CULTURE

## 2016-09-21 ENCOUNTER — Telehealth: Payer: Self-pay

## 2016-09-21 NOTE — Telephone Encounter (Signed)
Post ED Visit - Positive Culture Follow-up  Culture report reviewed by antimicrobial stewardship pharmacist:  []  , Pharm.D. []  Enzo Bi, Pharm.D., BCPS [x]  , Pharm.D. []  Celedonio Miyamoto, Pharm.D., BCPS []  [place], Garvin Fila.D., BCPS, AAHIVP []  , Pharm.D., BCPS, AAHIVP []  Georgina Pillion, Pharm.D. []  , [place].D.  Positive urine culture Treated with Cephalexin, organism sensitive to the same and no further patient follow-up is required at this time.  [place] 09/21/2016, 9:37 AM

## 2016-09-26 ENCOUNTER — Other Ambulatory Visit: Payer: Self-pay | Admitting: Family Medicine

## 2016-09-27 NOTE — Telephone Encounter (Signed)
Rx called into patient pharmacy. 

## 2016-09-27 NOTE — Telephone Encounter (Signed)
RN staff - please call in Tramadol 50 mg Q 6 hours prn pain, dispense #50, refill #0, thanks.  

## 2016-10-01 ENCOUNTER — Encounter: Payer: Self-pay | Admitting: Cardiology

## 2016-10-02 ENCOUNTER — Telehealth: Payer: Self-pay | Admitting: *Deleted

## 2016-10-02 DIAGNOSIS — N390 Urinary tract infection, site not specified: Secondary | ICD-10-CM | POA: Diagnosis not present

## 2016-10-02 DIAGNOSIS — Z6832 Body mass index (BMI) 32.0-32.9, adult: Secondary | ICD-10-CM | POA: Diagnosis not present

## 2016-10-02 NOTE — Telephone Encounter (Signed)
Pt calls stating that she is having burning when she urinates along with frequency and low back pain.  She is requesting that an abx be called in for her w/o being seen because "it is raining, I can't see and I dont want to get the flu from coming in there".  Explained to pt that we do not give out abx w/o visits because we need to be sure that the abx we are giving will take care of the bacteria.  Advised that we did not have any openings today before I could offer her an appt in the am, she interrupted me and said that she "guesses I'll go to urgent care but can you check with Dr. Randolm Idol?"  Spoke with Dr. Randolm Idol, he agrees with the above.  Attempted to call pt back but no answer and no machine. Anu Stagner, Maryjo [place], CMA

## 2016-10-03 ENCOUNTER — Encounter: Payer: Self-pay | Admitting: Family

## 2016-10-10 ENCOUNTER — Ambulatory Visit: Payer: Medicare Other | Admitting: Family

## 2016-10-10 ENCOUNTER — Ambulatory Visit (HOSPITAL_COMMUNITY): Payer: Medicare Other

## 2016-10-22 ENCOUNTER — Encounter: Payer: Self-pay | Admitting: Family Medicine

## 2016-10-22 NOTE — Progress Notes (Signed)
   Subjective:    Patient ID: Theresa Brennan, female    DOB: February 01, 1946, 71 y.o.   MRN: [id number]  HPI 71 y/o female presents for routine follow up.  Leg pain/venous insufficiency/HLD Patient transitioned from Lipitor to Crestor at last visit to see if this helped leg pain. Still reports continued pain. Does have some foot cramping (mostly at night). Flexeril does help symptoms.   "My sugars have been going low" 4 times since Christmas, shaky, feels like passing out, symptoms improve with eating "reeses cup", last episode was 6 days ago (did eat a banana that morning, maybe some cereal).    Not a big eater. Breakfast - little cereal or cheese toast, only eats one meal per day as not hungry  Migraines Having more frequently (headaches almost daily), Imitrex improves symptoms  "I think I may have had a stroke" Reports continued hoarse voice after CEA 01/2015, denies other focal deficits  Review of Systems  Constitutional: Negative for chills and fever.  Respiratory: Negative for cough and shortness of breath.   Cardiovascular: Positive for leg swelling. Negative for chest pain.       Objective:   Physical Exam BP 130/80   Pulse 97   Temp 98.7 F (37.1 C) (Oral)   Ht 5' (1.524 m)   Wt 177 lb 3.2 oz (80.4 kg)   SpO2 95%   BMI 34.61 kg/m  Gen: pleasant female, NAD HEENT: normocephalic, PERRL, EOMI, no scleral icterus, MMM, uvuala midline, neck supple, scar over right anterior neck from CEA Cardiac: RRR, S1 and S2 present, no murmur Resp: CTAB, normal effort Ext: trace edema, tenderness to palpation of bilateral legs (all parts) Neuro: CN 2-12 intact, normal voice, strength 5/5 in all extremities, sensation to light touch intact in all extremities, negative pronator drift, able to perform finger to nose bilaterally  MRI 03/2014 - mild chronic small vessel changes, no acute abnormality     Assessment & Plan:  Migraine without aura Patient having increased frequency of  migraine. No focal deficits on exam. MRI from 2015 unremarkable.  -continue prn Imitrex -treat other chronic medical issues to see if this improves headaches -if continues to be an issue consider repeat MRI brain  Leg pain Chronic leg pain, likely multifactorial from swelling and chronic pain syndrome. Also has symptoms consistent with possible restless leg syndrome. -trial of Gabapentin 300 mg at night  Hypokalemia Resolved on most recent BMP.   Hypoglycemia Patient having symptoms consistent with possible hypoglycemia. Likely due to poor PO intake. -check A1C today -encouraged eating 3 meals per day  Carotid artery stenosis Intermittent hoarse voice per patient likely due to previous CEA (possible recurrent nerve injury). Normal exam today. -encouraged patient that likely not due to stroke.  -monitor symptoms

## 2016-10-24 ENCOUNTER — Encounter: Payer: Self-pay | Admitting: Family Medicine

## 2016-10-24 ENCOUNTER — Ambulatory Visit (INDEPENDENT_AMBULATORY_CARE_PROVIDER_SITE_OTHER): Payer: Medicare Other | Admitting: Family Medicine

## 2016-10-24 VITALS — BP 130/80 | HR 97 | Temp 98.7°F | Ht 60.0 in | Wt 177.2 lb

## 2016-10-24 DIAGNOSIS — E876 Hypokalemia: Secondary | ICD-10-CM

## 2016-10-24 DIAGNOSIS — M79605 Pain in left leg: Secondary | ICD-10-CM

## 2016-10-24 DIAGNOSIS — M79604 Pain in right leg: Secondary | ICD-10-CM

## 2016-10-24 DIAGNOSIS — G43009 Migraine without aura, not intractable, without status migrainosus: Secondary | ICD-10-CM

## 2016-10-24 DIAGNOSIS — I6521 Occlusion and stenosis of right carotid artery: Secondary | ICD-10-CM

## 2016-10-24 DIAGNOSIS — E162 Hypoglycemia, unspecified: Secondary | ICD-10-CM

## 2016-10-24 LAB — POCT GLYCOSYLATED HEMOGLOBIN (HGB A1C): HEMOGLOBIN A1C: 5.5

## 2016-10-24 MED ORDER — GABAPENTIN 300 MG PO CAPS: 300.0000 mg | ORAL_CAPSULE | Freq: Every day | ORAL | 1 refills | Status: DC

## 2016-10-24 NOTE — Patient Instructions (Addendum)
It was nice to see you today.  Please eat 3 times per day (even small meals) to help prevent low blood sugars. I will call you with your lab results.  Leg pain - continue Flexeril and Tramadol.   Please continue Imitrex for your migraines.  Schedule a follow up visit to discuss your memory.

## 2016-10-24 NOTE — Assessment & Plan Note (Signed)
Patient having increased frequency of migraine. No focal deficits on exam. MRI from 2015 unremarkable.  -continue prn Imitrex -treat other chronic medical issues to see if this improves headaches -if continues to be an issue consider repeat MRI brain

## 2016-10-24 NOTE — Assessment & Plan Note (Addendum)
Intermittent hoarse voice per patient likely due to previous CEA (possible recurrent nerve injury). Normal exam today. -encouraged patient that likely not due to stroke.  -monitor symptoms

## 2016-10-24 NOTE — Assessment & Plan Note (Signed)
Chronic leg pain, likely multifactorial from swelling and chronic pain syndrome. Also has symptoms consistent with possible restless leg syndrome. -trial of Gabapentin 300 mg at night

## 2016-10-24 NOTE — Assessment & Plan Note (Signed)
Resolved on most recent BMP.

## 2016-10-24 NOTE — Assessment & Plan Note (Signed)
Patient having symptoms consistent with possible hypoglycemia. Likely due to poor PO intake. -check A1C today -encouraged eating 3 meals per day

## 2016-10-25 ENCOUNTER — Telehealth: Payer: Self-pay | Admitting: Family Medicine

## 2016-10-25 NOTE — Telephone Encounter (Signed)
Attempted to call patient with A1C result, no answer, did not leave message.

## 2016-10-25 NOTE — Telephone Encounter (Signed)
Patient returned call. Discussed A1C of 5.5. Encouraged 3 meals per day, can substitute Ensure for one meal per day.   Patient has dizziness with Gabapentin last night, told her that this was a common side effect of this medication when first taking and should get better with time. Continue to take 300 mg QHS. If she is unable to tolerate after a few days can decrease dose to 100 mg QHS.

## 2016-10-30 DIAGNOSIS — N390 Urinary tract infection, site not specified: Secondary | ICD-10-CM | POA: Diagnosis not present

## 2016-10-30 DIAGNOSIS — R300 Dysuria: Secondary | ICD-10-CM | POA: Diagnosis not present

## 2016-10-30 DIAGNOSIS — Z6833 Body mass index (BMI) 33.0-33.9, adult: Secondary | ICD-10-CM | POA: Diagnosis not present

## 2016-11-06 DIAGNOSIS — R413 Other amnesia: Secondary | ICD-10-CM | POA: Insufficient documentation

## 2016-11-06 NOTE — Progress Notes (Signed)
   Subjective:    Patient ID: Theresa Brennan, female    DOB: [birth year], 71 y.o.   MRN: [id number]  HPI 71 y/o female presents to discuss memory loss. Accompanied by husband.   Memory Loss Has trouble remember medicines, can't spell things correctly anymore, forgets what people tell her; present for 3-4 years, thinks getting worse (thinks related to surgeries). Husband has noted that memory/concentration is poor.   Social Patient notes difficulty getting along with husband, often getting into arguments, this causes her to cry at time, associated depressed mood and anxiety at times. Evaluated further by Sammuel Hines CSW (please see her note).     Review of Systems     Objective:   Physical Exam BP 140/78   Pulse 92   Temp 98.2 F (36.8 C) (Oral)   Wt 168 lb (76.2 kg)   SpO2 94%   BMI 32.81 kg/m   Gen: pleasant female, NAD Cardiac: RRR, S1 and S2 present, no murmur Resp: CTAB, normal effort Neuro: CN 2-12 intact, strength 5/5 in all extremities, sensation to light touch intact in all extremities, negative pronator drift, negative Rhomberg, normal gait, able to perform finger to nose bilaterally without tremor.    TSH 04/2015 1.889 HIV 03/2010 nonreactive  MOCA Score 18/30 (completed high school), completed by MS3 Tonye Royalty  MRI Brain 03/2014  IMPRESSION: 1. No evidence of acute intracranial abnormality or mass. 2. Mild chronic small vessel ischemic disease     Assessment & Plan:  Memory loss Patient reports short term memory and concentration issues. MOCA score of 18/30 (will scan into chart). May be related to anxiety/behavioral health issues. MRI 2015 showed small vessel disease which may be consistent with vascular dementia.  -check TSH, B12, Folate, HIV, RPR  -if labs unremarkable will discuss trial of Aricept with patient.   Stress at home May be contributing to memory issues. See note by Sammuel Hines CSW.  - I am in favor of continued behavioral health  interventions.

## 2016-11-07 ENCOUNTER — Encounter: Payer: Self-pay | Admitting: Family Medicine

## 2016-11-07 ENCOUNTER — Ambulatory Visit (INDEPENDENT_AMBULATORY_CARE_PROVIDER_SITE_OTHER): Payer: Medicare Other | Admitting: Family Medicine

## 2016-11-07 ENCOUNTER — Encounter (INDEPENDENT_AMBULATORY_CARE_PROVIDER_SITE_OTHER): Payer: Medicare Other | Admitting: Licensed Clinical Social Worker

## 2016-11-07 VITALS — BP 140/78 | HR 92 | Temp 98.2°F | Wt 168.0 lb

## 2016-11-07 DIAGNOSIS — F439 Reaction to severe stress, unspecified: Secondary | ICD-10-CM | POA: Insufficient documentation

## 2016-11-07 DIAGNOSIS — Z114 Encounter for screening for human immunodeficiency virus [HIV]: Secondary | ICD-10-CM | POA: Diagnosis not present

## 2016-11-07 DIAGNOSIS — R413 Other amnesia: Secondary | ICD-10-CM

## 2016-11-07 DIAGNOSIS — I6521 Occlusion and stenosis of right carotid artery: Secondary | ICD-10-CM

## 2016-11-07 LAB — VITAMIN B12: Vitamin B-12: 360 pg/mL (ref 200–1100)

## 2016-11-07 LAB — FOLATE: FOLATE: 12.4 ng/mL (ref >5.4)

## 2016-11-07 LAB — TSH: TSH: 5.21 mIU/L — ABNORMAL HIGH

## 2016-11-07 NOTE — BH Specialist Note (Addendum)
Integrated Behavioral Health Initial Visit MRN: [id number] Name: Theresa Brennan Type of Service: Integrated Behavioral Health- Individual/Family Interpretor:No. Interpretor Name and Language: NA   Warm Hand Off Completed.     SUBJECTIVE: Theresa Brennan is a 71 y.o. female. Patient was referred by Dr. Randolm Idol for managing stress. Patient reports the following symptoms/concerns: getting angry with husband, forgetting things and not being able to think like she use to. Often forgets what she reads or forgets where her husband tells her he is going.  Duration of memory and stress with husband: 3 to 4 years, both continue to get worse in the past year; Severity of problem: moderate  OBJECTIVE: Mood: Anxious and Euthymic and Affect: Appropriate Risk of harm to self or others: No plan to harm self or others Previous therapy 30 years & 10 years ago, stopped going did not like the way the medication made her feel. Marriage counseling 2 months about 3 years ago but husband would not go with her.   LIFE CONTEXT: Family and Social: lives with her husband of 17 years, has two sons and grandchildren.  Patient continues to drive and does not have problems remembering where she is driving or getting lost while driving. 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  GOALS ADDRESSED: 1. Getting along with husband; 2. Managing things better with her memory Patient will reduce symptoms of: stress related to quarreling with her husband, and increase knowledge of: coping skills, self-management skills and stress reduction and also: Increase healthy adjustment to current life circumstances  INTERVENTIONS: Supportive Counseling  Standardized Assessments completed: not at this visit  ASSESSMENT: Patient currently experiencing stress. Patient may benefit from brief interventions to assist with managing stressors.  PLAN: 1. Follow up with behavioral health clinician in  one week. 2. Behavioral recommendations: schedule return visit with [facility] 3. Referral(s): Integrated Behavioral Health Services (In Clinic) 4. "From scale of 1-10, how likely are you to follow plan?": 9  Sammuel Hines, LCSW Licensed Clinical Social Worker [facility]   [phone] 2:38 PM Soundra Pilon, LCSW

## 2016-11-07 NOTE — Assessment & Plan Note (Signed)
Patient reports short term memory and concentration issues. MOCA score of 18/30 (will scan into chart). May be related to anxiety/behavioral health issues. MRI 2015 showed small vessel disease which may be consistent with vascular dementia.  -check TSH, B12, Folate, HIV, RPR  -if labs unremarkable will discuss trial of Aricept with patient.

## 2016-11-07 NOTE — Assessment & Plan Note (Signed)
May be contributing to memory issues. See note by Sammuel Hines CSW.  - I am in favor of continued behavioral health interventions.

## 2016-11-07 NOTE — Patient Instructions (Signed)
It was nice to speak to you today.  I will call you with the lab results.

## 2016-11-08 ENCOUNTER — Telehealth: Payer: Self-pay | Admitting: Family Medicine

## 2016-11-08 DIAGNOSIS — R413 Other amnesia: Secondary | ICD-10-CM

## 2016-11-08 DIAGNOSIS — E039 Hypothyroidism, unspecified: Secondary | ICD-10-CM

## 2016-11-08 DIAGNOSIS — E038 Other specified hypothyroidism: Secondary | ICD-10-CM

## 2016-11-08 LAB — RPR

## 2016-11-08 LAB — HIV ANTIBODY (ROUTINE TESTING W REFLEX): HIV: NONREACTIVE

## 2016-11-08 NOTE — Telephone Encounter (Signed)
Attempted to call patient with lab results, no answer, left message that I would call again.

## 2016-11-11 MED ORDER — LEVOTHYROXINE SODIUM 75 MCG PO TABS: 75.0000 ug | ORAL_TABLET | Freq: Every day | ORAL | 2 refills | Status: DC

## 2016-11-11 NOTE — Assessment & Plan Note (Signed)
TSH elevated so increased synthroid from 50 mcg to 75 mcg. Recheck TSH in 6-8 weeks.

## 2016-11-11 NOTE — Assessment & Plan Note (Signed)
Called patient with lab results. B12/folate/rpr/hiv negative. TSH elevated (history of subclinical hypothyroidism) so increased synthroid from 50 mcg to 75 mcg to see if this helps with memory. Recheck TSH in 6-8 weeks.

## 2016-11-11 NOTE — Telephone Encounter (Signed)
Called patient with lab results. B12/folate/rpr/hiv negative. TSH elevated so increased synthroid from 50 mcg to 75 mcg. Recheck TSH in 6-8 weeks.

## 2016-11-13 ENCOUNTER — Ambulatory Visit: Payer: Medicare Other

## 2016-11-13 ENCOUNTER — Other Ambulatory Visit: Payer: Self-pay | Admitting: Family Medicine

## 2016-11-13 NOTE — Telephone Encounter (Signed)
RN Staff - please call in Tramadol 50 mg Q6 hours prn pain, dispense #50, no refill. Thanks.

## 2016-11-14 ENCOUNTER — Telehealth: Payer: Self-pay | Admitting: Licensed Clinical Social Worker

## 2016-11-14 NOTE — Telephone Encounter (Signed)
Rx called into patient pharmacy. 

## 2016-11-14 NOTE — Progress Notes (Signed)
Patient was no show for [facility] appointment 11/14/16.  Follow up call to patient.  Left message to call LCSW or call [facility] office if she would like to reschedule.  Plan: LCSW will wait for patient to return call.   Sammuel Hines, LCSW Licensed Clinical Social Worker [facility]   [phone] 2:07 PM

## 2016-11-19 ENCOUNTER — Telehealth: Payer: Self-pay | Admitting: Licensed Clinical Social Worker

## 2016-11-19 ENCOUNTER — Encounter: Payer: Self-pay | Admitting: Family

## 2016-11-19 NOTE — Progress Notes (Signed)
[facility] F/U call to patient.   States she on antibiotics for an infection and is not feeling well.  Patient requested LCSW call back next week to reschedule [facility] appointment.  Plan: LCSW will call patient in 5 to 7 Business days.  Sammuel Hines, LCSW Licensed Clinical Social Worker [facility]   [phone] 3:14 PM

## 2016-11-28 ENCOUNTER — Encounter: Payer: Medicare Other | Admitting: Family

## 2016-11-28 ENCOUNTER — Telehealth: Payer: Self-pay | Admitting: Licensed Clinical Social Worker

## 2016-11-28 ENCOUNTER — Ambulatory Visit (HOSPITAL_COMMUNITY)
Admission: RE | Admit: 2016-11-28 | Discharge: 2016-11-28 | Disposition: A | Discharge status: Home / Self Care | Payer: Medicare Other | Source: Ambulatory Visit | Attending: Vascular Surgery | Admitting: Vascular Surgery

## 2016-11-28 DIAGNOSIS — Z48812 Encounter for surgical aftercare following surgery on the circulatory system: Secondary | ICD-10-CM | POA: Diagnosis not present

## 2016-11-28 DIAGNOSIS — I6523 Occlusion and stenosis of bilateral carotid arteries: Secondary | ICD-10-CM | POA: Diagnosis not present

## 2016-11-28 LAB — VAS US CAROTID
LCCAPDIAS: 33 cm/s
LEFT ECA DIAS: -15 cm/s
LICADDIAS: -30 cm/s
LICAPDIAS: 36 cm/s
Left CCA dist dias: -30 cm/s
Left CCA dist sys: -93 cm/s
Left CCA prox sys: 147 cm/s
Left ICA dist sys: -79 cm/s
Left ICA prox sys: 94 cm/s
RCCAPDIAS: 23 cm/s
RIGHT CCA MID DIAS: -21 cm/s
RIGHT ECA DIAS: -22 cm/s
Right CCA prox sys: 99 cm/s
Right cca dist sys: -123 cm/s

## 2016-11-28 NOTE — Progress Notes (Signed)
[facility] F/U call to patient.    Left message ref. scheduling a follow up visit with LCSW.  Plan: LCSW will wait for patient to return call or schedule [facility] F/U  Sammuel Hines, LCSW Licensed Clinical Social Worker [facility]   [phone] 2:21 PM

## 2016-12-09 ENCOUNTER — Other Ambulatory Visit: Payer: Self-pay | Admitting: Family Medicine

## 2016-12-10 ENCOUNTER — Encounter (HOSPITAL_BASED_OUTPATIENT_CLINIC_OR_DEPARTMENT_OTHER): Payer: Self-pay

## 2016-12-10 ENCOUNTER — Emergency Department (HOSPITAL_BASED_OUTPATIENT_CLINIC_OR_DEPARTMENT_OTHER)
Admission: EM | Admit: 2016-12-10 | Discharge: 2016-12-10 | Disposition: A | Discharge status: Home / Self Care | Payer: Medicare Other | Attending: Emergency Medicine | Admitting: Emergency Medicine

## 2016-12-10 DIAGNOSIS — Z7982 Long term (current) use of aspirin: Secondary | ICD-10-CM | POA: Insufficient documentation

## 2016-12-10 DIAGNOSIS — I130 Hypertensive heart and chronic kidney disease with heart failure and stage 1 through stage 4 chronic kidney disease, or unspecified chronic kidney disease: Secondary | ICD-10-CM | POA: Insufficient documentation

## 2016-12-10 DIAGNOSIS — Z79899 Other long term (current) drug therapy: Secondary | ICD-10-CM | POA: Diagnosis not present

## 2016-12-10 DIAGNOSIS — E039 Hypothyroidism, unspecified: Secondary | ICD-10-CM | POA: Diagnosis not present

## 2016-12-10 DIAGNOSIS — N3001 Acute cystitis with hematuria: Secondary | ICD-10-CM | POA: Diagnosis not present

## 2016-12-10 DIAGNOSIS — I5032 Chronic diastolic (congestive) heart failure: Secondary | ICD-10-CM | POA: Diagnosis not present

## 2016-12-10 DIAGNOSIS — N189 Chronic kidney disease, unspecified: Secondary | ICD-10-CM | POA: Insufficient documentation

## 2016-12-10 DIAGNOSIS — R350 Frequency of micturition: Secondary | ICD-10-CM | POA: Diagnosis present

## 2016-12-10 DIAGNOSIS — Z87891 Personal history of nicotine dependence: Secondary | ICD-10-CM | POA: Diagnosis not present

## 2016-12-10 LAB — URINALYSIS, ROUTINE W REFLEX MICROSCOPIC
BILIRUBIN URINE: NEGATIVE
GLUCOSE, UA: NEGATIVE mg/dL
KETONES UR: NEGATIVE mg/dL
NITRITE: NEGATIVE
PH: 6.5 (ref 5.0–8.0)
Protein, ur: 30 mg/dL — AB
SPECIFIC GRAVITY, URINE: 1.012 (ref 1.005–1.030)

## 2016-12-10 LAB — URINALYSIS, MICROSCOPIC (REFLEX)

## 2016-12-10 MED ORDER — PHENAZOPYRIDINE HCL 100 MG PO TABS: 95.0000 mg | ORAL_TABLET | Freq: Once | ORAL | Status: DC

## 2016-12-10 MED ORDER — CEPHALEXIN 500 MG PO CAPS: 500.0000 mg | ORAL_CAPSULE | Freq: Four times a day (QID) | ORAL | 0 refills | Status: DC

## 2016-12-10 MED ORDER — PHENAZOPYRIDINE HCL 200 MG PO TABS: 200.0000 mg | ORAL_TABLET | Freq: Three times a day (TID) | ORAL | 0 refills | Status: DC

## 2016-12-10 MED ORDER — CEPHALEXIN 250 MG PO CAPS
500.0000 mg | ORAL_CAPSULE | Freq: Once | ORAL | Status: AC
  Administered 2016-12-10: 500 mg via ORAL
  Filled 2016-12-10: qty 2

## 2016-12-10 MED ORDER — PHENAZOPYRIDINE HCL 100 MG PO TABS
200.0000 mg | ORAL_TABLET | Freq: Once | ORAL | Status: AC
  Administered 2016-12-10: 200 mg via ORAL
  Filled 2016-12-10: qty 2

## 2016-12-10 NOTE — Discharge Instructions (Signed)
Symptoms seem to be consistently urinary tract infection. Urine culture sent. Also understand he do a history of interstitial cystitis. Take the Keflex as directed for the next 7 days. Sounds as if you done well on that in the past. Take Pyridium as needed. Make an appointment to follow-up with your regular doctor. Return for any new or worse symptoms.

## 2016-12-10 NOTE — ED Triage Notes (Signed)
Pt c/o urinary freq x today-chronic hx of same-NAD-steady gait

## 2016-12-10 NOTE — ED Notes (Signed)
ED Provider at bedside. 
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
## 2016-12-12 NOTE — Patient Instructions (Signed)
Dear Theresa Brennan, Your recent vascular lab testing on 11-28-16 indicates:  No significant change noted when compared to the previous exam on 10-27-15. Please return in 18 months for follow up. Please contact our office at [phone] if you have any questions or concerns.  Thank you.    Stroke Prevention Some health problems and behaviors may make it more likely for you to have a stroke. Below are ways to lessen your risk of having a stroke.  Be active for at least 30 minutes on most or all days.  Do not smoke. Try not to be around others who smoke.  Do not drink too much alcohol.  Do not have more than 2 drinks a day if you are a man.  Do not have more than 1 drink a day if you are a woman and are not pregnant.  Eat healthy foods, such as fruits and vegetables. If you were put on a specific diet, follow the diet as told.  Keep your cholesterol levels under control through diet and medicines. Look for foods that are low in saturated fat, trans fat, cholesterol, and are high in fiber.  If you have diabetes, follow all diet plans and take your medicine as told.  Ask your doctor if you need treatment to lower your blood pressure. If you have high blood pressure (hypertension), follow all diet plans and take your medicine as told by your doctor.  If you are 72-76 years old, have your blood pressure checked every 3-5 years. If you are age 66 or older, have your blood pressure checked every year.  Keep a healthy weight. Eat foods that are low in calories, salt, saturated fat, trans fat, and cholesterol.  Do not take drugs.  Avoid birth control pills, if this applies. Talk to your doctor about the risks of taking birth control pills.  Talk to your doctor if you have sleep problems (sleep apnea).  Take all medicine as told by your doctor.  You may be told to take aspirin or blood thinner medicine. Take this medicine as told by your doctor.  Understand your medicine instructions.  Make  sure any other conditions you have are being taken care of. Get help right away if:  You suddenly lose feeling (you feel numb) or have weakness in your face, arm, or leg.  Your face or eyelid hangs down to one side.  You suddenly feel confused.  You have trouble talking (aphasia) or understanding what people are saying.  You suddenly have trouble seeing in one or both eyes.  You suddenly have trouble walking.  You are dizzy.  You lose your balance or your movements are clumsy (uncoordinated).  You suddenly have a very bad headache and you do not know the cause.  You have new chest pain.  Your heart feels like it is fluttering or skipping a beat (irregular heartbeat). Do not wait to see if the symptoms above go away. Get help right away. Call your local emergency services (911 in [place] Do not drive yourself to the hospital. This information is not intended to replace advice given to you by your health care provider. Make sure you discuss any questions you have with your health care provider. Document Released: 02/11/2012 Document Revised: 01/18/2016 Document Reviewed: 02/12/2013 Elsevier Interactive Patient Education  2017 [employer].

## 2016-12-13 LAB — URINE CULTURE: Culture: 50000 — AB

## 2016-12-14 ENCOUNTER — Telehealth: Payer: Self-pay

## 2016-12-14 NOTE — Telephone Encounter (Signed)
Post ED Visit - Positive Culture Follow-up  Culture report reviewed by antimicrobial stewardship pharmacist:  []  , Pharm.D. []  Enzo Bi, Pharm.D., BCPS AQ-ID []  , Pharm.D., BCPS []  Celedonio Miyamoto, .D., BCPS []  [place], .D., BCPS, AAHIVP []  Georgina Pillion, Pharm.D., BCPS, AAHIVP [x]  [address], PharmD, BCPS []  , PharmD, BCPS []  [place], PharmD, BCPS  Positive urine culture Treated with Cephalexin, organism sensitive to the same and no further patient follow-up is required at this time.  [address] 12/14/2016, 10:20 AM

## 2016-12-17 ENCOUNTER — Other Ambulatory Visit: Payer: Self-pay | Admitting: Family Medicine

## 2016-12-19 NOTE — Progress Notes (Signed)
Lab visit only. 

## 2016-12-28 ENCOUNTER — Encounter (HOSPITAL_BASED_OUTPATIENT_CLINIC_OR_DEPARTMENT_OTHER): Payer: Self-pay | Admitting: Emergency Medicine

## 2016-12-28 ENCOUNTER — Emergency Department (HOSPITAL_BASED_OUTPATIENT_CLINIC_OR_DEPARTMENT_OTHER)
Admission: EM | Admit: 2016-12-28 | Discharge: 2016-12-28 | Disposition: A | Discharge status: Home / Self Care | Payer: Medicare Other | Attending: Emergency Medicine | Admitting: Emergency Medicine

## 2016-12-28 DIAGNOSIS — E039 Hypothyroidism, unspecified: Secondary | ICD-10-CM | POA: Insufficient documentation

## 2016-12-28 DIAGNOSIS — Z87891 Personal history of nicotine dependence: Secondary | ICD-10-CM | POA: Diagnosis not present

## 2016-12-28 DIAGNOSIS — N3001 Acute cystitis with hematuria: Secondary | ICD-10-CM | POA: Diagnosis not present

## 2016-12-28 DIAGNOSIS — R300 Dysuria: Secondary | ICD-10-CM | POA: Diagnosis present

## 2016-12-28 DIAGNOSIS — Z7982 Long term (current) use of aspirin: Secondary | ICD-10-CM | POA: Insufficient documentation

## 2016-12-28 LAB — URINALYSIS, ROUTINE W REFLEX MICROSCOPIC
Bilirubin Urine: NEGATIVE
Glucose, UA: NEGATIVE mg/dL
Ketones, ur: NEGATIVE mg/dL
Nitrite: NEGATIVE
Protein, ur: NEGATIVE mg/dL
Specific Gravity, Urine: 1.005 (ref 1.005–1.030)
pH: 6.5 (ref 5.0–8.0)

## 2016-12-28 LAB — URINALYSIS, MICROSCOPIC (REFLEX): Squamous Epithelial / LPF: NONE SEEN

## 2016-12-28 MED ORDER — PHENAZOPYRIDINE HCL 100 MG PO TABS
ORAL_TABLET | ORAL | Status: AC
  Administered 2016-12-28: 100 mg
  Filled 2016-12-28: qty 1

## 2016-12-28 MED ORDER — PHENAZOPYRIDINE HCL 200 MG PO TABS: 200.0000 mg | ORAL_TABLET | Freq: Three times a day (TID) | ORAL | 0 refills | Status: DC

## 2016-12-28 MED ORDER — NITROFURANTOIN MONOHYD MACRO 100 MG PO CAPS: 100.0000 mg | ORAL_CAPSULE | Freq: Two times a day (BID) | ORAL | 0 refills | Status: DC

## 2016-12-28 MED ORDER — SULFAMETHOXAZOLE-TRIMETHOPRIM 800-160 MG PO TABS: 1.0000 | ORAL_TABLET | Freq: Two times a day (BID) | ORAL | 0 refills | Status: AC

## 2016-12-28 MED ORDER — SULFAMETHOXAZOLE-TRIMETHOPRIM 800-160 MG PO TABS
1.0000 | ORAL_TABLET | Freq: Once | ORAL | Status: DC
  Filled 2016-12-28: qty 1

## 2016-12-28 NOTE — ED Notes (Signed)
Pt discharged to home NAD.  

## 2016-12-28 NOTE — Discharge Instructions (Signed)
Follow-up with your urologist.  

## 2016-12-28 NOTE — ED Triage Notes (Addendum)
Dysuria and lower back pain today. Completed keflex x 2 weeks ago for UTI
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
## 2016-12-30 DIAGNOSIS — N3010 Interstitial cystitis (chronic) without hematuria: Secondary | ICD-10-CM | POA: Diagnosis not present

## 2016-12-30 DIAGNOSIS — R309 Painful micturition, unspecified: Secondary | ICD-10-CM | POA: Diagnosis not present

## 2016-12-30 LAB — URINE CULTURE: Culture: NO GROWTH

## 2016-12-31 ENCOUNTER — Other Ambulatory Visit: Payer: Self-pay | Admitting: Family Medicine

## 2016-12-31 NOTE — Telephone Encounter (Signed)
Needs refill on gabapentin. [place] and would like them called in to [facility] in [place]. Please let her know when this is done.  " she needs them desperately"

## 2017-01-01 ENCOUNTER — Other Ambulatory Visit: Payer: Self-pay | Admitting: Family Medicine

## 2017-01-01 MED ORDER — GABAPENTIN 300 MG PO CAPS: 300.0000 mg | ORAL_CAPSULE | Freq: Every day | ORAL | 1 refills | Status: DC

## 2017-01-01 NOTE — Telephone Encounter (Signed)
RN staff - please call in Tramadol 50 mg Q 8 hours prn pain, dispense #50, no refills.  

## 2017-01-02 NOTE — Telephone Encounter (Signed)
Rx called into patient pharmacy. 

## 2017-01-16 DIAGNOSIS — Z6832 Body mass index (BMI) 32.0-32.9, adult: Secondary | ICD-10-CM | POA: Diagnosis not present

## 2017-01-16 DIAGNOSIS — M4726 Other spondylosis with radiculopathy, lumbar region: Secondary | ICD-10-CM | POA: Diagnosis not present

## 2017-01-16 DIAGNOSIS — M5416 Radiculopathy, lumbar region: Secondary | ICD-10-CM | POA: Diagnosis not present

## 2017-01-16 DIAGNOSIS — M5136 Other intervertebral disc degeneration, lumbar region: Secondary | ICD-10-CM | POA: Diagnosis not present

## 2017-01-17 ENCOUNTER — Other Ambulatory Visit: Payer: Self-pay | Admitting: Family Medicine

## 2017-01-23 DIAGNOSIS — N390 Urinary tract infection, site not specified: Secondary | ICD-10-CM | POA: Diagnosis not present

## 2017-01-24 DIAGNOSIS — R300 Dysuria: Secondary | ICD-10-CM | POA: Diagnosis not present

## 2017-01-24 DIAGNOSIS — N3941 Urge incontinence: Secondary | ICD-10-CM | POA: Diagnosis not present

## 2017-02-02 ENCOUNTER — Emergency Department (HOSPITAL_COMMUNITY)
Admission: EM | Admit: 2017-02-02 | Discharge: 2017-02-02 | Disposition: A | Discharge status: Home / Self Care | Payer: Medicare Other | Attending: Emergency Medicine | Admitting: Emergency Medicine

## 2017-02-02 ENCOUNTER — Encounter (HOSPITAL_COMMUNITY): Payer: Self-pay | Admitting: *Deleted

## 2017-02-02 DIAGNOSIS — N3001 Acute cystitis with hematuria: Secondary | ICD-10-CM | POA: Insufficient documentation

## 2017-02-02 DIAGNOSIS — E039 Hypothyroidism, unspecified: Secondary | ICD-10-CM | POA: Diagnosis not present

## 2017-02-02 DIAGNOSIS — Z79899 Other long term (current) drug therapy: Secondary | ICD-10-CM | POA: Insufficient documentation

## 2017-02-02 DIAGNOSIS — I130 Hypertensive heart and chronic kidney disease with heart failure and stage 1 through stage 4 chronic kidney disease, or unspecified chronic kidney disease: Secondary | ICD-10-CM | POA: Diagnosis not present

## 2017-02-02 DIAGNOSIS — I5032 Chronic diastolic (congestive) heart failure: Secondary | ICD-10-CM | POA: Insufficient documentation

## 2017-02-02 DIAGNOSIS — Z7982 Long term (current) use of aspirin: Secondary | ICD-10-CM | POA: Insufficient documentation

## 2017-02-02 DIAGNOSIS — R319 Hematuria, unspecified: Secondary | ICD-10-CM | POA: Diagnosis present

## 2017-02-02 DIAGNOSIS — N189 Chronic kidney disease, unspecified: Secondary | ICD-10-CM | POA: Diagnosis not present

## 2017-02-02 DIAGNOSIS — Z87891 Personal history of nicotine dependence: Secondary | ICD-10-CM | POA: Diagnosis not present

## 2017-02-02 LAB — URINALYSIS, ROUTINE W REFLEX MICROSCOPIC
BILIRUBIN URINE: NEGATIVE
GLUCOSE, UA: NEGATIVE mg/dL
Ketones, ur: NEGATIVE mg/dL
NITRITE: POSITIVE — AB
PROTEIN: NEGATIVE mg/dL
Specific Gravity, Urine: 1.005 (ref 1.005–1.030)
pH: 6 (ref 5.0–8.0)

## 2017-02-02 LAB — COMPREHENSIVE METABOLIC PANEL
ALK PHOS: 78 U/L (ref 38–126)
ALT: 25 U/L (ref 14–54)
ANION GAP: 12 (ref 5–15)
AST: 30 U/L (ref 15–41)
Albumin: 3.8 g/dL (ref 3.5–5.0)
BILIRUBIN TOTAL: 0.7 mg/dL (ref 0.3–1.2)
BUN: 11 mg/dL (ref 6–20)
CALCIUM: 9 mg/dL (ref 8.9–10.3)
CO2: 26 mmol/L (ref 22–32)
CREATININE: 1.07 mg/dL — AB (ref 0.44–1.00)
Chloride: 99 mmol/L — ABNORMAL LOW (ref 101–111)
GFR, EST AFRICAN AMERICAN: 59 mL/min — AB (ref >60)
GFR, EST NON AFRICAN AMERICAN: 51 mL/min — AB (ref >60)
Glucose, Bld: 118 mg/dL — ABNORMAL HIGH (ref 65–99)
Potassium: 2.8 mmol/L — ABNORMAL LOW (ref 3.5–5.1)
SODIUM: 137 mmol/L (ref 135–145)
TOTAL PROTEIN: 7.3 g/dL (ref 6.5–8.1)

## 2017-02-02 LAB — CBC
HCT: 37.6 % (ref 36.0–46.0)
HEMOGLOBIN: 12 g/dL (ref 12.0–15.0)
MCH: 27.6 pg (ref 26.0–34.0)
MCHC: 31.9 g/dL (ref 30.0–36.0)
MCV: 86.4 fL (ref 78.0–100.0)
PLATELETS: 371 10*3/uL (ref 150–400)
RBC: 4.35 MIL/uL (ref 3.87–5.11)
RDW: 15.1 % (ref 11.5–15.5)
WBC: 9.4 10*3/uL (ref 4.0–10.5)

## 2017-02-02 LAB — LIPASE, BLOOD: Lipase: 21 U/L (ref 11–51)

## 2017-02-02 MED ORDER — ONDANSETRON 4 MG PO TBDP
4.0000 mg | ORAL_TABLET | Freq: Once | ORAL | Status: AC
  Administered 2017-02-02: 4 mg via ORAL
  Filled 2017-02-02: qty 1

## 2017-02-02 MED ORDER — LIDOCAINE HCL (PF) 1 % IJ SOLN
2.0000 mL | Freq: Once | INTRAMUSCULAR | Status: AC
  Administered 2017-02-02: 2 mL

## 2017-02-02 MED ORDER — PHENAZOPYRIDINE HCL 100 MG PO TABS
100.0000 mg | ORAL_TABLET | Freq: Once | ORAL | Status: AC
  Administered 2017-02-02: 100 mg via ORAL
  Filled 2017-02-02: qty 1

## 2017-02-02 MED ORDER — OXYCODONE HCL 5 MG PO TABS
5.0000 mg | ORAL_TABLET | Freq: Once | ORAL | Status: AC
  Administered 2017-02-02: 5 mg via ORAL
  Filled 2017-02-02: qty 1

## 2017-02-02 MED ORDER — CEFDINIR 300 MG PO CAPS: 300.0000 mg | ORAL_CAPSULE | Freq: Two times a day (BID) | ORAL | 0 refills | Status: DC

## 2017-02-02 MED ORDER — LIDOCAINE HCL (PF) 1 % IJ SOLN
INTRAMUSCULAR | Status: AC
  Filled 2017-02-02: qty 5

## 2017-02-02 MED ORDER — PHENAZOPYRIDINE HCL 200 MG PO TABS: 200.0000 mg | ORAL_TABLET | Freq: Three times a day (TID) | ORAL | 0 refills | Status: DC

## 2017-02-02 MED ORDER — CEFTRIAXONE SODIUM 1 G IJ SOLR
500.0000 mg | Freq: Once | INTRAMUSCULAR | Status: AC
  Administered 2017-02-02: 500 mg via INTRAMUSCULAR
  Filled 2017-02-02: qty 10

## 2017-02-02 NOTE — ED Triage Notes (Signed)
Pt c/o bladder spasms with bloody urine for 3 days  Numerus utis

## 2017-02-02 NOTE — ED Notes (Signed)
Spoke at length with patient about home care. Patient voiced concerns about infection coming back again. Asks "what do I do then?". Attempted to reassure patient. Patient states she has a GYN appointment scheduled in July as well. Encouraged patient to follow up closely with her urology team and ensure she get seen by GYN as they may have some further insight. Discussed hygiene practices and advised patient on strategies to prevent reinfection.
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
## 2017-02-04 DIAGNOSIS — M25511 Pain in right shoulder: Secondary | ICD-10-CM | POA: Diagnosis not present

## 2017-02-04 DIAGNOSIS — Z6832 Body mass index (BMI) 32.0-32.9, adult: Secondary | ICD-10-CM | POA: Diagnosis not present

## 2017-02-05 LAB — URINE CULTURE

## 2017-02-06 ENCOUNTER — Telehealth: Payer: Self-pay | Admitting: Emergency Medicine

## 2017-02-06 NOTE — Telephone Encounter (Signed)
Post ED Visit - Positive Culture Follow-up: Successful Patient Follow-Up  Culture assessed and recommendations reviewed by: []  , Pharm.D. []  Enzo Bi, Pharm.D., BCPS AQ-ID []  , Pharm.D., BCPS [x]  Celedonio Miyamoto, Pharm.D., BCPS []  Holley, Garvin Fila.D., BCPS, AAHIVP []  , Pharm.D., BCPS, AAHIVP []  Georgina Pillion, PharmD, BCPS []  , PharmD, BCPS []  [place], PharmD, BCPS  Positive urine culture  []  Patient discharged without antimicrobial prescription and treatment is now indicated [x]  Organism is resistant to prescribed ED discharge antimicrobial []  Patient with positive blood cultures  Changes discussed with ED provider: [address] [place] New antibiotic prescription symptom check, still + symptoms, stop Cefdinir, start Fosfomycin 3 Grams po x 1 dose  Called to [place] @ [facility]  02/06/17 1520   Lysle Pearl 02/06/2017, 3:23 PM

## 2017-02-06 NOTE — Progress Notes (Signed)
ED Antimicrobial Stewardship Positive Culture Follow Up   Theresa Brennan is an 71 y.o. female who presented to [facility] on 02/02/2017 with a chief complaint of  Chief Complaint  Patient presents with  . Hematuria    Recent Results (from the past 720 hour(s))  Urine Culture     Status: Abnormal   Collection Time: 02/02/17  7:48 PM  Result Value Ref Range Status   Specimen Description URINE, CLEAN CATCH  Final   Special Requests NONE  Final   Culture (A)  Final    40,000 COLONIES/mL KLEBSIELLA PNEUMONIAE Confirmed Extended Spectrum Beta-Lactamase Producer (ESBL)    Report Status 02/05/2017 FINAL  Final   Organism ID, Bacteria KLEBSIELLA PNEUMONIAE (A)  Final      Susceptibility   Klebsiella pneumoniae - MIC*    AMPICILLIN >=32 RESISTANT Resistant     CEFAZOLIN >=64 RESISTANT Resistant     CEFTRIAXONE >=64 RESISTANT Resistant     CIPROFLOXACIN 0.5 SENSITIVE Sensitive     GENTAMICIN <=1 SENSITIVE Sensitive     IMIPENEM <=0.25 SENSITIVE Sensitive     NITROFURANTOIN 64 INTERMEDIATE Intermediate     TRIMETH/SULFA >=320 RESISTANT Resistant     AMPICILLIN/SULBACTAM 16 INTERMEDIATE Intermediate     PIP/TAZO <=4 SENSITIVE Sensitive     Extended ESBL POSITIVE Resistant     * 40,000 COLONIES/mL KLEBSIELLA PNEUMONIAE    [x]  Treated with Cefdinir, organism resistant to prescribed antimicrobial []  Patient discharged originally without antimicrobial agent and treatment is now indicated  New antibiotic prescription: D/c Cefdinir. Call for symptom check - If symptomatic - give  Fosfomycin 3g po x 1 dose - If asymptomatic - no treatment needed  ED Provider: , PA-C  02/06/2017, 9:32 AM Infectious Diseases Pharmacist Phone# [phone]

## 2017-02-10 DIAGNOSIS — M25511 Pain in right shoulder: Secondary | ICD-10-CM | POA: Diagnosis not present

## 2017-02-12 ENCOUNTER — Other Ambulatory Visit: Payer: Self-pay | Admitting: Family Medicine

## 2017-02-12 DIAGNOSIS — M75101 Unspecified rotator cuff tear or rupture of right shoulder, not specified as traumatic: Secondary | ICD-10-CM | POA: Diagnosis not present

## 2017-02-12 DIAGNOSIS — Z6832 Body mass index (BMI) 32.0-32.9, adult: Secondary | ICD-10-CM | POA: Diagnosis not present

## 2017-02-13 ENCOUNTER — Other Ambulatory Visit: Payer: Self-pay | Admitting: Family Medicine

## 2017-02-13 DIAGNOSIS — E038 Other specified hypothyroidism: Secondary | ICD-10-CM

## 2017-02-13 DIAGNOSIS — E039 Hypothyroidism, unspecified: Secondary | ICD-10-CM

## 2017-02-13 NOTE — Telephone Encounter (Signed)
RN staff - please call in Tramadol 50 mg Q6 hours prn pain, dispense #50, no refill. Please notify patient of refill. Also inform her that I have refilled her Synthroid for one month. However, she needs a recheck of her TSH. Please have her schedule a lab visit for TSH (I will place order in an orders only encounter).

## 2017-02-16 ENCOUNTER — Other Ambulatory Visit: Payer: Self-pay | Admitting: Family Medicine

## 2017-02-18 NOTE — Telephone Encounter (Signed)
Rx called into to patient pharmacy, left message on voicemail that rx's had been sent to pharmacy and to call back to schedule a lab visit.

## 2017-02-24 DIAGNOSIS — N390 Urinary tract infection, site not specified: Secondary | ICD-10-CM | POA: Diagnosis not present

## 2017-02-24 DIAGNOSIS — R300 Dysuria: Secondary | ICD-10-CM | POA: Diagnosis not present

## 2017-02-24 DIAGNOSIS — N3010 Interstitial cystitis (chronic) without hematuria: Secondary | ICD-10-CM | POA: Diagnosis not present

## 2017-02-25 ENCOUNTER — Other Ambulatory Visit: Payer: Self-pay | Admitting: Family Medicine

## 2017-03-06 DIAGNOSIS — Z6832 Body mass index (BMI) 32.0-32.9, adult: Secondary | ICD-10-CM | POA: Diagnosis not present

## 2017-03-06 DIAGNOSIS — R8290 Unspecified abnormal findings in urine: Secondary | ICD-10-CM | POA: Diagnosis not present

## 2017-03-06 DIAGNOSIS — R300 Dysuria: Secondary | ICD-10-CM | POA: Diagnosis not present

## 2017-03-06 DIAGNOSIS — N390 Urinary tract infection, site not specified: Secondary | ICD-10-CM | POA: Diagnosis not present

## 2017-03-06 DIAGNOSIS — N3010 Interstitial cystitis (chronic) without hematuria: Secondary | ICD-10-CM | POA: Diagnosis not present

## 2017-03-11 ENCOUNTER — Other Ambulatory Visit: Payer: Self-pay | Admitting: *Deleted

## 2017-03-11 DIAGNOSIS — H43813 Vitreous degeneration, bilateral: Secondary | ICD-10-CM | POA: Diagnosis not present

## 2017-03-11 DIAGNOSIS — H1713 Central corneal opacity, bilateral: Secondary | ICD-10-CM | POA: Diagnosis not present

## 2017-03-11 DIAGNOSIS — Z961 Presence of intraocular lens: Secondary | ICD-10-CM | POA: Diagnosis not present

## 2017-03-11 DIAGNOSIS — E785 Hyperlipidemia, unspecified: Secondary | ICD-10-CM

## 2017-03-11 MED ORDER — ROSUVASTATIN CALCIUM 10 MG PO TABS: 10.0000 mg | ORAL_TABLET | Freq: Every day | ORAL | 3 refills | Status: DC

## 2017-03-12 ENCOUNTER — Other Ambulatory Visit: Payer: Self-pay | Admitting: *Deleted

## 2017-03-12 MED ORDER — CYCLOBENZAPRINE HCL 5 MG PO TABS: 5.0000 mg | ORAL_TABLET | Freq: Three times a day (TID) | ORAL | 2 refills | Status: DC | PRN

## 2017-03-12 MED ORDER — LEVOTHYROXINE SODIUM 75 MCG PO TABS: 75.0000 ug | ORAL_TABLET | Freq: Every day | ORAL | 3 refills | Status: DC

## 2017-03-26 ENCOUNTER — Ambulatory Visit: Payer: Medicare Other | Admitting: Internal Medicine

## 2017-03-31 ENCOUNTER — Other Ambulatory Visit: Payer: Self-pay | Admitting: *Deleted

## 2017-03-31 MED ORDER — TRIAMTERENE-HCTZ 37.5-25 MG PO CAPS
1.0000 | ORAL_CAPSULE | Freq: Every day | ORAL | 1 refills | Status: DC
Start: 2017-03-31 — End: 2017-09-25

## 2017-04-07 ENCOUNTER — Other Ambulatory Visit: Payer: Self-pay | Admitting: Family Medicine

## 2017-04-10 DIAGNOSIS — J322 Chronic ethmoidal sinusitis: Secondary | ICD-10-CM | POA: Diagnosis not present

## 2017-04-10 DIAGNOSIS — J040 Acute laryngitis: Secondary | ICD-10-CM | POA: Diagnosis not present

## 2017-04-10 DIAGNOSIS — J320 Chronic maxillary sinusitis: Secondary | ICD-10-CM | POA: Diagnosis not present

## 2017-04-10 DIAGNOSIS — H7011 Chronic mastoiditis, right ear: Secondary | ICD-10-CM | POA: Diagnosis not present

## 2017-04-10 DIAGNOSIS — H6121 Impacted cerumen, right ear: Secondary | ICD-10-CM | POA: Diagnosis not present

## 2017-04-11 ENCOUNTER — Emergency Department (HOSPITAL_COMMUNITY)
Admission: EM | Admit: 2017-04-11 | Discharge: 2017-04-11 | Disposition: A | Discharge status: Home / Self Care | Payer: Medicare Other | Attending: Emergency Medicine | Admitting: Emergency Medicine

## 2017-04-11 ENCOUNTER — Encounter (HOSPITAL_COMMUNITY): Payer: Self-pay

## 2017-04-11 DIAGNOSIS — R531 Weakness: Secondary | ICD-10-CM | POA: Insufficient documentation

## 2017-04-11 DIAGNOSIS — Z5321 Procedure and treatment not carried out due to patient leaving prior to being seen by health care provider: Secondary | ICD-10-CM | POA: Diagnosis not present

## 2017-04-11 LAB — CBG MONITORING, ED: Glucose-Capillary: 88 mg/dL (ref 65–99)

## 2017-04-11 LAB — BASIC METABOLIC PANEL
ANION GAP: 11 (ref 5–15)
BUN: 9 mg/dL (ref 6–20)
CHLORIDE: 100 mmol/L — AB (ref 101–111)
CO2: 27 mmol/L (ref 22–32)
CREATININE: 0.8 mg/dL (ref 0.44–1.00)
Calcium: 8.9 mg/dL (ref 8.9–10.3)
GFR calc non Af Amer: >60 mL/min (ref >60)
Glucose, Bld: 93 mg/dL (ref 65–99)
Potassium: 3.4 mmol/L — ABNORMAL LOW (ref 3.5–5.1)
SODIUM: 138 mmol/L (ref 135–145)

## 2017-04-11 LAB — CBC
HCT: 38.1 % (ref 36.0–46.0)
HEMOGLOBIN: 12.1 g/dL (ref 12.0–15.0)
MCH: 27.1 pg (ref 26.0–34.0)
MCHC: 31.8 g/dL (ref 30.0–36.0)
MCV: 85.4 fL (ref 78.0–100.0)
PLATELETS: 392 10*3/uL (ref 150–400)
RBC: 4.46 MIL/uL (ref 3.87–5.11)
RDW: 15.5 % (ref 11.5–15.5)
WBC: 5.4 10*3/uL (ref 4.0–10.5)

## 2017-04-11 LAB — URINALYSIS, ROUTINE W REFLEX MICROSCOPIC
Bilirubin Urine: NEGATIVE
Glucose, UA: NEGATIVE mg/dL
Hgb urine dipstick: NEGATIVE
Ketones, ur: NEGATIVE mg/dL
LEUKOCYTES UA: NEGATIVE
Nitrite: NEGATIVE
PROTEIN: NEGATIVE mg/dL
SPECIFIC GRAVITY, URINE: 1.01 (ref 1.005–1.030)
pH: 6 (ref 5.0–8.0)

## 2017-04-11 NOTE — ED Triage Notes (Signed)
Pt states that for the past several weeks she has been feeling "zapped" and weak. Pt states she gets very weak a feels like she is going to pass out. Pt reports recurrent bladder infections and states she is constantly on antibiotics. Reports weight loss of about 15 lbs in the past three weeks due to decreased appetite.

## 2017-04-11 NOTE — ED Notes (Signed)
Patient's husband updated on delays and wait time 

## 2017-04-11 NOTE — ED Notes (Signed)
Spoke again to husband.  Continues to express concerns and would like to transport his wife to [facility] or [facility].  Will d/c

## 2017-04-12 ENCOUNTER — Emergency Department (HOSPITAL_BASED_OUTPATIENT_CLINIC_OR_DEPARTMENT_OTHER)
Admission: EM | Admit: 2017-04-12 | Discharge: 2017-04-12 | Disposition: A | Discharge status: Home / Self Care | Payer: Medicare Other | Attending: Emergency Medicine | Admitting: Emergency Medicine

## 2017-04-12 ENCOUNTER — Encounter (HOSPITAL_BASED_OUTPATIENT_CLINIC_OR_DEPARTMENT_OTHER): Payer: Self-pay | Admitting: *Deleted

## 2017-04-12 DIAGNOSIS — R531 Weakness: Secondary | ICD-10-CM

## 2017-04-12 DIAGNOSIS — Z87891 Personal history of nicotine dependence: Secondary | ICD-10-CM | POA: Diagnosis not present

## 2017-04-12 DIAGNOSIS — Z7982 Long term (current) use of aspirin: Secondary | ICD-10-CM | POA: Insufficient documentation

## 2017-04-12 DIAGNOSIS — R5383 Other fatigue: Secondary | ICD-10-CM | POA: Diagnosis present

## 2017-04-12 DIAGNOSIS — Z79899 Other long term (current) drug therapy: Secondary | ICD-10-CM | POA: Insufficient documentation

## 2017-04-12 DIAGNOSIS — E039 Hypothyroidism, unspecified: Secondary | ICD-10-CM | POA: Insufficient documentation

## 2017-04-12 LAB — TSH: TSH: 1.372 u[IU]/mL (ref 0.350–4.500)

## 2017-04-12 MED ORDER — POTASSIUM CHLORIDE CRYS ER 20 MEQ PO TBCR
40.0000 meq | EXTENDED_RELEASE_TABLET | Freq: Once | ORAL | Status: AC
  Administered 2017-04-12: 40 meq via ORAL
  Filled 2017-04-12: qty 2

## 2017-04-12 NOTE — ED Triage Notes (Addendum)
Pt c/o generalized fatigue x 3 weeks, seen at [facility] ED earlier with labs done , taking ABX for UTI and being treated by urology
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
## 2017-05-01 ENCOUNTER — Other Ambulatory Visit: Payer: Self-pay | Admitting: Family Medicine

## 2017-05-01 DIAGNOSIS — N12 Tubulo-interstitial nephritis, not specified as acute or chronic: Secondary | ICD-10-CM | POA: Diagnosis not present

## 2017-05-15 DIAGNOSIS — M5136 Other intervertebral disc degeneration, lumbar region: Secondary | ICD-10-CM | POA: Diagnosis not present

## 2017-05-15 DIAGNOSIS — G894 Chronic pain syndrome: Secondary | ICD-10-CM | POA: Diagnosis not present

## 2017-05-15 DIAGNOSIS — M5416 Radiculopathy, lumbar region: Secondary | ICD-10-CM | POA: Diagnosis not present

## 2017-05-21 DIAGNOSIS — M5137 Other intervertebral disc degeneration, lumbosacral region: Secondary | ICD-10-CM | POA: Diagnosis not present

## 2017-05-21 DIAGNOSIS — M48061 Spinal stenosis, lumbar region without neurogenic claudication: Secondary | ICD-10-CM | POA: Diagnosis not present

## 2017-05-28 ENCOUNTER — Other Ambulatory Visit: Payer: Self-pay | Admitting: Family Medicine

## 2017-05-28 DIAGNOSIS — M5416 Radiculopathy, lumbar region: Secondary | ICD-10-CM | POA: Diagnosis not present

## 2017-05-28 DIAGNOSIS — M5136 Other intervertebral disc degeneration, lumbar region: Secondary | ICD-10-CM | POA: Diagnosis not present

## 2017-05-28 DIAGNOSIS — I10 Essential (primary) hypertension: Secondary | ICD-10-CM | POA: Diagnosis not present

## 2017-05-28 DIAGNOSIS — M4726 Other spondylosis with radiculopathy, lumbar region: Secondary | ICD-10-CM | POA: Diagnosis not present

## 2017-06-02 ENCOUNTER — Other Ambulatory Visit: Payer: Self-pay | Admitting: Family Medicine

## 2017-06-03 ENCOUNTER — Other Ambulatory Visit: Payer: Self-pay | Admitting: Family Medicine

## 2017-06-03 MED ORDER — POTASSIUM CHLORIDE CRYS ER 20 MEQ PO TBCR: 20.0000 meq | EXTENDED_RELEASE_TABLET | Freq: Two times a day (BID) | ORAL | 1 refills | Status: DC

## 2017-06-12 ENCOUNTER — Other Ambulatory Visit: Payer: Self-pay | Admitting: Family Medicine

## 2017-06-13 ENCOUNTER — Telehealth: Payer: Self-pay

## 2017-06-13 NOTE — Telephone Encounter (Signed)
I scheduled an appt for pt to see Dr. Leveda Anna on 11/15 for medication refill. This is the first available appt. Pt requesting a one month  Refill of cyclobenzaprine to get her through to her appt. Please call and let pt know if this is possible. Sunday Spillers, CMA

## 2017-06-14 MED ORDER — CYCLOBENZAPRINE HCL 5 MG PO TABS
5.0000 mg | ORAL_TABLET | Freq: Three times a day (TID) | ORAL | 0 refills | Status: DC | PRN
Start: 2017-06-14 — End: 2017-07-16

## 2017-06-14 NOTE — Telephone Encounter (Signed)
Will send one month refill.  At visit, will discuss switch to baclofen as cyclobenzaprine is a [facility] drug.

## 2017-06-14 NOTE — Addendum Note (Signed)
Addended by: Moses Manners on: 06/14/2017 09:04 AM   Modules accepted: Orders

## 2017-06-17 NOTE — Telephone Encounter (Signed)
Pt informed. Zimmerman Rumple, April D, CMA  

## 2017-06-21 ENCOUNTER — Emergency Department (HOSPITAL_BASED_OUTPATIENT_CLINIC_OR_DEPARTMENT_OTHER)
Admission: EM | Admit: 2017-06-21 | Discharge: 2017-06-22 | Disposition: A | Discharge status: Home / Self Care | Payer: Medicare Other | Attending: Physician Assistant | Admitting: Physician Assistant

## 2017-06-21 ENCOUNTER — Encounter (HOSPITAL_BASED_OUTPATIENT_CLINIC_OR_DEPARTMENT_OTHER): Payer: Self-pay | Admitting: Emergency Medicine

## 2017-06-21 DIAGNOSIS — Z79899 Other long term (current) drug therapy: Secondary | ICD-10-CM | POA: Insufficient documentation

## 2017-06-21 DIAGNOSIS — N390 Urinary tract infection, site not specified: Secondary | ICD-10-CM | POA: Diagnosis not present

## 2017-06-21 DIAGNOSIS — Z7982 Long term (current) use of aspirin: Secondary | ICD-10-CM | POA: Diagnosis not present

## 2017-06-21 DIAGNOSIS — E785 Hyperlipidemia, unspecified: Secondary | ICD-10-CM | POA: Diagnosis not present

## 2017-06-21 DIAGNOSIS — Z87891 Personal history of nicotine dependence: Secondary | ICD-10-CM | POA: Insufficient documentation

## 2017-06-21 DIAGNOSIS — R300 Dysuria: Secondary | ICD-10-CM | POA: Diagnosis present

## 2017-06-21 DIAGNOSIS — E039 Hypothyroidism, unspecified: Secondary | ICD-10-CM | POA: Insufficient documentation

## 2017-06-21 LAB — URINALYSIS, ROUTINE W REFLEX MICROSCOPIC
BILIRUBIN URINE: NEGATIVE
GLUCOSE, UA: 250 mg/dL — AB
KETONES UR: NEGATIVE mg/dL
Nitrite: POSITIVE — AB
PH: 6 (ref 5.0–8.0)
PROTEIN: 100 mg/dL — AB
Specific Gravity, Urine: 1.01 (ref 1.005–1.030)

## 2017-06-21 LAB — URINALYSIS, MICROSCOPIC (REFLEX)

## 2017-06-21 NOTE — ED Triage Notes (Signed)
Dysuria and hematuria today. Took AZO

## 2017-06-22 DIAGNOSIS — N390 Urinary tract infection, site not specified: Secondary | ICD-10-CM | POA: Diagnosis not present

## 2017-06-22 MED ORDER — CIPROFLOXACIN HCL 500 MG PO TABS
500.0000 mg | ORAL_TABLET | Freq: Once | ORAL | Status: AC
  Administered 2017-06-22: 500 mg via ORAL
  Filled 2017-06-22: qty 1

## 2017-06-22 MED ORDER — CIPROFLOXACIN HCL 500 MG PO TABS: 500.0000 mg | ORAL_TABLET | Freq: Two times a day (BID) | ORAL | 0 refills | Status: DC

## 2017-06-22 MED ORDER — ONDANSETRON 4 MG PO TBDP
4.0000 mg | ORAL_TABLET | Freq: Once | ORAL | Status: AC
  Administered 2017-06-22: 4 mg via ORAL
  Filled 2017-06-22: qty 1

## 2017-06-22 NOTE — Discharge Instructions (Signed)
Follow-up with your primary or urologist for the results of the culture.  We are starting you on Cipro but as we discussed we may need to change antibiotics if it is not a good antibiotic for this particular infection.
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
## 2017-06-22 NOTE — ED Notes (Signed)
Pt given d/c instructions as per chart. Verbalizes understanding. No questions. 

## 2017-06-23 DIAGNOSIS — N959 Unspecified menopausal and perimenopausal disorder: Secondary | ICD-10-CM | POA: Diagnosis not present

## 2017-06-23 LAB — URINE CULTURE: Culture: NO GROWTH

## 2017-06-25 ENCOUNTER — Ambulatory Visit (INDEPENDENT_AMBULATORY_CARE_PROVIDER_SITE_OTHER): Payer: Medicare Other | Admitting: Family Medicine

## 2017-06-25 ENCOUNTER — Encounter: Payer: Self-pay | Admitting: Family Medicine

## 2017-06-25 VITALS — BP 128/80 | HR 88 | Temp 98.2°F | Wt 161.0 lb

## 2017-06-25 DIAGNOSIS — I6523 Occlusion and stenosis of bilateral carotid arteries: Secondary | ICD-10-CM | POA: Diagnosis not present

## 2017-06-25 DIAGNOSIS — M7989 Other specified soft tissue disorders: Secondary | ICD-10-CM | POA: Diagnosis not present

## 2017-06-25 DIAGNOSIS — M79605 Pain in left leg: Secondary | ICD-10-CM

## 2017-06-25 DIAGNOSIS — Z23 Encounter for immunization: Secondary | ICD-10-CM

## 2017-06-25 MED ORDER — MELOXICAM 7.5 MG PO TABS: 7.5000 mg | ORAL_TABLET | Freq: Every day | ORAL | 0 refills | Status: DC

## 2017-06-25 MED ORDER — IBUPROFEN 600 MG PO TABS: 600.0000 mg | ORAL_TABLET | Freq: Four times a day (QID) | ORAL | 0 refills | Status: DC | PRN

## 2017-06-25 NOTE — Progress Notes (Signed)
   Subjective:    Patient ID: Theresa Brennan is a 71 y.o. female presenting with Edema  on 06/25/2017  HPI: Saturday started having a knot in her lower left leg. She notes that she has tried support hose which made her leg ache. Taking diuretics. Notes foot was drawing one direction or another. Notes some erythema. Takes ibuprofen which helped some. More pain yesterday. Thinks the socks made that worse. Has had a recent sinus and bladder infection. Worried about DVT as she has a strong family h/o this. She has long h/o low K and is on repletion. Has cramping in calves.  Review of Systems  Constitutional: Negative for chills and fever.  Respiratory: Negative for shortness of breath.   Cardiovascular: Positive for leg swelling. Negative for chest pain.  Gastrointestinal: Negative for abdominal pain, nausea and vomiting.  Genitourinary: Positive for dysuria, hematuria and urgency.  Skin: Negative for rash.      Objective:    BP 128/80   Pulse 88   Temp 98.2 F (36.8 C) (Oral)   Wt 161 lb (73 kg)   SpO2 99%   BMI 31.44 kg/m  Physical Exam  Constitutional: She is oriented to person, place, and time. She appears well-developed and well-nourished. No distress.  HENT:  Head: Normocephalic and atraumatic.  Eyes: No scleral icterus.  Neck: Neck supple.  Cardiovascular: Normal rate.   Pulmonary/Chest: Effort normal.  Abdominal: Soft.  Musculoskeletal: She exhibits edema (2-3+) and tenderness (on front of left tibia with pressure).  Neurological: She is alert and oriented to person, place, and time.  Skin: Skin is warm and dry.  Psychiatric: She has a normal mood and affect.        Assessment & Plan:   Problem List Items Addressed This Visit      Unprioritized   Leg swelling    Keep feet elevated. Continue Dyazide--K+ repletion.      Leg pain - Primary    Suspect she has had anterior cramping.      Relevant Medications   meloxicam (MOBIC) 7.5 MG tablet   Other Relevant  Orders   VAS [place] LOWER EXTREMITY VENOUS (DVT)    Other Visit Diagnoses    Need for immunization against influenza       Relevant Orders   Flu Vaccine QUAD 36+ mos IM (Completed)   Need for vaccination         Problem List Items Addressed This Visit      Unprioritized   Leg swelling    Keep feet elevated. Continue Dyazide--K+ repletion.      Leg pain - Primary    Suspect she has had anterior cramping.      Relevant Medications   meloxicam (MOBIC) 7.5 MG tablet   Other Relevant Orders   VAS [place] LOWER EXTREMITY VENOUS (DVT)    Other Visit Diagnoses    Need for immunization against influenza       Relevant Orders   Flu Vaccine QUAD 36+ mos IM (Completed)   Need for vaccination          Total face-to-face time with patient: 15 minutes. Over 50% of encounter was spent on counseling and coordination of care. Return if symptoms worsen or fail to improve, for keep next appointment with PCP.  Reva Bores 06/25/2017 11:58 AM

## 2017-06-25 NOTE — Assessment & Plan Note (Signed)
Keep feet elevated. Continue Dyazide--K+ repletion.

## 2017-06-25 NOTE — Assessment & Plan Note (Signed)
Suspect she has had anterior cramping.

## 2017-06-25 NOTE — Patient Instructions (Signed)
Edema Edema is when you have too much fluid in your body or under your skin. Edema may make your legs, feet, and ankles swell up. Swelling is also common in looser tissues, like around your eyes. This is a common condition. It gets more common as you get older. There are many possible causes of edema. Eating too much salt (sodium) and being on your feet or sitting for a long time can cause edema in your legs, feet, and ankles. Hot weather may make edema worse. Edema is usually painless. Your skin may look swollen or shiny. Follow these instructions at home:  Keep the swollen body part raised (elevated) above the level of your heart when you are sitting or lying down.  Do not sit still or stand for a long time.  Do not wear tight clothes. Do not wear garters on your upper legs.  Exercise your legs. This can help the swelling go down.  Wear elastic bandages or support stockings as told by your doctor.  Eat a low-salt (low-sodium) diet to reduce fluid as told by your doctor.  Depending on the cause of your swelling, you may need to limit how much fluid you drink (fluid restriction).  Take over-the-counter and prescription medicines only as told by your doctor. Contact a doctor if:  Treatment is not working.  You have heart, liver, or kidney disease and have symptoms of edema.  You have sudden and unexplained weight gain. Get help right away if:  You have shortness of breath or chest pain.  You cannot breathe when you lie down.  You have pain, redness, or warmth in the swollen areas.  You have heart, liver, or kidney disease and get edema all of a sudden.  You have a fever and your symptoms get worse all of a sudden. Summary  Edema is when you have too much fluid in your body or under your skin.  Edema may make your legs, feet, and ankles swell up. Swelling is also common in looser tissues, like around your eyes.  Raise (elevate) the swollen body part above the level of your  heart when you are sitting or lying down.  Follow your doctor's instructions about diet and how much fluid you can drink (fluid restriction). This information is not intended to replace advice given to you by your health care provider. Make sure you discuss any questions you have with your health care provider. Document Released: 01/29/2008 Document Revised: 08/30/2016 Document Reviewed: 08/30/2016 Elsevier Interactive Patient Education  2017 [employer]  

## 2017-06-26 ENCOUNTER — Ambulatory Visit (HOSPITAL_COMMUNITY): Payer: Medicare Other

## 2017-07-01 ENCOUNTER — Ambulatory Visit (HOSPITAL_COMMUNITY)
Admission: RE | Admit: 2017-07-01 | Discharge: 2017-07-01 | Disposition: A | Discharge status: Home / Self Care | Payer: Medicare Other | Source: Ambulatory Visit | Attending: Family Medicine | Admitting: Family Medicine

## 2017-07-01 DIAGNOSIS — M79605 Pain in left leg: Secondary | ICD-10-CM | POA: Diagnosis not present

## 2017-07-01 NOTE — Progress Notes (Signed)
Left lower extremity venous duplex has been completed. Negative for DVT. Results were faxed to Dr. Shawnie Pons.  07/01/17 2:44 PM Olen Cordial RVT

## 2017-07-10 ENCOUNTER — Ambulatory Visit: Payer: Medicare Other | Admitting: Family Medicine

## 2017-07-16 ENCOUNTER — Other Ambulatory Visit: Payer: Self-pay | Admitting: Family Medicine

## 2017-07-21 DIAGNOSIS — M5136 Other intervertebral disc degeneration, lumbar region: Secondary | ICD-10-CM | POA: Diagnosis not present

## 2017-07-21 DIAGNOSIS — M4726 Other spondylosis with radiculopathy, lumbar region: Secondary | ICD-10-CM | POA: Diagnosis not present

## 2017-07-24 ENCOUNTER — Ambulatory Visit (INDEPENDENT_AMBULATORY_CARE_PROVIDER_SITE_OTHER): Payer: Medicare Other | Admitting: Family Medicine

## 2017-07-24 ENCOUNTER — Encounter: Payer: Self-pay | Admitting: Family Medicine

## 2017-07-24 ENCOUNTER — Other Ambulatory Visit: Payer: Self-pay

## 2017-07-24 DIAGNOSIS — Z1231 Encounter for screening mammogram for malignant neoplasm of breast: Secondary | ICD-10-CM | POA: Diagnosis not present

## 2017-07-24 DIAGNOSIS — K219 Gastro-esophageal reflux disease without esophagitis: Secondary | ICD-10-CM

## 2017-07-24 DIAGNOSIS — E785 Hyperlipidemia, unspecified: Secondary | ICD-10-CM

## 2017-07-24 DIAGNOSIS — Z1239 Encounter for other screening for malignant neoplasm of breast: Secondary | ICD-10-CM | POA: Insufficient documentation

## 2017-07-24 DIAGNOSIS — I10 Essential (primary) hypertension: Secondary | ICD-10-CM

## 2017-07-24 DIAGNOSIS — E038 Other specified hypothyroidism: Secondary | ICD-10-CM

## 2017-07-24 DIAGNOSIS — Z1159 Encounter for screening for other viral diseases: Secondary | ICD-10-CM | POA: Diagnosis not present

## 2017-07-24 DIAGNOSIS — E2839 Other primary ovarian failure: Secondary | ICD-10-CM | POA: Diagnosis not present

## 2017-07-24 DIAGNOSIS — I6523 Occlusion and stenosis of bilateral carotid arteries: Secondary | ICD-10-CM | POA: Diagnosis not present

## 2017-07-24 DIAGNOSIS — E039 Hypothyroidism, unspecified: Secondary | ICD-10-CM

## 2017-07-24 NOTE — Assessment & Plan Note (Signed)
Overdue - order entered

## 2017-07-24 NOTE — Assessment & Plan Note (Signed)
Done density screen

## 2017-07-24 NOTE — Assessment & Plan Note (Signed)
Good control on current meds. 

## 2017-07-24 NOTE — Assessment & Plan Note (Signed)
Check lipids 

## 2017-07-24 NOTE — Patient Instructions (Signed)
I have ordered a whole bunch of tests for you.  I will call with results and we will go over at your next visit. Next visit, lets talk specifically about migraines and maybe changing medications. Your blood pressure is quite good today. Nice to get reacquainted with you.

## 2017-07-24 NOTE — Assessment & Plan Note (Signed)
Continue omeprazole.  Gi referral

## 2017-07-24 NOTE — Progress Notes (Signed)
   Subjective:    Patient ID: Theresa Brennan, female    DOB: [birth year], 71 y.o.   MRN: [id number]  HPI  Multiple issues - close gaps of care.  Issues: 1. Hx of GERD.  States it is quite severe.  Feels she needs a repeat EGD.  Last EGD is 2012 in [facility].  [facility] has left and she would like to reestablish care in [place].   2. May need colonoscopy.  Per our records, last screen was 2007.  Unclear if she has had an interim colonoscopy. 3. Overdue for mammogram. 4. Had a single bone density in the remote past.  States she had early surgical menopause - age 41.  Currently on replacement estrogen.   5. On levothyroxine - no recent TSH. 6. Never screened for Hep c 7. On crestor.  No recent lipids. 8. Defered discussion of changing migraine meds.    Review of Systems     Objective:   Physical Exam  Lungs clear Cardiac RRR without m or g Abd benign.        Assessment & Plan:

## 2017-07-25 ENCOUNTER — Telehealth: Payer: Self-pay | Admitting: Family Medicine

## 2017-07-25 LAB — CMP14+EGFR
A/G RATIO: 1.5 (ref 1.2–2.2)
ALBUMIN: 4.4 g/dL (ref 3.5–4.8)
ALT: 11 IU/L (ref 0–32)
AST: 16 IU/L (ref 0–40)
Alkaline Phosphatase: 74 IU/L (ref 39–117)
BUN / CREAT RATIO: 15 (ref 12–28)
BUN: 13 mg/dL (ref 8–27)
Bilirubin Total: <0.2 mg/dL (ref 0.0–1.2)
CALCIUM: 9.7 mg/dL (ref 8.7–10.3)
CO2: 26 mmol/L (ref 20–29)
Chloride: 97 mmol/L (ref 96–106)
Creatinine, Ser: 0.87 mg/dL (ref 0.57–1.00)
GFR, EST AFRICAN AMERICAN: 78 mL/min/{1.73_m2} (ref >59)
GFR, EST NON AFRICAN AMERICAN: 67 mL/min/{1.73_m2} (ref >59)
GLOBULIN, TOTAL: 2.9 g/dL (ref 1.5–4.5)
Glucose: 64 mg/dL — ABNORMAL LOW (ref 65–99)
POTASSIUM: 3.3 mmol/L — AB (ref 3.5–5.2)
Sodium: 142 mmol/L (ref 134–144)
Total Protein: 7.3 g/dL (ref 6.0–8.5)

## 2017-07-25 LAB — LIPID PANEL
CHOLESTEROL TOTAL: 167 mg/dL (ref 100–199)
Chol/HDL Ratio: 2.3 ratio (ref 0.0–4.4)
HDL: 73 mg/dL (ref >39)
LDL CALC: 69 mg/dL (ref 0–99)
TRIGLYCERIDES: 124 mg/dL (ref 0–149)
VLDL Cholesterol Cal: 25 mg/dL (ref 5–40)

## 2017-07-25 LAB — TSH: TSH: 1.4 u[IU]/mL (ref 0.450–4.500)

## 2017-07-25 LAB — HEPATITIS C ANTIBODY

## 2017-07-25 NOTE — Telephone Encounter (Signed)
Pt returning Dr. Hensels phone call.  

## 2017-07-28 NOTE — Telephone Encounter (Signed)
Called and LM.  Reiterated that blood work was good except slightly low potassium.

## 2017-08-08 ENCOUNTER — Encounter: Payer: Self-pay | Admitting: Gastroenterology

## 2017-08-08 ENCOUNTER — Other Ambulatory Visit: Payer: Self-pay | Admitting: Family Medicine

## 2017-08-28 ENCOUNTER — Inpatient Hospital Stay: Admission: RE | Admit: 2017-08-28 | Payer: Medicare Other | Source: Ambulatory Visit

## 2017-08-28 ENCOUNTER — Ambulatory Visit: Payer: Medicare Other

## 2017-09-01 DIAGNOSIS — J040 Acute laryngitis: Secondary | ICD-10-CM | POA: Diagnosis not present

## 2017-09-01 DIAGNOSIS — R490 Dysphonia: Secondary | ICD-10-CM | POA: Diagnosis not present

## 2017-09-01 DIAGNOSIS — J322 Chronic ethmoidal sinusitis: Secondary | ICD-10-CM | POA: Diagnosis not present

## 2017-09-01 DIAGNOSIS — J320 Chronic maxillary sinusitis: Secondary | ICD-10-CM | POA: Diagnosis not present

## 2017-09-01 DIAGNOSIS — J039 Acute tonsillitis, unspecified: Secondary | ICD-10-CM | POA: Diagnosis not present

## 2017-09-06 ENCOUNTER — Other Ambulatory Visit: Payer: Self-pay | Admitting: Family Medicine

## 2017-09-15 ENCOUNTER — Other Ambulatory Visit: Payer: Self-pay | Admitting: Family Medicine

## 2017-09-19 ENCOUNTER — Other Ambulatory Visit: Payer: Medicare Other

## 2017-09-19 ENCOUNTER — Ambulatory Visit: Payer: Medicare Other

## 2017-09-25 ENCOUNTER — Other Ambulatory Visit: Payer: Self-pay | Admitting: Family Medicine

## 2017-09-25 DIAGNOSIS — I10 Essential (primary) hypertension: Secondary | ICD-10-CM

## 2017-10-06 ENCOUNTER — Ambulatory Visit (INDEPENDENT_AMBULATORY_CARE_PROVIDER_SITE_OTHER): Payer: Medicare Other | Admitting: Gastroenterology

## 2017-10-06 ENCOUNTER — Other Ambulatory Visit: Payer: Self-pay

## 2017-10-06 VITALS — BP 144/94 | HR 88 | Ht 60.63 in | Wt 165.0 lb

## 2017-10-06 DIAGNOSIS — Z1211 Encounter for screening for malignant neoplasm of colon: Secondary | ICD-10-CM

## 2017-10-06 DIAGNOSIS — K219 Gastro-esophageal reflux disease without esophagitis: Secondary | ICD-10-CM

## 2017-10-06 DIAGNOSIS — R198 Other specified symptoms and signs involving the digestive system and abdomen: Secondary | ICD-10-CM

## 2017-10-06 DIAGNOSIS — R1310 Dysphagia, unspecified: Secondary | ICD-10-CM

## 2017-10-06 MED ORDER — OMEPRAZOLE 40 MG PO CPDR: 40.0000 mg | DELAYED_RELEASE_CAPSULE | Freq: Every day | ORAL | 1 refills | Status: DC

## 2017-10-06 MED ORDER — ONDANSETRON 4 MG PO TBDP: 4.0000 mg | ORAL_TABLET | Freq: Three times a day (TID) | ORAL | 1 refills | Status: DC | PRN

## 2017-10-06 MED ORDER — OMEPRAZOLE 40 MG PO CPDR: DELAYED_RELEASE_CAPSULE | ORAL | 1 refills | Status: DC
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
## 2017-10-06 NOTE — Patient Instructions (Signed)
You have been scheduled for an endoscopy. Please follow written instructions given to you at your visit today. If you use inhalers (even only as needed), please bring them with you on the day of your procedure. Your physician has requested that you go to www.startemmi.com and enter the access code given to you at your visit today. This web site gives a general overview about your procedure. However, you should still follow specific instructions given to you by our office regarding your preparation for the procedure.  We have sent the following medications to your pharmacy for you to pick up at your convenience: Omeprazole Zofran  Please purchase over the counter Citrucel and being taking each day.  Thank you for entrusting me with your care and for choosing [facility], Dr. Ileene Patrick

## 2017-10-07 ENCOUNTER — Ambulatory Visit (AMBULATORY_SURGERY_CENTER): Payer: Medicare Other | Admitting: Gastroenterology

## 2017-10-07 ENCOUNTER — Encounter: Payer: Self-pay | Admitting: Gastroenterology

## 2017-10-07 ENCOUNTER — Other Ambulatory Visit: Payer: Self-pay

## 2017-10-07 VITALS — BP 118/62 | HR 84 | Temp 96.9°F | Resp 12 | Ht 60.5 in | Wt 165.0 lb

## 2017-10-07 DIAGNOSIS — R1310 Dysphagia, unspecified: Secondary | ICD-10-CM

## 2017-10-07 DIAGNOSIS — K259 Gastric ulcer, unspecified as acute or chronic, without hemorrhage or perforation: Secondary | ICD-10-CM | POA: Diagnosis not present

## 2017-10-07 DIAGNOSIS — K219 Gastro-esophageal reflux disease without esophagitis: Secondary | ICD-10-CM | POA: Diagnosis not present

## 2017-10-07 DIAGNOSIS — K3189 Other diseases of stomach and duodenum: Secondary | ICD-10-CM | POA: Diagnosis not present

## 2017-10-07 MED ORDER — SODIUM CHLORIDE 0.9 % IV SOLN: 500.0000 mL | Freq: Once | INTRAVENOUS | Status: DC

## 2017-10-07 NOTE — Patient Instructions (Addendum)
YOU HAD AN ENDOSCOPIC PROCEDURE TODAY AT THE  ENDOSCOPY CENTER:   Refer to the procedure report that was given to you for any specific questions about what was found during the examination.  If the procedure report does not answer your questions, please call your gastroenterologist to clarify.  If you requested that your care partner not be given the details of your procedure findings, then the procedure report has been included in a sealed envelope for you to review at your convenience later.  YOU SHOULD EXPECT: Some feelings of bloating in the abdomen. Passage of more gas than usual.  Walking can help get rid of the air that was put into your GI tract during the procedure and reduce the bloating. If you had a lower endoscopy (such as a colonoscopy or flexible sigmoidoscopy) you may notice spotting of blood in your stool or on the toilet paper. If you underwent a bowel prep for your procedure, you may not have a normal bowel movement for a few days.  Please Note:  You might notice some irritation and congestion in your nose or some drainage.  This is from the oxygen used during your procedure.  There is no need for concern and it should clear up in a day or so.  SYMPTOMS TO REPORT IMMEDIATELY:   Following upper endoscopy (EGD)  Vomiting of blood or coffee ground material  New chest pain or pain under the shoulder blades  Painful or persistently difficult swallowing  New shortness of breath  Fever of 100F or higher  Black, tarry-looking stools  For urgent or emergent issues, a gastroenterologist can be reached at any hour by calling [phone].   DIET:  We do recommend a small meal at first, but then you may proceed to your regular diet.  Drink plenty of fluids but you should avoid alcoholic beverages for 24 hours.  ACTIVITY:  You should plan to take it easy for the rest of today and you should NOT DRIVE or use heavy machinery until tomorrow (because of the sedation medicines used  during the test).    FOLLOW UP: Our staff will call the number listed on your records the next business day following your procedure to check on you and address any questions or concerns that you may have regarding the information given to you following your procedure. If we do not reach you, we will leave a message.  However, if you are feeling well and you are not experiencing any problems, there is no need to return our call.  We will assume that you have returned to your regular daily activities without incident.  If any biopsies were taken you will be contacted by phone or by letter within the next 1-3 weeks.  Please call us at [phone] if you have not heard about the biopsies in 3 weeks.    SIGNATURES/CONFIDENTIALITY: You and/or your care partner have signed paperwork which will be entered into your electronic medical record.  These signatures attest to the fact that that the information above on your After Visit Summary has been reviewed and is understood.  Full responsibility of the confidentiality of this discharge information lies with you and/or your care-partner. 

## 2017-10-07 NOTE — Progress Notes (Signed)
Called to room to assist during endoscopic procedure.  Patient ID and intended procedure confirmed with present staff. Received instructions for my participation in the procedure from the performing physician.  

## 2017-10-07 NOTE — Op Note (Signed)
[facility] Patient Name: Theresa Brennan Procedure Date: 10/07/2017 11:10 AM MRN: [id number] Endoscopist: Viviann Spare P. Elward Nocera MD, MD Age: 72 Referring MD:  Date of Birth: 1945-08-31 Gender: Female Account #: [id number] Procedure:                Upper GI endoscopy Indications:              Dysphagia to large pills in sternal notch,                            Follow-up of esophageal reflux, Nausea, history of                            gastric ulcer - on omeprazole 20mg  once daily with                            breakthrough symptoms Medicines:                Monitored Anesthesia Care Procedure:                Pre-Anesthesia Assessment:                           - Prior to the procedure, a History and Physical                            was performed, and patient medications and                            allergies were reviewed. The patient's tolerance of                            previous anesthesia was also reviewed. The risks                            and benefits of the procedure and the sedation                            options and risks were discussed with the patient.                            All questions were answered, and informed consent                            was obtained. Prior Anticoagulants: The patient has                            taken no previous anticoagulant or antiplatelet                            agents. ASA Grade Assessment: II - A patient with                            mild systemic disease. After reviewing the risks  and benefits, the patient was deemed in                            satisfactory condition to undergo the procedure.                           After obtaining informed consent, the endoscope was                            passed under direct vision. Throughout the                            procedure, the patient's blood pressure, pulse, and                            oxygen saturations were monitored  continuously. The                            Endoscope was introduced through the mouth, and                            advanced to the second part of duodenum. The upper                            GI endoscopy was accomplished without difficulty.                            The patient tolerated the procedure well. Scope In: Scope Out: Findings:                 Esophagogastric landmarks were identified: the                            Z-line was found at 35 cm, the gastroesophageal                            junction was found at 35 cm and the upper extent of                            the gastric folds was found at 35 cm from the                            incisors.                           The exam of the esophagus was otherwise normal.                           A guidewire was placed and the scope was withdrawn.                            Empiric dilation was performed in the entire  esophagus with a Savary dilator with no resistance                            at 17 mm and 18 mm. Relook endoscopy showed no                            mucosal wrents.                           One linear gastric ulcer with no stigmata of                            bleeding was found in the gastric antrum. The                            lesion was 5 mm in largest dimension. Biopsies were                            taken from the periphery of the lesion with a cold                            forceps for histology.                           The exam of the stomach was otherwise normal.                           Biopsies were taken with a cold forceps in the                            gastric body, at the incisura and in the gastric                            antrum for Helicobacter pylori testing.                           The duodenal bulb and second portion of the                            duodenum were normal. Complications:            No immediate complications. Estimated  blood loss:                            Minimal. Estimated Blood Loss:     Estimated blood loss was minimal. Impression:               - Esophagogastric landmarks identified.                           - Normal esophagus without focal stenosis                            appreciated                           -  Empiric dilation performed to 12mm for suspected                            mild cricopharyngeal stenosis based on symptoms.                            Relook endoscopy showed no mucosal wrents.                           - Gastric ulcer with no stigmata of bleeding.                            Biopsied.                           - Normal stomach otherwise, biopsies taken to rule                            out H pylori.                           - Normal duodenal bulb and second portion of the                            duodenum. Recommendation:           - Patient has a contact number available for                            emergencies. The signs and symptoms of potential                            delayed complications were discussed with the                            patient. Return to normal activities tomorrow.                            Written discharge instructions were provided to the                            patient.                           - Resume previous diet.                           - Continue present medications.                           - Take omeprazole 40mg  twice daily for 1 month,                            then decrease to 40mg  once daily                           - Await pathology  results and course following                            dilation Mckaylah Bettendorf P. Cedrica Brune MD, MD 10/07/2017 11:33:59 AM This report has been signed electronically.

## 2017-10-07 NOTE — Progress Notes (Signed)
Pt's states no medical or surgical changes since previsit or office visit. 

## 2017-10-07 NOTE — Progress Notes (Signed)
To PACU, VSS. Report to [email or web address] 

## 2017-10-08 ENCOUNTER — Telehealth: Payer: Self-pay | Admitting: *Deleted

## 2017-10-08 ENCOUNTER — Telehealth: Payer: Self-pay | Admitting: Gastroenterology

## 2017-10-08 NOTE — Telephone Encounter (Signed)
No answer for post procedure call back will attempt to call back later this afternoon. Sm 

## 2017-10-08 NOTE — Telephone Encounter (Signed)
  Follow up Call-  Call back number 10/07/2017  Post procedure Call Back phone  # [phone]  Permission to leave phone message Yes  Some recent data might be hidden     Patient questions:  Do you have a fever, pain , or abdominal swelling? No. Pain Score  2 *  Have you tolerated food without any problems? Yes.    Have you been able to return to your normal activities? Yes.    Do you have any questions about your discharge instructions: Diet   No. Medications  No. Follow up visit  No.  Do you have questions or concerns about your Care? No.  Actions: * If pain score is 4 or above: No action needed, pain <4.

## 2017-10-08 NOTE — Telephone Encounter (Signed)
Records received from prior endoscopy.   Colonoscopy 01/25/2011 - Dr. Bing Plume - normal colon, small hemorrhoids, good prep, no polyps   Jan can you please place a recall for this patient for 01/2021 for screening colonoscopy. Thanks

## 2017-10-09 NOTE — Telephone Encounter (Signed)
Recall entered  °

## 2017-10-13 ENCOUNTER — Encounter: Payer: Self-pay | Admitting: Gastroenterology

## 2017-10-16 DIAGNOSIS — M75101 Unspecified rotator cuff tear or rupture of right shoulder, not specified as traumatic: Secondary | ICD-10-CM | POA: Diagnosis not present

## 2017-10-27 ENCOUNTER — Other Ambulatory Visit: Payer: Self-pay | Admitting: Family Medicine

## 2017-11-12 ENCOUNTER — Ambulatory Visit (INDEPENDENT_AMBULATORY_CARE_PROVIDER_SITE_OTHER): Payer: Medicare Other | Admitting: Family Medicine

## 2017-11-12 ENCOUNTER — Other Ambulatory Visit: Payer: Self-pay

## 2017-11-12 ENCOUNTER — Encounter: Payer: Self-pay | Admitting: Family Medicine

## 2017-11-12 DIAGNOSIS — G43009 Migraine without aura, not intractable, without status migrainosus: Secondary | ICD-10-CM

## 2017-11-12 DIAGNOSIS — J329 Chronic sinusitis, unspecified: Secondary | ICD-10-CM | POA: Diagnosis not present

## 2017-11-12 MED ORDER — METOPROLOL SUCCINATE ER 50 MG PO TB24: 50.0000 mg | ORAL_TABLET | Freq: Every day | ORAL | 3 refills | Status: DC

## 2017-11-12 MED ORDER — AZITHROMYCIN 250 MG PO TABS: ORAL_TABLET | ORAL | 0 refills | Status: DC

## 2017-11-12 NOTE — Patient Instructions (Signed)
Remind me to check that right ear next time you are in. I sent in an antibiotic for your ear and sinus infection. The new drug, metoprolol, is to hopefully decrease the frequency and/or severity of your migraines.

## 2017-11-13 ENCOUNTER — Encounter: Payer: Self-pay | Admitting: Family Medicine

## 2017-11-13 NOTE — Assessment & Plan Note (Signed)
Will Rx with azithro (pen allergic)

## 2017-11-13 NOTE — Progress Notes (Signed)
   Subjective:    Patient ID: Theresa Brennan, female    DOB: 07-30-1946, 72 y.o.   MRN: [id number]  HPI Two issues:  1. Sinus and right ear pain/pressure.  Present x 1 week.  Worsening.  No fever.  Hx of sinusitis and springtime allergies.  Note: pen allergic 2. Migraines.  Very frequent  Gets 9 immitrex per month per insurance.  Usually takes 1/2 tab and runs out early.  Means more than 18 migraines per month.  Not on any prophylactic medication.  Does not want to be on something that makes her gain wt.  Her cardiologist wants her to take less immitrex.  No headache today.    Review of Systems     Objective:   Physical Exam Rt TM dull and seems to have dried blood on it.  Left TM OK.  Tender to percussion of max and frontal sinuses. Throat normal Neck no sig adenopathy.      Assessment & Plan:

## 2017-11-13 NOTE — Assessment & Plan Note (Signed)
No increase in immitrex.  Add metoprolol for migraine prophylaxis. FU 1 month to check migraine pattern

## 2017-11-19 ENCOUNTER — Telehealth: Payer: Self-pay

## 2017-11-19 NOTE — Telephone Encounter (Signed)
Patient left message on nurse line that the antibiotic did not help her pain and symptoms. Requests that doxycycline be sent because that has helped in past. Ples Specter, RN [facility] RN)

## 2017-11-20 MED ORDER — DOXYCYCLINE HYCLATE 100 MG PO TABS: 100.0000 mg | ORAL_TABLET | Freq: Two times a day (BID) | ORAL | 0 refills | Status: DC

## 2017-11-20 NOTE — Telephone Encounter (Signed)
Done and patient notified.

## 2017-12-07 DIAGNOSIS — R309 Painful micturition, unspecified: Secondary | ICD-10-CM | POA: Diagnosis not present

## 2017-12-07 DIAGNOSIS — N390 Urinary tract infection, site not specified: Secondary | ICD-10-CM | POA: Diagnosis not present

## 2017-12-10 ENCOUNTER — Ambulatory Visit (INDEPENDENT_AMBULATORY_CARE_PROVIDER_SITE_OTHER): Payer: Medicare Other | Admitting: Family Medicine

## 2017-12-10 ENCOUNTER — Encounter: Payer: Self-pay | Admitting: Family Medicine

## 2017-12-10 ENCOUNTER — Other Ambulatory Visit: Payer: Self-pay

## 2017-12-10 DIAGNOSIS — M79604 Pain in right leg: Secondary | ICD-10-CM

## 2017-12-10 DIAGNOSIS — M79605 Pain in left leg: Secondary | ICD-10-CM | POA: Diagnosis not present

## 2017-12-10 NOTE — Assessment & Plan Note (Signed)
Cramping in nature. Patient has h/o restless leg syndrome. Previous labs checked in 06/2017 with normal TSH, Calcium, lipid panel. Did have low potassium at that time, however patient is on chronic supplementation. Patient is on statin however denies weakness and myalgia is more distal than would be expected. No signs of DVT on exam, also with negative dopplers in 06/2017. Given that her pain improves with movement, heating pad, this may be related to her restless leg syndrome. Also encouraged adequate hydration and activity. Advised to use home flexeril as needed if conservative measures are not helpful. Return precautions given.

## 2017-12-10 NOTE — Patient Instructions (Signed)
It was great to see you!  For your leg pain,  - You are likely having muscle cramps/spasms from your extremities/toes getting cold as you have relief with heat and increased circulation. - continue to use heating pad for pain and staying active to ensure good circulation to your legs. - You can use the flexeril you have at home to help with the pain as well - certainly if you have worsened or persistent pain, redness, or swelling, please return to the clinic to be seen.  Take care and seek immediate care sooner if you develop any concerns.   Dr. [facility]  Leg Cramps Leg cramps occur when a muscle or muscles tighten and you have no control over this tightening (involuntary muscle contraction). Muscle cramps can develop in any muscle, but the most common place is in the calf muscles of the leg. Those cramps can occur during exercise or when you are at rest. Leg cramps are painful, and they may last for a few seconds to a few minutes. Cramps may return several times before they finally stop. Usually, leg cramps are not caused by a serious medical problem. In many cases, the cause is not known. Some common causes include:  Overexertion.  Overuse from repetitive motions, or doing the same thing over and over.  Remaining in a certain position for a long period of time.  Improper preparation, form, or technique while performing a sport or an activity.  Dehydration.  Injury.  Side effects of some medicines.  Abnormally low levels of the salts and ions in your blood (electrolytes), especially potassium and calcium. These levels could be low if you are taking water pills (diuretics) or if you are pregnant.  Follow these instructions at home: Watch your condition for any changes. Taking the following actions may help to lessen any discomfort that you are feeling:  Stay well-hydrated. Drink enough fluid to keep your urine clear or pale yellow.  Try massaging, stretching,  and relaxing the affected muscle. Do this for several minutes at a time.  For tight or tense muscles, use a warm towel, heating pad, or hot shower water directed to the affected area.  If you are sore or have pain after a cramp, applying ice to the affected area may relieve discomfort. ? Put ice in a plastic bag. ? Place a towel between your skin and the bag. ? Leave the ice on for 20 minutes, 2-3 times per day.  Avoid strenuous exercise for several days if you have been having frequent leg cramps.  Make sure that your diet includes the essential minerals for your muscles to work normally.  Take medicines only as directed by your health care provider.  Contact a health care provider if:  Your leg cramps get more severe or more frequent, or they do not improve over time.  Your foot becomes cold, numb, or blue. This information is not intended to replace advice given to you by your health care provider. Make sure you discuss any questions you have with your health care provider. Document Released: 09/19/2004 Document Revised: 01/18/2016 Document Reviewed: 07/20/2014 Elsevier Interactive Patient Education  [employer].

## 2017-12-10 NOTE — Progress Notes (Signed)
   Subjective:   Patient ID: Theresa Brennan    DOB: 1946-04-29, 72 y.o. female   MRN: [id number]  Theresa Brennan is a 72 y.o. female with a history of migraine, HTN, CHF, carotid stenosis, chronic sinusitis, seasonal allergies, GERD, IBS, OA, chronic pain syndrome, somatization disorder, restless leg syndrome here for   EXTREMITY PAIN Endorses lower leg cramping sometimes when wearing compression socks, although doesn't wear them much anymore. Pulling down the socks makes them better. It starts on the lateral portion of both legs and includes her feet. Feels like spasms. Started 3 months ago when first started using compression socks. Pain comes and goes. Heating pad helps to ease the pain and makes muscles relax. Tried stronger ibuprofen but didn't help. Has som flexeril at home that she took last night and it helped a lot. Standing on her feet and moving around makes it feel better but when she lays down in bed, it gets worse. Denies recent trauma, redness, fevers, weakness, weight loss, or rashes. Has some LE swelling at baseline that she takes diuretic for.  Review of Systems:  Per HPI.   PMFSH: reviewed. Smoking status reviewed. Medications reviewed.  Objective:   BP 118/64   Pulse 75   Temp 98.2 F (36.8 C) (Oral)   Ht 5' (1.524 m)   Wt 168 lb 3.2 oz (76.3 kg)   SpO2 97%   BMI 32.85 kg/m  Vitals and nursing note reviewed.  General: well nourished, well developed, in no acute distress with non-toxic appearance CV: regular rate and rhythm without murmurs, rubs, or gallops.  Lungs: clear to auscultation bilaterally with normal work of breathing Skin: warm, dry, no rashes or lesions Extremities: warm and well perfused, normal tone. Minimal nonpitting LE edema bilaterally. No erythema, tightness. Some tenderness to palpation that she states is chronic. MSK: ROM grossly intact, strength intact, gait normal Neuro: Alert and oriented, speech normal  Assessment & Plan:   Lower extremity  pain, bilateral Cramping in nature. Patient has h/o restless leg syndrome. Previous labs checked in 06/2017 with normal TSH, Calcium, lipid panel. Did have low potassium at that time, however patient is on chronic supplementation. Patient is on statin however denies weakness and myalgia is more distal than would be expected. No signs of DVT on exam, also with negative dopplers in 06/2017. Given that her pain improves with movement, heating pad, this may be related to her restless leg syndrome. Also encouraged adequate hydration and activity. Advised to use home flexeril as needed if conservative measures are not helpful. Return precautions given.  No orders of the defined types were placed in this encounter.  No orders of the defined types were placed in this encounter.   Ellwood Dense, DO PGY-1, [facility] 12/10/2017 7:16 PM

## 2017-12-15 ENCOUNTER — Other Ambulatory Visit: Payer: Self-pay | Admitting: Family Medicine

## 2017-12-15 DIAGNOSIS — E785 Hyperlipidemia, unspecified: Secondary | ICD-10-CM

## 2017-12-16 ENCOUNTER — Telehealth: Payer: Self-pay | Admitting: Family Medicine

## 2017-12-16 ENCOUNTER — Other Ambulatory Visit: Payer: Self-pay | Admitting: Family Medicine

## 2017-12-16 NOTE — Telephone Encounter (Signed)
Contacted pt and gave her the below message and she will be here on Thursday at 3:30pm to see Dr. Leveda Anna. Lamonte Sakai, Ceasar Decandia D, [place]

## 2017-12-16 NOTE — Telephone Encounter (Signed)
Pt called because she is in excruciating pain with her back. Pt was seen last week for it but wasn't given any help for it. Pt would like a referral to a neurologist. Pt has an appt with [facility] on May 2nd, but does not want to wait until then to get this referral. Pt was wanting to get in sooner than the 2nd, but [facility] is completely booked until then. Pt would like the referral placed with Dr Asa Lente and his number is [phone] at [facility]. Please advise

## 2017-12-16 NOTE — Telephone Encounter (Signed)
I will make myself available to see her at 3:30 PM this Thursday 4/25.   Best to see her first before neuro referral.

## 2017-12-18 ENCOUNTER — Encounter: Payer: Self-pay | Admitting: Family Medicine

## 2017-12-18 ENCOUNTER — Ambulatory Visit (INDEPENDENT_AMBULATORY_CARE_PROVIDER_SITE_OTHER): Payer: Medicare Other | Admitting: Family Medicine

## 2017-12-18 ENCOUNTER — Other Ambulatory Visit: Payer: Self-pay

## 2017-12-18 DIAGNOSIS — M159 Polyosteoarthritis, unspecified: Secondary | ICD-10-CM

## 2017-12-18 DIAGNOSIS — M79604 Pain in right leg: Secondary | ICD-10-CM

## 2017-12-18 DIAGNOSIS — M79605 Pain in left leg: Secondary | ICD-10-CM | POA: Diagnosis not present

## 2017-12-18 DIAGNOSIS — G43009 Migraine without aura, not intractable, without status migrainosus: Secondary | ICD-10-CM

## 2017-12-18 DIAGNOSIS — I10 Essential (primary) hypertension: Secondary | ICD-10-CM | POA: Diagnosis not present

## 2017-12-18 NOTE — Patient Instructions (Signed)
Keep the appointment next week. I will call tomorrow with the blood test results. Stay off the metoprolol until you see me again. The next step will be: 1. If you are still feeling bad we will restart the metoprolol and also start a new nerve pain medication. 2. If you are feeling good, we will blame the metoprolol and look for something else to help your migraines.

## 2017-12-19 ENCOUNTER — Encounter: Payer: Self-pay | Admitting: Family Medicine

## 2017-12-19 LAB — CBC
Hematocrit: 37.9 % (ref 34.0–46.6)
Hemoglobin: 12.8 g/dL (ref 11.1–15.9)
MCH: 29.1 pg (ref 26.6–33.0)
MCHC: 33.8 g/dL (ref 31.5–35.7)
MCV: 86 fL (ref 79–97)
PLATELETS: 354 10*3/uL (ref 150–379)
RBC: 4.4 x10E6/uL (ref 3.77–5.28)
RDW: 16.6 % — AB (ref 12.3–15.4)
WBC: 7.2 10*3/uL (ref 3.4–10.8)

## 2017-12-19 LAB — BASIC METABOLIC PANEL
BUN/Creatinine Ratio: 14 (ref 12–28)
BUN: 13 mg/dL (ref 8–27)
CALCIUM: 9.6 mg/dL (ref 8.7–10.3)
CHLORIDE: 100 mmol/L (ref 96–106)
CO2: 26 mmol/L (ref 20–29)
Creatinine, Ser: 0.92 mg/dL (ref 0.57–1.00)
GFR calc Af Amer: 72 mL/min/{1.73_m2} (ref >59)
GFR calc non Af Amer: 62 mL/min/{1.73_m2} (ref >59)
GLUCOSE: 82 mg/dL (ref 65–99)
POTASSIUM: 4.1 mmol/L (ref 3.5–5.2)
SODIUM: 143 mmol/L (ref 134–144)

## 2017-12-19 NOTE — Progress Notes (Signed)
   Subjective:    Patient ID: Theresa Brennan, female    DOB: [birth year], 72 y.o.   MRN: [id number]  HPI Patient has longstanding lower leg dysasthesias (problem list does not specifically diagnose but includes restless leg, lower ext pain, chronic pain and somatization disorder.)  Ever since starting metoprolol for migraine headache prophylaxis, she has had spells of pain and cramping of both feet and both hands.  (Metoprolol did seem to help BP and she had less migraines.)  Stopped metoprolol on own 3 days ago and maybe some improvement in cramping.  Has a history of medication related hypkalemia and is on supplemental K+.  No other trauma, med or lifestyle changes.  No cramping at the time of her visit. Does have chronic back pain, upper lumbar, lower thoracic.  Apparently seeing neurosurgeon who has done a recent MRI and recommended surgery.  I do not have access to that MRI.   Review of Systems     Objective:   Physical Exam  BP noted to be elevated. Normal gait and leg strength.  Hyperesthesia over both lower legs medially. Good reflexes and peripheral pulses.        Assessment & Plan:

## 2017-12-19 NOTE — Assessment & Plan Note (Signed)
Pain does not sound like radiculopathy or spinal stenosis because bilateral, not worsened by exercise and now involves hands.

## 2017-12-19 NOTE — Assessment & Plan Note (Signed)
Both because of her migraines and her hypertension, I would like her back on Beta Blocker.  Will leave off for 1 week and see if pain really does improve.  Has 1 week follow up already scheduled.

## 2017-12-19 NOTE — Assessment & Plan Note (Signed)
She will need a prophylactic agent.  I'd like her back on the beta blocker because it was working and helping BP

## 2017-12-19 NOTE — Assessment & Plan Note (Signed)
Her chronic pain seems neuropathic.  Subacute cramping sounds muscular.  The only relationship I could make with the metoprolol would be via PVD - but she has good pedal pulses.  Hypokalemia is a reasonable consideration despite oral K replacement and K sparing diuretic.

## 2017-12-22 ENCOUNTER — Telehealth: Payer: Self-pay | Admitting: Family Medicine

## 2017-12-22 NOTE — Telephone Encounter (Signed)
Pt said she had a missed call from [facility].  There is nothing in her chart about a phone call. Please advise

## 2017-12-23 NOTE — Telephone Encounter (Signed)
Called and LM indicating that I had not called earlier.  She should keep her scheduled appointment.

## 2017-12-23 NOTE — Telephone Encounter (Signed)
Routing to PCP to see if he possibly called pt.  Could possibly be the automated reminder call for an appointment.  She has an upcoming appointment with Dr. Leveda Anna on 12/25/2017 @ 11:10am. Lamonte Sakai, April D, CMA

## 2017-12-25 ENCOUNTER — Other Ambulatory Visit: Payer: Self-pay

## 2017-12-25 ENCOUNTER — Encounter: Payer: Self-pay | Admitting: Family Medicine

## 2017-12-25 ENCOUNTER — Ambulatory Visit (INDEPENDENT_AMBULATORY_CARE_PROVIDER_SITE_OTHER): Payer: Medicare Other | Admitting: Family Medicine

## 2017-12-25 DIAGNOSIS — G43009 Migraine without aura, not intractable, without status migrainosus: Secondary | ICD-10-CM | POA: Diagnosis not present

## 2017-12-25 MED ORDER — GABAPENTIN 100 MG PO CAPS: 100.0000 mg | ORAL_CAPSULE | Freq: Three times a day (TID) | ORAL | 3 refills | Status: DC

## 2017-12-25 NOTE — Assessment & Plan Note (Signed)
Some of her ongoing chronic pain may be neuropathic.  We decided to try gabapentin for migraine prophylaxis and hope to treat several problems.  She has memory loss on her problem list.  Made clear to her and husband that sedation and mental fogging were possible.  Start VERY low dose.  100 mg qhs and build up.

## 2017-12-25 NOTE — Patient Instructions (Signed)
Of course, stay off the metoprolol. I sent in a new prescription for gabapentin.  Start by taking only one capsule at night.  It may do three good and one bad thing. Good things 1. Decrease the frequency of your migraines 2. Decrease the pain in feet and hands 3. Help you sleep better. Bad thing 1. It could make you groggy, mentally slow, drunk.   We will play around with the dose and figure out what works for you.   Call me in 2 weeks to let me know where we stand.

## 2017-12-25 NOTE — Progress Notes (Signed)
   Subjective:    Patient ID: Theresa Brennan, female    DOB: [birth year], 72 y.o.   MRN: [id number]  HPI FU bilateral leg and hand pain.  See previous note.  She has improved remarkably with stopping metoprolol (why?) other than an idiosyncratic reaction, the best I can come up with is an exacerbation of previously undiagnosed Raynouds.  She does have cold hand intollerance.    Unfortunately, metoprolol did help her migraines and we now need to find another prophylactic agent.    Review of Systems     Objective:   Physical Exam Hands and feet.  Normal pulses and sensation.      Assessment & Plan:

## 2018-01-12 DIAGNOSIS — N390 Urinary tract infection, site not specified: Secondary | ICD-10-CM | POA: Diagnosis not present

## 2018-02-06 ENCOUNTER — Other Ambulatory Visit: Payer: Self-pay | Admitting: Family Medicine

## 2018-02-26 DIAGNOSIS — N390 Urinary tract infection, site not specified: Secondary | ICD-10-CM | POA: Diagnosis not present

## 2018-03-09 DIAGNOSIS — M4726 Other spondylosis with radiculopathy, lumbar region: Secondary | ICD-10-CM | POA: Diagnosis not present

## 2018-03-09 DIAGNOSIS — J3489 Other specified disorders of nose and nasal sinuses: Secondary | ICD-10-CM | POA: Diagnosis not present

## 2018-03-09 DIAGNOSIS — M5136 Other intervertebral disc degeneration, lumbar region: Secondary | ICD-10-CM | POA: Diagnosis not present

## 2018-03-11 DIAGNOSIS — H1713 Central corneal opacity, bilateral: Secondary | ICD-10-CM | POA: Diagnosis not present

## 2018-03-11 DIAGNOSIS — H353131 Nonexudative age-related macular degeneration, bilateral, early dry stage: Secondary | ICD-10-CM | POA: Diagnosis not present

## 2018-03-11 DIAGNOSIS — H52213 Irregular astigmatism, bilateral: Secondary | ICD-10-CM | POA: Diagnosis not present

## 2018-03-11 DIAGNOSIS — Z961 Presence of intraocular lens: Secondary | ICD-10-CM | POA: Diagnosis not present

## 2018-03-11 DIAGNOSIS — H43813 Vitreous degeneration, bilateral: Secondary | ICD-10-CM | POA: Diagnosis not present

## 2018-03-19 ENCOUNTER — Other Ambulatory Visit: Payer: Self-pay | Admitting: Family Medicine

## 2018-03-19 DIAGNOSIS — M5416 Radiculopathy, lumbar region: Secondary | ICD-10-CM | POA: Diagnosis not present

## 2018-03-19 DIAGNOSIS — R030 Elevated blood-pressure reading, without diagnosis of hypertension: Secondary | ICD-10-CM | POA: Diagnosis not present

## 2018-03-19 DIAGNOSIS — Z6832 Body mass index (BMI) 32.0-32.9, adult: Secondary | ICD-10-CM | POA: Diagnosis not present

## 2018-03-20 DIAGNOSIS — N390 Urinary tract infection, site not specified: Secondary | ICD-10-CM | POA: Diagnosis not present

## 2018-03-20 DIAGNOSIS — R319 Hematuria, unspecified: Secondary | ICD-10-CM | POA: Diagnosis not present

## 2018-03-24 ENCOUNTER — Other Ambulatory Visit: Payer: Self-pay | Admitting: Family Medicine

## 2018-03-31 DIAGNOSIS — M5416 Radiculopathy, lumbar region: Secondary | ICD-10-CM | POA: Diagnosis not present

## 2018-04-01 DIAGNOSIS — M48061 Spinal stenosis, lumbar region without neurogenic claudication: Secondary | ICD-10-CM | POA: Diagnosis not present

## 2018-04-01 DIAGNOSIS — M5416 Radiculopathy, lumbar region: Secondary | ICD-10-CM | POA: Diagnosis not present

## 2018-04-01 DIAGNOSIS — M5126 Other intervertebral disc displacement, lumbar region: Secondary | ICD-10-CM | POA: Diagnosis not present

## 2018-04-07 DIAGNOSIS — Z6832 Body mass index (BMI) 32.0-32.9, adult: Secondary | ICD-10-CM | POA: Diagnosis not present

## 2018-04-07 DIAGNOSIS — M5126 Other intervertebral disc displacement, lumbar region: Secondary | ICD-10-CM | POA: Diagnosis not present

## 2018-04-08 ENCOUNTER — Other Ambulatory Visit: Payer: Self-pay | Admitting: Neurosurgery

## 2018-04-09 ENCOUNTER — Other Ambulatory Visit: Payer: Self-pay | Admitting: Family Medicine

## 2018-04-16 ENCOUNTER — Encounter (HOSPITAL_COMMUNITY): Payer: Self-pay | Admitting: *Deleted

## 2018-04-16 ENCOUNTER — Other Ambulatory Visit: Payer: Self-pay

## 2018-04-16 NOTE — Progress Notes (Signed)
Mrs Theresa Brennan denies chest pain or shortness of breath.  Patient said that she didn't know that she needed to go back to a cardiologist, "I see Dr Darrick Penna."  I explained to her that he is a vascular surgeon, patient's reply, "I didn't know." Mrs Theresa Brennan states that it has been years since she had chest pain and it was due to GERD.  Patient no longer uses Albuterol inhaler.
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
## 2018-04-17 ENCOUNTER — Encounter (HOSPITAL_COMMUNITY)
Admission: RE | Disposition: A | Discharge status: Home / Self Care | Payer: Self-pay | Source: Ambulatory Visit | Attending: Neurosurgery

## 2018-04-17 ENCOUNTER — Encounter (HOSPITAL_COMMUNITY): Payer: Self-pay

## 2018-04-17 ENCOUNTER — Inpatient Hospital Stay (HOSPITAL_COMMUNITY): Payer: Medicare Other | Admitting: Physician Assistant

## 2018-04-17 ENCOUNTER — Inpatient Hospital Stay (HOSPITAL_COMMUNITY)
Admission: RE | Admit: 2018-04-17 | Discharge: 2018-04-18 | DRG: 520 | Disposition: A | Discharge status: Home / Self Care | Payer: Medicare Other | Source: Ambulatory Visit | Attending: Neurosurgery | Admitting: Neurosurgery

## 2018-04-17 ENCOUNTER — Inpatient Hospital Stay (HOSPITAL_COMMUNITY): Payer: Medicare Other

## 2018-04-17 DIAGNOSIS — Z91040 Latex allergy status: Secondary | ICD-10-CM | POA: Diagnosis not present

## 2018-04-17 DIAGNOSIS — M48061 Spinal stenosis, lumbar region without neurogenic claudication: Secondary | ICD-10-CM | POA: Diagnosis not present

## 2018-04-17 DIAGNOSIS — Z87891 Personal history of nicotine dependence: Secondary | ICD-10-CM | POA: Diagnosis not present

## 2018-04-17 DIAGNOSIS — Z8249 Family history of ischemic heart disease and other diseases of the circulatory system: Secondary | ICD-10-CM | POA: Diagnosis not present

## 2018-04-17 DIAGNOSIS — Z885 Allergy status to narcotic agent status: Secondary | ICD-10-CM

## 2018-04-17 DIAGNOSIS — Z79899 Other long term (current) drug therapy: Secondary | ICD-10-CM | POA: Diagnosis not present

## 2018-04-17 DIAGNOSIS — Z9049 Acquired absence of other specified parts of digestive tract: Secondary | ICD-10-CM

## 2018-04-17 DIAGNOSIS — Z7951 Long term (current) use of inhaled steroids: Secondary | ICD-10-CM

## 2018-04-17 DIAGNOSIS — Z9841 Cataract extraction status, right eye: Secondary | ICD-10-CM

## 2018-04-17 DIAGNOSIS — I110 Hypertensive heart disease with heart failure: Secondary | ICD-10-CM | POA: Diagnosis not present

## 2018-04-17 DIAGNOSIS — M5116 Intervertebral disc disorders with radiculopathy, lumbar region: Secondary | ICD-10-CM | POA: Diagnosis not present

## 2018-04-17 DIAGNOSIS — Z90710 Acquired absence of both cervix and uterus: Secondary | ICD-10-CM

## 2018-04-17 DIAGNOSIS — Z7982 Long term (current) use of aspirin: Secondary | ICD-10-CM

## 2018-04-17 DIAGNOSIS — K219 Gastro-esophageal reflux disease without esophagitis: Secondary | ICD-10-CM | POA: Diagnosis not present

## 2018-04-17 DIAGNOSIS — E039 Hypothyroidism, unspecified: Secondary | ICD-10-CM | POA: Diagnosis present

## 2018-04-17 DIAGNOSIS — K449 Diaphragmatic hernia without obstruction or gangrene: Secondary | ICD-10-CM | POA: Diagnosis not present

## 2018-04-17 DIAGNOSIS — M5126 Other intervertebral disc displacement, lumbar region: Secondary | ICD-10-CM | POA: Diagnosis present

## 2018-04-17 DIAGNOSIS — Z9842 Cataract extraction status, left eye: Secondary | ICD-10-CM

## 2018-04-17 DIAGNOSIS — Z79890 Hormone replacement therapy (postmenopausal): Secondary | ICD-10-CM

## 2018-04-17 DIAGNOSIS — Z79891 Long term (current) use of opiate analgesic: Secondary | ICD-10-CM

## 2018-04-17 DIAGNOSIS — Z886 Allergy status to analgesic agent status: Secondary | ICD-10-CM | POA: Diagnosis not present

## 2018-04-17 DIAGNOSIS — Z6832 Body mass index (BMI) 32.0-32.9, adult: Secondary | ICD-10-CM

## 2018-04-17 DIAGNOSIS — Z87440 Personal history of urinary (tract) infections: Secondary | ICD-10-CM

## 2018-04-17 DIAGNOSIS — Z961 Presence of intraocular lens: Secondary | ICD-10-CM | POA: Diagnosis present

## 2018-04-17 DIAGNOSIS — I5032 Chronic diastolic (congestive) heart failure: Secondary | ICD-10-CM | POA: Diagnosis not present

## 2018-04-17 DIAGNOSIS — E669 Obesity, unspecified: Secondary | ICD-10-CM | POA: Diagnosis not present

## 2018-04-17 DIAGNOSIS — Z881 Allergy status to other antibiotic agents status: Secondary | ICD-10-CM

## 2018-04-17 DIAGNOSIS — E785 Hyperlipidemia, unspecified: Secondary | ICD-10-CM | POA: Diagnosis not present

## 2018-04-17 DIAGNOSIS — Z90722 Acquired absence of ovaries, bilateral: Secondary | ICD-10-CM | POA: Diagnosis not present

## 2018-04-17 DIAGNOSIS — Z419 Encounter for procedure for purposes other than remedying health state, unspecified: Secondary | ICD-10-CM

## 2018-04-17 DIAGNOSIS — Z981 Arthrodesis status: Secondary | ICD-10-CM | POA: Diagnosis not present

## 2018-04-17 DIAGNOSIS — Z880 Allergy status to penicillin: Secondary | ICD-10-CM

## 2018-04-17 DIAGNOSIS — I10 Essential (primary) hypertension: Secondary | ICD-10-CM | POA: Diagnosis present

## 2018-04-17 DIAGNOSIS — Z888 Allergy status to other drugs, medicaments and biological substances status: Secondary | ICD-10-CM | POA: Diagnosis not present

## 2018-04-17 HISTORY — PX: LUMBAR LAMINECTOMY/DECOMPRESSION MICRODISCECTOMY: SHX5026

## 2018-04-17 LAB — CBC
HCT: 41.6 % (ref 36.0–46.0)
Hemoglobin: 12.9 g/dL (ref 12.0–15.0)
MCH: 28.6 pg (ref 26.0–34.0)
MCHC: 31 g/dL (ref 30.0–36.0)
MCV: 92.2 fL (ref 78.0–100.0)
PLATELETS: 378 10*3/uL (ref 150–400)
RBC: 4.51 MIL/uL (ref 3.87–5.11)
RDW: 15.9 % — ABNORMAL HIGH (ref 11.5–15.5)
WBC: 11.3 10*3/uL — ABNORMAL HIGH (ref 4.0–10.5)

## 2018-04-17 LAB — BASIC METABOLIC PANEL
Anion gap: 8 (ref 5–15)
BUN: 17 mg/dL (ref 8–23)
CHLORIDE: 103 mmol/L (ref 98–111)
CO2: 30 mmol/L (ref 22–32)
Calcium: 9 mg/dL (ref 8.9–10.3)
Creatinine, Ser: 0.74 mg/dL (ref 0.44–1.00)
GFR calc Af Amer: >60 mL/min (ref >60)
Glucose, Bld: 85 mg/dL (ref 70–99)
POTASSIUM: 3.9 mmol/L (ref 3.5–5.1)
Sodium: 141 mmol/L (ref 135–145)

## 2018-04-17 SURGERY — LUMBAR LAMINECTOMY/DECOMPRESSION MICRODISCECTOMY 1 LEVEL
Anesthesia: General | Site: Spine Lumbar | Laterality: Left

## 2018-04-17 MED ORDER — FENTANYL CITRATE (PF) 100 MCG/2ML IJ SOLN: 25.0000 ug | INTRAMUSCULAR | Status: DC | PRN

## 2018-04-17 MED ORDER — BUPIVACAINE HCL (PF) 0.25 % IJ SOLN
INTRAMUSCULAR | Status: AC
  Filled 2018-04-17: qty 30

## 2018-04-17 MED ORDER — LACTATED RINGERS IV SOLN
INTRAVENOUS | Status: DC
  Administered 2018-04-17: 09:00:00 via INTRAVENOUS

## 2018-04-17 MED ORDER — FENTANYL CITRATE (PF) 100 MCG/2ML IJ SOLN
25.0000 ug | INTRAMUSCULAR | Status: DC | PRN
  Administered 2018-04-17 (×2): 50 ug via INTRAVENOUS

## 2018-04-17 MED ORDER — ALUM & MAG HYDROXIDE-SIMETH 200-200-20 MG/5ML PO SUSP: 30.0000 mL | Freq: Four times a day (QID) | ORAL | Status: DC | PRN

## 2018-04-17 MED ORDER — CYCLOBENZAPRINE HCL 5 MG PO TABS
5.0000 mg | ORAL_TABLET | Freq: Every day | ORAL | Status: DC
  Administered 2018-04-17: 5 mg via ORAL
  Filled 2018-04-17: qty 1

## 2018-04-17 MED ORDER — SUMATRIPTAN SUCCINATE 100 MG PO TABS
100.0000 mg | ORAL_TABLET | ORAL | Status: DC | PRN
  Filled 2018-04-17: qty 1

## 2018-04-17 MED ORDER — TRAMADOL HCL 50 MG PO TABS
50.0000 mg | ORAL_TABLET | Freq: Every day | ORAL | Status: DC
  Administered 2018-04-17 (×2): 50 mg via ORAL
  Filled 2018-04-17: qty 1

## 2018-04-17 MED ORDER — PANTOPRAZOLE SODIUM 40 MG PO TBEC: 40.0000 mg | DELAYED_RELEASE_TABLET | Freq: Every day | ORAL | Status: DC

## 2018-04-17 MED ORDER — ROSUVASTATIN CALCIUM 5 MG PO TABS
10.0000 mg | ORAL_TABLET | Freq: Every day | ORAL | Status: DC
  Administered 2018-04-17: 10 mg via ORAL
  Filled 2018-04-17: qty 2

## 2018-04-17 MED ORDER — THROMBIN 5000 UNITS EX SOLR
CUTANEOUS | Status: AC
  Filled 2018-04-17: qty 10000

## 2018-04-17 MED ORDER — HYDROMORPHONE HCL 1 MG/ML IJ SOLN
0.5000 mg | INTRAMUSCULAR | Status: DC | PRN
  Administered 2018-04-17 – 2018-04-18 (×3): 0.5 mg via INTRAVENOUS
  Filled 2018-04-17 (×3): qty 0.5

## 2018-04-17 MED ORDER — ESTRADIOL 1 MG PO TABS
0.5000 mg | ORAL_TABLET | Freq: Every day | ORAL | Status: DC
  Administered 2018-04-17: 0.5 mg via ORAL
  Filled 2018-04-17 (×2): qty 0.5

## 2018-04-17 MED ORDER — FENTANYL CITRATE (PF) 100 MCG/2ML IJ SOLN
INTRAMUSCULAR | Status: AC
  Filled 2018-04-17: qty 2

## 2018-04-17 MED ORDER — FENTANYL CITRATE (PF) 250 MCG/5ML IJ SOLN
INTRAMUSCULAR | Status: AC
  Filled 2018-04-17: qty 5

## 2018-04-17 MED ORDER — POTASSIUM CHLORIDE CRYS ER 20 MEQ PO TBCR
20.0000 meq | EXTENDED_RELEASE_TABLET | Freq: Two times a day (BID) | ORAL | Status: DC
  Administered 2018-04-17 (×2): 20 meq via ORAL
  Filled 2018-04-17 (×2): qty 1

## 2018-04-17 MED ORDER — ACETAMINOPHEN 650 MG RE SUPP: 650.0000 mg | RECTAL | Status: DC | PRN

## 2018-04-17 MED ORDER — PHENOL 1.4 % MT LIQD: 1.0000 | OROMUCOSAL | Status: DC | PRN

## 2018-04-17 MED ORDER — PROPOFOL 10 MG/ML IV BOLUS
INTRAVENOUS | Status: DC | PRN
  Administered 2018-04-17: 150 mg via INTRAVENOUS

## 2018-04-17 MED ORDER — ALBUTEROL SULFATE (2.5 MG/3ML) 0.083% IN NEBU: 3.0000 mL | INHALATION_SOLUTION | Freq: Four times a day (QID) | RESPIRATORY_TRACT | Status: DC | PRN

## 2018-04-17 MED ORDER — ROCURONIUM BROMIDE 50 MG/5ML IV SOSY
PREFILLED_SYRINGE | INTRAVENOUS | Status: DC | PRN
  Administered 2018-04-17: 40 mg via INTRAVENOUS

## 2018-04-17 MED ORDER — CYCLOBENZAPRINE HCL 10 MG PO TABS
10.0000 mg | ORAL_TABLET | Freq: Three times a day (TID) | ORAL | Status: DC | PRN
  Administered 2018-04-17 – 2018-04-18 (×2): 10 mg via ORAL
  Filled 2018-04-17 (×2): qty 1

## 2018-04-17 MED ORDER — ASPIRIN EC 325 MG PO TBEC
325.0000 mg | DELAYED_RELEASE_TABLET | Freq: Every day | ORAL | Status: DC
  Administered 2018-04-17: 325 mg via ORAL
  Filled 2018-04-17: qty 1

## 2018-04-17 MED ORDER — SODIUM CHLORIDE 0.9% FLUSH: 3.0000 mL | INTRAVENOUS | Status: DC | PRN

## 2018-04-17 MED ORDER — LIDOCAINE 2% (20 MG/ML) 5 ML SYRINGE
INTRAMUSCULAR | Status: DC | PRN
  Administered 2018-04-17: 40 mg via INTRAVENOUS

## 2018-04-17 MED ORDER — SODIUM CHLORIDE 0.9 % IV SOLN
INTRAVENOUS | Status: DC | PRN
  Administered 2018-04-17: 500 mL

## 2018-04-17 MED ORDER — LIDOCAINE-EPINEPHRINE 1 %-1:100000 IJ SOLN
INTRAMUSCULAR | Status: DC | PRN
  Administered 2018-04-17: 10 mL

## 2018-04-17 MED ORDER — LACTATED RINGERS IV SOLN
INTRAVENOUS | Status: DC | PRN
  Administered 2018-04-17: 11:00:00 via INTRAVENOUS

## 2018-04-17 MED ORDER — ONDANSETRON HCL 4 MG/2ML IJ SOLN: 4.0000 mg | Freq: Once | INTRAMUSCULAR | Status: DC | PRN

## 2018-04-17 MED ORDER — 0.9 % SODIUM CHLORIDE (POUR BTL) OPTIME
TOPICAL | Status: DC | PRN
  Administered 2018-04-17: 1000 mL

## 2018-04-17 MED ORDER — TRAMADOL HCL 50 MG PO TABS
ORAL_TABLET | ORAL | Status: AC
  Filled 2018-04-17: qty 1

## 2018-04-17 MED ORDER — MENTHOL 3 MG MT LOZG: 1.0000 | LOZENGE | OROMUCOSAL | Status: DC | PRN

## 2018-04-17 MED ORDER — ACETAMINOPHEN 325 MG PO TABS: 650.0000 mg | ORAL_TABLET | ORAL | Status: DC | PRN

## 2018-04-17 MED ORDER — ADULT MULTIVITAMIN W/MINERALS CH
1.0000 | ORAL_TABLET | Freq: Every day | ORAL | Status: DC
  Administered 2018-04-17: 1 via ORAL
  Filled 2018-04-17: qty 1

## 2018-04-17 MED ORDER — VANCOMYCIN HCL IN DEXTROSE 1-5 GM/200ML-% IV SOLN
1000.0000 mg | Freq: Once | INTRAVENOUS | Status: AC
  Administered 2018-04-17: 1000 mg via INTRAVENOUS
  Filled 2018-04-17: qty 200

## 2018-04-17 MED ORDER — LIDOCAINE-EPINEPHRINE 1 %-1:100000 IJ SOLN
INTRAMUSCULAR | Status: AC
  Filled 2018-04-17: qty 1

## 2018-04-17 MED ORDER — LEVOTHYROXINE SODIUM 75 MCG PO TABS
75.0000 ug | ORAL_TABLET | Freq: Every day | ORAL | Status: DC
  Administered 2018-04-18: 75 ug via ORAL
  Filled 2018-04-17: qty 1

## 2018-04-17 MED ORDER — OXYCODONE HCL 5 MG PO TABS
10.0000 mg | ORAL_TABLET | ORAL | Status: DC | PRN
  Administered 2018-04-17 – 2018-04-18 (×2): 10 mg via ORAL
  Filled 2018-04-17 (×3): qty 2

## 2018-04-17 MED ORDER — FENTANYL CITRATE (PF) 100 MCG/2ML IJ SOLN
INTRAMUSCULAR | Status: DC | PRN
  Administered 2018-04-17 (×3): 50 ug via INTRAVENOUS

## 2018-04-17 MED ORDER — ONDANSETRON 4 MG PO TBDP
4.0000 mg | ORAL_TABLET | Freq: Three times a day (TID) | ORAL | Status: DC | PRN
  Filled 2018-04-17: qty 1

## 2018-04-17 MED ORDER — HEMOSTATIC AGENTS (NO CHARGE) OPTIME
TOPICAL | Status: DC | PRN
  Administered 2018-04-17: 1 via TOPICAL

## 2018-04-17 MED ORDER — PROPOFOL 10 MG/ML IV BOLUS
INTRAVENOUS | Status: AC
  Filled 2018-04-17: qty 20

## 2018-04-17 MED ORDER — ONDANSETRON HCL 4 MG/2ML IJ SOLN
INTRAMUSCULAR | Status: DC | PRN
  Administered 2018-04-17: 4 mg via INTRAVENOUS

## 2018-04-17 MED ORDER — SUGAMMADEX SODIUM 200 MG/2ML IV SOLN
INTRAVENOUS | Status: DC | PRN
  Administered 2018-04-17: 200 mg via INTRAVENOUS

## 2018-04-17 MED ORDER — ONDANSETRON HCL 4 MG PO TABS: 4.0000 mg | ORAL_TABLET | Freq: Four times a day (QID) | ORAL | Status: DC | PRN

## 2018-04-17 MED ORDER — THROMBIN 5000 UNITS EX SOLR
CUTANEOUS | Status: DC | PRN
  Administered 2018-04-17 (×2): 5000 [IU] via TOPICAL

## 2018-04-17 MED ORDER — SODIUM CHLORIDE 0.9% FLUSH
3.0000 mL | Freq: Two times a day (BID) | INTRAVENOUS | Status: DC
  Administered 2018-04-17: 3 mL via INTRAVENOUS

## 2018-04-17 MED ORDER — WOMENS MULTIVITAMIN PLUS PO TABS: 1.0000 | ORAL_TABLET | Freq: Every day | ORAL | Status: DC

## 2018-04-17 MED ORDER — PANTOPRAZOLE SODIUM 40 MG PO TBEC
80.0000 mg | DELAYED_RELEASE_TABLET | Freq: Every day | ORAL | Status: DC
  Administered 2018-04-17: 80 mg via ORAL
  Filled 2018-04-17: qty 2

## 2018-04-17 MED ORDER — TRIAMTERENE-HCTZ 37.5-25 MG PO TABS
1.0000 | ORAL_TABLET | Freq: Every day | ORAL | Status: DC
  Administered 2018-04-17: 1 via ORAL
  Filled 2018-04-17 (×2): qty 1

## 2018-04-17 MED ORDER — DEXAMETHASONE SODIUM PHOSPHATE 10 MG/ML IJ SOLN
INTRAMUSCULAR | Status: DC | PRN
  Administered 2018-04-17: 10 mg via INTRAVENOUS

## 2018-04-17 MED ORDER — ONDANSETRON HCL 4 MG/2ML IJ SOLN: 4.0000 mg | Freq: Four times a day (QID) | INTRAMUSCULAR | Status: DC | PRN

## 2018-04-17 SURGICAL SUPPLY — 55 items
BAG DECANTER FOR FLEXI CONT (MISCELLANEOUS) ×2 IMPLANT
BENZOIN TINCTURE PRP APPL 2/3 (GAUZE/BANDAGES/DRESSINGS) ×2 IMPLANT
BLADE CLIPPER SURG (BLADE) IMPLANT
BLADE SURG 11 STRL SS (BLADE) ×2 IMPLANT
BUR CUTTER 7.0 ROUND (BURR) ×2 IMPLANT
BUR MATCHSTICK NEURO 3.0 LAGG (BURR) ×2 IMPLANT
CANISTER SUCT 3000ML PPV (MISCELLANEOUS) ×2 IMPLANT
CARTRIDGE OIL MAESTRO DRILL (MISCELLANEOUS) ×1 IMPLANT
DECANTER SPIKE VIAL GLASS SM (MISCELLANEOUS) ×2 IMPLANT
DERMABOND ADVANCED (GAUZE/BANDAGES/DRESSINGS) ×1
DERMABOND ADVANCED .7 DNX12 (GAUZE/BANDAGES/DRESSINGS) ×1 IMPLANT
DIFFUSER DRILL AIR PNEUMATIC (MISCELLANEOUS) ×2 IMPLANT
DRAPE HALF SHEET 40X57 (DRAPES) IMPLANT
DRAPE LAPAROTOMY 100X72X124 (DRAPES) ×2 IMPLANT
DRAPE MICROSCOPE LEICA (MISCELLANEOUS) ×2 IMPLANT
DRAPE SURG 17X23 STRL (DRAPES) ×2 IMPLANT
DRSG OPSITE POSTOP 3X4 (GAUZE/BANDAGES/DRESSINGS) ×2 IMPLANT
DURAPREP 26ML APPLICATOR (WOUND CARE) ×2 IMPLANT
ELECT REM PT RETURN 9FT ADLT (ELECTROSURGICAL) ×2
ELECTRODE REM PT RTRN 9FT ADLT (ELECTROSURGICAL) ×1 IMPLANT
GAUZE 4X4 16PLY RFD (DISPOSABLE) IMPLANT
GAUZE SPONGE 4X4 12PLY STRL (GAUZE/BANDAGES/DRESSINGS) IMPLANT
GLOVE BIO SURGEON STRL SZ7 (GLOVE) ×2 IMPLANT
GLOVE BIO SURGEON STRL SZ8 (GLOVE) ×2 IMPLANT
GLOVE BIOGEL PI IND STRL 6.5 (GLOVE) ×1 IMPLANT
GLOVE BIOGEL PI IND STRL 7.0 (GLOVE) ×1 IMPLANT
GLOVE BIOGEL PI INDICATOR 6.5 (GLOVE) ×1
GLOVE BIOGEL PI INDICATOR 7.0 (GLOVE) ×1
GLOVE EXAM NITRILE LRG STRL (GLOVE) IMPLANT
GLOVE EXAM NITRILE XL STR (GLOVE) IMPLANT
GLOVE EXAM NITRILE XS STR PU (GLOVE) IMPLANT
GLOVE INDICATOR 8.5 STRL (GLOVE) ×2 IMPLANT
GLOVE SURG SS PI 6.0 STRL IVOR (GLOVE) ×6 IMPLANT
GOWN STRL REUS W/ TWL LRG LVL3 (GOWN DISPOSABLE) ×2 IMPLANT
GOWN STRL REUS W/ TWL XL LVL3 (GOWN DISPOSABLE) ×2 IMPLANT
GOWN STRL REUS W/TWL 2XL LVL3 (GOWN DISPOSABLE) IMPLANT
GOWN STRL REUS W/TWL LRG LVL3 (GOWN DISPOSABLE) ×2
GOWN STRL REUS W/TWL XL LVL3 (GOWN DISPOSABLE) ×2
KIT BASIN OR (CUSTOM PROCEDURE TRAY) ×2 IMPLANT
KIT TURNOVER KIT B (KITS) ×2 IMPLANT
NEEDLE HYPO 22GX1.5 SAFETY (NEEDLE) ×2 IMPLANT
NEEDLE SPNL 22GX3.5 QUINCKE BK (NEEDLE) ×2 IMPLANT
NS IRRIG 1000ML POUR BTL (IV SOLUTION) ×2 IMPLANT
OIL CARTRIDGE MAESTRO DRILL (MISCELLANEOUS) ×2
PACK LAMINECTOMY NEURO (CUSTOM PROCEDURE TRAY) ×2 IMPLANT
RUBBERBAND STERILE (MISCELLANEOUS) ×4 IMPLANT
SPONGE SURGIFOAM ABS GEL SZ50 (HEMOSTASIS) ×2 IMPLANT
STRIP CLOSURE SKIN 1/2X4 (GAUZE/BANDAGES/DRESSINGS) ×2 IMPLANT
SUT VIC AB 0 CT1 18XCR BRD8 (SUTURE) ×1 IMPLANT
SUT VIC AB 0 CT1 8-18 (SUTURE) ×1
SUT VIC AB 2-0 CT1 18 (SUTURE) ×2 IMPLANT
SUT VICRYL 4-0 PS2 18IN ABS (SUTURE) ×2 IMPLANT
TOWEL GREEN STERILE (TOWEL DISPOSABLE) ×2 IMPLANT
TOWEL GREEN STERILE FF (TOWEL DISPOSABLE) ×2 IMPLANT
WATER STERILE IRR 1000ML POUR (IV SOLUTION) ×2 IMPLANT

## 2018-04-17 NOTE — Anesthesia Postprocedure Evaluation (Signed)
Anesthesia Post Note  Patient: Theresa Brennan  Procedure(s) Performed: Microdiscectomy - left - Lumbar Four-Five- extraforaminal (Left Spine Lumbar)     Patient location during evaluation: PACU Anesthesia Type: General Level of consciousness: awake and alert Pain management: pain level controlled Vital Signs Assessment: post-procedure vital signs reviewed and stable Respiratory status: spontaneous breathing, nonlabored ventilation, respiratory function stable and patient connected to nasal cannula oxygen Cardiovascular status: blood pressure returned to baseline and stable Postop Assessment: no apparent nausea or vomiting Anesthetic complications: no    Last Vitals:  Vitals:   04/17/18 1346 04/17/18 1700  BP: (!) 145/74 (!) 100/40  Pulse: 83 84  Resp: 18 18  Temp: 36.6 C 36.8 C  SpO2: 97% 94%    Last Pain:  Vitals:   04/17/18 1700  TempSrc: Oral  PainSc:                  [name]

## 2018-04-17 NOTE — Op Note (Signed)
Preoperative diagnosis: Left L4 radiculopathy from large herniated nucleus pulposus extraforaminal L4-5 left   postoperative diagnosis: Same  Procedure extraforaminal approach for microdiscectomy L4-5 on the left with microdissection of the left L4 nerve root and microscopic discectomy  Surgeon: Donalee Citrin  Assistant: Verlin Dike  Anesthesia: General  EBL: Minimal  HPI: 72 year old female with previous history of microdiscectomy did very well however the last several weeks months is a progressive worsening back left hip and leg pain rating down lateral thigh front of her shin consistent with an L4 nerve root pattern.  Work-up revealed a large extra foraminal disc herniation with severe stenosis in the extraconal space with compression of the left L4 nerve root.  Due to her progression of clinical syndrome imaging findings and failed conservative treatment I recommended extraforaminal microdiscectomy L4-5 in the left with decompression of the left L4 nerve root.  I extensively went over the risks and benefits of the operation with her as well as perioperative course expectations of outcome and alternatives of surgery and she understood and agreed to proceed forward.  Operative procedure: Patient brought into the ER was induced and general anesthesia positioned prone Wilson frame her back was prepped and draped in routine sterile fashion preoperative x-ray localized the appropriate levels after infiltration of 10 cc lidocaine with epi midline incision was made and Bovie elect cautery was used to take down substance history and subperiosteal dissection was carried out in the lamina of L4 and pars at L4-5 and the superior aspect of the 4 5 facet inferior aspect L3-4 facet.  Then intraoperative x-ray confirms education appropriate level so the inferior aspect of 3 4 facet lateral pars and superior aspect 4 5 facet was drilled out with a high-speed drill and there was a large spur coming off the 4 5  facet projecting into the actual femoral space this was thinned out drilled away removed in piecemeal fashion with a 2 with a Kerrison punch.  The intertransverse ligament was identified removed a piece of fashion identified the L4 nerve root.  Under microscopic illumination the L4 nerve was dissected off a very large disc herniation still partially contained with the ligament.  Ligament was incised with an 11 blade scalpel and click discus was cleaned out pituitary rongeurs.  At the end of the discectomy and foraminotomy there is no further stenosis on the left L4 nerve root.  There was a copious irrigated to Kassim states was maintained Gelfoam was opened up the dura the muscle fascia proximally layers with empty Vicryl skin was closed running 4 subcuticular Dermabond benzoin Steri-Strips and a steroid ointment was applied patient recovery in stable condition.  At the end the case on the account sponge counts were correct.

## 2018-04-17 NOTE — Anesthesia Preprocedure Evaluation (Addendum)
Anesthesia Evaluation  Patient identified by MRN, date of birth, ID band Patient awake    Reviewed: Allergy & Precautions, H&P , NPO status , Patient's Chart, lab work & pertinent test results  History of Anesthesia Complications (+) PONV and history of anesthetic complications  Airway Mallampati: II  TM Distance: >3 FB Neck ROM: Full    Dental  (+) Edentulous Upper, Edentulous Lower   Pulmonary former smoker,    breath sounds clear to auscultation       Cardiovascular hypertension,  Rhythm:Regular Rate:Normal     Neuro/Psych  Headaches,    GI/Hepatic hiatal hernia, GERD  Medicated and Controlled,  Endo/Other  Hypothyroidism   Renal/GU      Musculoskeletal   Abdominal (+) - obese,   Peds  Hematology   Anesthesia Other Findings   Reproductive/Obstetrics                           Anesthesia Physical Anesthesia Plan  ASA: III  Anesthesia Plan: General   Post-op Pain Management:    Induction: Intravenous  PONV Risk Score and Plan: Ondansetron and Dexamethasone  Airway Management Planned: Oral ETT  Additional Equipment:   Intra-op Plan:   Post-operative Plan: Extubation in OR  Informed Consent: I have reviewed the patients History and Physical, chart, labs and discussed the procedure including the risks, benefits and alternatives for the proposed anesthesia with the patient or authorized representative who has indicated his/her understanding and acceptance.   Dental advisory given and Dental Advisory Given  Plan Discussed with: CRNA and Anesthesiologist  Anesthesia Plan Comments:        Anesthesia Quick Evaluation

## 2018-04-17 NOTE — Transfer of Care (Signed)
Immediate Anesthesia Transfer of Care Note  Patient: Theresa Brennan  Procedure(s) Performed: Microdiscectomy - left - Lumbar Four-Five- extraforaminal (Left Spine Lumbar)  Patient Location: PACU  Anesthesia Type:General  Level of Consciousness: awake, alert  and oriented  Airway & Oxygen Therapy: Patient Spontanous Breathing and Patient connected to nasal cannula oxygen  Post-op Assessment: Report given to RN, Post -op Vital signs reviewed and stable and Patient moving all extremities X 4  Post vital signs: Reviewed and stable  Last Vitals:  Vitals Value Taken Time  BP    Temp    Pulse 89 04/17/2018 12:24 PM  Resp 11 04/17/2018 12:24 PM  SpO2 100 % 04/17/2018 12:24 PM  Vitals shown include unvalidated device data.  Last Pain:  Vitals:   04/17/18 0918  TempSrc:   PainSc: 7       Patients Stated Pain Goal: 0 (04/17/18 7867)  Complications: No apparent anesthesia complications

## 2018-04-17 NOTE — Progress Notes (Signed)
Pharmacy Antibiotic Note  Theresa Brennan is a 72 y.o. female admitted on 04/17/2018 for planned spinal surgery.  Pharmacy has been consulted for Vancomycin dosing post-op.  Per discussion with RN - the patient does NOT have a drain in place. SCr 0.74, CrCl~55-60 ml/min.   Pre-op Vancomycin dose given at 1030  Plan: - Vancomycin 1g x 1 dose post-op at 2230 - Pharmacy will sign off as no further doses expected at this time  Weight: 166 lb 10.7 oz (75.6 kg)(5.2.19)  Temp (24hrs), Avg:97.9 F (36.6 C), Min:97.7 F (36.5 C), Max:98 F (36.7 C)  Recent Labs  Lab 04/17/18 0929  WBC 11.3*  CREATININE 0.74    Estimated Creatinine Clearance: 57.7 mL/min (by C-G formula based on SCr of 0.74 mg/dL).    Allergies  Allergen Reactions  . Amoxicillin Swelling    SWELLING REACTION UNSPECIFIED  [Denies allergy but Significant Reactions to PCN's]  . Meloxicam Nausea Only, Rash and Other (See Comments)    Rebound Headache  . Metoprolol Other (See Comments)    Feet and leg pain/cramp ([place]?)  . Morphine Sulfate Nausea And Vomiting and Other (See Comments)    Severe Rebound Headache  . Penicillins Swelling, Rash and Other (See Comments)    Blisters in mouth and red tongue PATIENT HAS HAD A PCN REACTION WITH IMMEDIATE RASH, FACIAL/TONGUE/THROAT SWELLING, SOB, OR LIGHTHEADEDNESS WITH HYPOTENSION:  #  #  YES  #  #  Has patient had a PCN reaction causing severe rash involving mucus membranes or skin necrosis: No Has patient had a PCN reaction that required hospitalization: No Has patient had a PCN reaction occurring within the last 10 years: No   . Naproxen     UNSPECIFIED REACTION   . Tylenol Arthritis Ext [Acetaminophen] Other (See Comments)    Headache   (only tylenol arthritis)    Patient states she can take tylenol  . Latex Itching    Denies allergy    Thank you for allowing pharmacy to be a part of this patient's care.  Georgina Pillion, PharmD, BCPS Pager: [phone] 1:47 PM

## 2018-04-17 NOTE — Anesthesia Procedure Notes (Signed)
Procedure Name: Intubation Date/Time: 04/17/2018 10:59 AM Performed by: Carmela Rima, CRNA Pre-anesthesia Checklist: Timeout performed, Patient being monitored, Suction available, Emergency Drugs available and Patient identified Patient Re-evaluated:Patient Re-evaluated prior to induction Oxygen Delivery Method: Circle system utilized Preoxygenation: Pre-oxygenation with 100% oxygen Induction Type: IV induction Ventilation: Mask ventilation without difficulty Laryngoscope Size: Miller and 2 Grade View: Grade I Tube type: Oral Tube size: 7.0 mm Number of attempts: 1 Placement Confirmation: breath sounds checked- equal and bilateral,  positive ETCO2 and ETT inserted through vocal cords under direct vision Secured at: 21 cm Tube secured with: Tape Dental Injury: Teeth and Oropharynx as per pre-operative assessment
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
## 2018-04-17 NOTE — Plan of Care (Signed)
  Problem: Safety: Goal: Ability to remain free from injury will improve Outcome: Progressing   

## 2018-04-18 ENCOUNTER — Encounter (HOSPITAL_COMMUNITY): Payer: Self-pay | Admitting: Neurosurgery

## 2018-04-18 MED ORDER — HYDROCODONE-ACETAMINOPHEN 5-325 MG PO TABS: 1.0000 | ORAL_TABLET | ORAL | 0 refills | Status: DC | PRN

## 2018-04-18 MED ORDER — METHOCARBAMOL 500 MG PO TABS: 500.0000 mg | ORAL_TABLET | Freq: Four times a day (QID) | ORAL | 0 refills | Status: DC

## 2018-04-18 NOTE — Progress Notes (Signed)
Patient is discharged from room 3C11 at this time. Alert and in stable condition. IV site d/c'd and instructions read to patient and spouse with understanding verbalized. Left unit via wheelchair with all belongings at side. 

## 2018-04-18 NOTE — Discharge Summary (Signed)
Physician Discharge Summary  Patient ID: Theresa Brennan MRN: [id number] DOB/AGE: 01-29-1946 72 y.o.  Admit date: 04/17/2018 Discharge date: 04/18/2018  Admission Diagnoses: Left L4 radiculopathy from large herniated nucleus pulposus extraforaminal L4-5 left    Discharge Diagnoses: same   Discharged Condition: good  Hospital Course: The patient was admitted on 04/17/2018 and taken to the operating room where the patient underwent microdiscectomy l4-5. The patient tolerated the procedure well and was taken to the recovery room and then to the floor in stable condition. The hospital course was routine. There were no complications. The wound remained clean dry and intact. Pt had appropriate back soreness. No complaints of leg pain or new N/T/W. The patient remained afebrile with stable vital signs, and tolerated a regular diet. The patient continued to increase activities, and pain was well controlled with oral pain medications.   Consults: None  Significant Diagnostic Studies:  Results for orders placed or performed during the hospital encounter of 04/17/18  CBC  Result Value Ref Range   WBC 11.3 (H) 4.0 - 10.5 K/uL   RBC 4.51 3.87 - 5.11 MIL/uL   Hemoglobin 12.9 12.0 - 15.0 g/dL   HCT 65.4 65.0 - 35.4 %   MCV 92.2 78.0 - 100.0 fL   MCH 28.6 26.0 - 34.0 pg   MCHC 31.0 30.0 - 36.0 g/dL   RDW 65.6 (H) 81.2 - 75.1 %   Platelets 378 150 - 400 K/uL  Basic metabolic panel  Result Value Ref Range   Sodium 141 135 - 145 mmol/L   Potassium 3.9 3.5 - 5.1 mmol/L   Chloride 103 98 - 111 mmol/L   CO2 30 22 - 32 mmol/L   Glucose, Bld 85 70 - 99 mg/dL   BUN 17 8 - 23 mg/dL   Creatinine, Ser 7.00 0.44 - 1.00 mg/dL   Calcium 9.0 8.9 - 17.4 mg/dL   GFR calc non Af Amer >60 >60 mL/min   GFR calc Af Amer >60 >60 mL/min   Anion gap 8 5 - 15    Dg Lumbar Spine 2-3 Views  Result Date: 04/17/2018 CLINICAL DATA:  Micro discectomy. EXAM: LUMBAR SPINE - 2-3 VIEW COMPARISON:  Lumbar spine 04/01/2018.  FINDINGS: Lumbar spine numbered as per prior MRI. On image number 2 metallic marker noted posteriorly at the L4 level. Surgical equipment and a surgical sponge noted at this level. Diffuse multilevel degenerative change lumbar spine. No acute bony abnormality identified. No evidence of fracture dislocation. IMPRESSION: On image number 2 metallic marker noted posteriorly at the L4 level. Electronically Signed   By: Maisie Fus  Register   On: 04/17/2018 13:01    Antibiotics:  Anti-infectives (From admission, onward)   Start     Dose/Rate Route Frequency Ordered Stop   04/17/18 2230  vancomycin (VANCOCIN) IVPB 1000 mg/200 mL premix     1,000 mg 200 mL/hr over 60 Minutes Intravenous  Once 04/17/18 1348 04/18/18 0746   04/17/18 1134  bacitracin 50,000 Units in sodium chloride 0.9 % 500 mL irrigation  Status:  Discontinued       As needed 04/17/18 1134 04/17/18 1220   04/17/18 0945  vancomycin (VANCOCIN) IVPB 1000 mg/200 mL premix     1,000 mg 200 mL/hr over 60 Minutes Intravenous  Once 04/17/18 0942 04/17/18 1420      Discharge Exam: Blood pressure 132/74, pulse 85, temperature 97.8 F (36.6 C), temperature source Oral, resp. rate (!) 28, height 5' (1.524 m), weight 75.6 kg, SpO2 98 %. Neurologic: Grossly  normal Ambulating and voiding well  Discharge Medications:   Allergies as of 04/18/2018      Reactions   Amoxicillin Swelling   SWELLING REACTION UNSPECIFIED  [Denies allergy but Significant Reactions to PCN's]   Meloxicam Nausea Only, Rash, Other (See Comments)   Rebound Headache   Metoprolol Other (See Comments)   Feet and leg pain/cramp ([place]?)   Morphine Sulfate Nausea And Vomiting, Other (See Comments)   Severe Rebound Headache   Penicillins Swelling, Rash, Other (See Comments)   Blisters in mouth and red tongue PATIENT HAS HAD A PCN REACTION WITH IMMEDIATE RASH, FACIAL/TONGUE/THROAT SWELLING, SOB, OR LIGHTHEADEDNESS WITH HYPOTENSION:  #  #  YES  #  #  Has patient had a PCN  reaction causing severe rash involving mucus membranes or skin necrosis: No Has patient had a PCN reaction that required hospitalization: No Has patient had a PCN reaction occurring within the last 10 years: No   Naproxen    UNSPECIFIED REACTION    Tylenol Arthritis Ext [acetaminophen] Other (See Comments)   Headache   (only tylenol arthritis)    Patient states she can take tylenol   Latex Itching   Denies allergy      Medication List    TAKE these medications   albuterol 108 (90 Base) MCG/ACT inhaler Commonly known as:  PROVENTIL HFA;VENTOLIN HFA Inhale 2 puffs into the lungs every 6 (six) hours as needed for wheezing or shortness of breath.   aspirin EC 325 MG tablet Take 1 tablet (325 mg total) by mouth daily. What changed:  when to take this   cyclobenzaprine 5 MG tablet Commonly known as:  FLEXERIL TAKE 1 TABLET BY MOUTH THREE TIMES DAILY AS NEEDED FOR  MUSCLE  SPASM What changed:    when to take this  additional instructions   doxycycline 100 MG tablet Commonly known as:  VIBRA-TABS Take 1 tablet (100 mg total) by mouth 2 (two) times daily.   ESTRACE 0.5 MG tablet Generic drug:  estradiol Take 0.5 mg by mouth daily.   fluticasone 50 MCG/ACT nasal spray Commonly known as:  FLONASE Place 2 sprays into both nostrils daily.   gabapentin 100 MG capsule Commonly known as:  NEURONTIN Take 1 capsule (100 mg total) by mouth 3 (three) times daily.   HYDROcodone-acetaminophen 5-325 MG tablet Commonly known as:  NORCO/VICODIN Take 1 tablet by mouth every 4 (four) hours as needed for moderate pain.   levothyroxine 75 MCG tablet Commonly known as:  SYNTHROID, LEVOTHROID TAKE 1 TABLET BY MOUTH ONCE DAILY   methocarbamol 500 MG tablet Commonly known as:  ROBAXIN Take 1 tablet (500 mg total) by mouth 4 (four) times daily.   omeprazole 40 MG capsule Commonly known as:  PRILOSEC Take 1 tablet by mouth once to twice daily   ondansetron 4 MG disintegrating  tablet Commonly known as:  ZOFRAN-ODT Take 1 tablet (4 mg total) by mouth every 8 (eight) hours as needed for nausea or vomiting.   potassium chloride SA 20 MEQ tablet Commonly known as:  K-DUR,KLOR-CON TAKE 1 TABLET BY MOUTH TWICE DAILY   rosuvastatin 10 MG tablet Commonly known as:  CRESTOR TAKE 1 TABLET BY MOUTH ONCE DAILY   SUMAtriptan 100 MG tablet Commonly known as:  IMITREX TAKE 1 TABLET BY MOUTH ONCE DAILY AS  DIRECTED  BY  MIGRAINE What changed:  See the new instructions.   traMADol 50 MG tablet Commonly known as:  ULTRAM Take 1 tablet (50 mg total) by mouth every  6 (six) hours as needed for severe pain. OK to refill after 30 days What changed:  when to take this   triamterene-hydrochlorothiazide 37.5-25 MG capsule Commonly known as:  DYAZIDE TAKE 1 CAPSULE BY MOUTH ONCE DAILY   WOMENS MULTIVITAMIN PLUS Tabs Take 1 tablet by mouth at bedtime.       Disposition: home   Final Dx: microdiscectomy l4-5  Discharge Instructions     Remove dressing in 72 hours   Complete by:  As directed    Call MD for:  difficulty breathing, headache or visual disturbances   Complete by:  As directed    Call MD for:  hives   Complete by:  As directed    Call MD for:  persistant dizziness or light-headedness   Complete by:  As directed    Call MD for:  persistant nausea and vomiting   Complete by:  As directed    Call MD for:  redness, tenderness, or signs of infection (pain, swelling, redness, odor or green/yellow discharge around incision site)   Complete by:  As directed    Call MD for:  severe uncontrolled pain   Complete by:  As directed    Call MD for:  temperature >100.4   Complete by:  As directed    Diet - low sodium heart healthy   Complete by:  As directed    Driving Restrictions   Complete by:  As directed    No driving 2 weeks   Increase activity slowly   Complete by:  As directed    Lifting restrictions   Complete by:  As directed    No lfiting more than 8  lbs         Signed: Tiana Loft Torri Langston 04/18/2018, 8:41 AM

## 2018-04-18 NOTE — Discharge Instructions (Signed)

## 2018-04-30 ENCOUNTER — Telehealth: Payer: Self-pay | Admitting: Family Medicine

## 2018-04-30 DIAGNOSIS — N3000 Acute cystitis without hematuria: Secondary | ICD-10-CM

## 2018-04-30 MED ORDER — CEPHALEXIN 500 MG PO CAPS: 500.0000 mg | ORAL_CAPSULE | Freq: Four times a day (QID) | ORAL | 0 refills | Status: DC

## 2018-04-30 NOTE — Telephone Encounter (Signed)
Called and confirmed nurse history.  She does have urgency and frequency and dysuria.  Also C/O back pain.  She has had both bladder and kidney infections before. She has a severe allergic reaction to penicillins, but looking through her med history, she has safely taken cephelexin multiple times in the past few years without problems.  Given classic symptoms and difficulty with transportation with her recent surg, I will call in keflex Rx at [facility].  Told she will need to be seen if worsens.

## 2018-04-30 NOTE — Telephone Encounter (Signed)
Pt would like to know if Dr. Leveda Anna could call her in an antibiotic for a bladder infection. She says she has no way to come into the office to be seen due to just having a surgery on her back. She says she is in severe pain and needs something as soon as possible. She would like for someone to call her and let her know if something is sent to her pharmacy.

## 2018-05-09 DIAGNOSIS — R300 Dysuria: Secondary | ICD-10-CM | POA: Diagnosis not present

## 2018-06-04 ENCOUNTER — Ambulatory Visit: Payer: Medicare Other

## 2018-06-18 ENCOUNTER — Ambulatory Visit: Payer: Medicare Other | Admitting: Family

## 2018-06-18 ENCOUNTER — Ambulatory Visit (HOSPITAL_COMMUNITY): Payer: Medicare Other

## 2018-06-28 DIAGNOSIS — R309 Painful micturition, unspecified: Secondary | ICD-10-CM | POA: Diagnosis not present

## 2018-06-28 DIAGNOSIS — N390 Urinary tract infection, site not specified: Secondary | ICD-10-CM | POA: Diagnosis not present

## 2018-07-03 DIAGNOSIS — M545 Low back pain: Secondary | ICD-10-CM | POA: Diagnosis not present

## 2018-07-07 DIAGNOSIS — M545 Low back pain: Secondary | ICD-10-CM | POA: Diagnosis not present

## 2018-07-09 DIAGNOSIS — M545 Low back pain: Secondary | ICD-10-CM | POA: Diagnosis not present

## 2018-07-10 ENCOUNTER — Other Ambulatory Visit: Payer: Self-pay | Admitting: Gastroenterology

## 2018-07-13 ENCOUNTER — Other Ambulatory Visit: Payer: Self-pay | Admitting: Family Medicine

## 2018-07-13 DIAGNOSIS — M545 Low back pain: Secondary | ICD-10-CM | POA: Diagnosis not present

## 2018-07-21 DIAGNOSIS — M545 Low back pain: Secondary | ICD-10-CM | POA: Diagnosis not present

## 2018-07-29 DIAGNOSIS — J320 Chronic maxillary sinusitis: Secondary | ICD-10-CM | POA: Diagnosis not present

## 2018-07-29 DIAGNOSIS — R05 Cough: Secondary | ICD-10-CM | POA: Diagnosis not present

## 2018-07-29 DIAGNOSIS — H8111 Benign paroxysmal vertigo, right ear: Secondary | ICD-10-CM | POA: Diagnosis not present

## 2018-07-29 DIAGNOSIS — J370 Chronic laryngitis: Secondary | ICD-10-CM | POA: Diagnosis not present

## 2018-07-29 DIAGNOSIS — H7011 Chronic mastoiditis, right ear: Secondary | ICD-10-CM | POA: Diagnosis not present

## 2018-07-29 DIAGNOSIS — J322 Chronic ethmoidal sinusitis: Secondary | ICD-10-CM | POA: Diagnosis not present

## 2018-07-29 DIAGNOSIS — H9311 Tinnitus, right ear: Secondary | ICD-10-CM | POA: Diagnosis not present

## 2018-08-04 ENCOUNTER — Ambulatory Visit: Payer: Medicare Other | Admitting: Family

## 2018-08-04 ENCOUNTER — Inpatient Hospital Stay (HOSPITAL_COMMUNITY): Admission: RE | Admit: 2018-08-04 | Payer: Medicare Other | Source: Ambulatory Visit

## 2018-08-04 DIAGNOSIS — J039 Acute tonsillitis, unspecified: Secondary | ICD-10-CM | POA: Diagnosis not present

## 2018-08-04 DIAGNOSIS — J370 Chronic laryngitis: Secondary | ICD-10-CM | POA: Diagnosis not present

## 2018-08-04 DIAGNOSIS — H7011 Chronic mastoiditis, right ear: Secondary | ICD-10-CM | POA: Diagnosis not present

## 2018-08-04 DIAGNOSIS — J322 Chronic ethmoidal sinusitis: Secondary | ICD-10-CM | POA: Diagnosis not present

## 2018-08-04 DIAGNOSIS — J320 Chronic maxillary sinusitis: Secondary | ICD-10-CM | POA: Diagnosis not present

## 2018-08-04 DIAGNOSIS — H6121 Impacted cerumen, right ear: Secondary | ICD-10-CM | POA: Diagnosis not present

## 2018-08-10 DIAGNOSIS — M545 Low back pain: Secondary | ICD-10-CM | POA: Diagnosis not present

## 2018-08-13 DIAGNOSIS — M545 Low back pain: Secondary | ICD-10-CM | POA: Diagnosis not present

## 2018-08-18 ENCOUNTER — Other Ambulatory Visit: Payer: Self-pay | Admitting: Family Medicine

## 2018-09-01 DIAGNOSIS — M545 Low back pain: Secondary | ICD-10-CM | POA: Diagnosis not present

## 2018-09-03 DIAGNOSIS — M545 Low back pain: Secondary | ICD-10-CM | POA: Diagnosis not present

## 2018-09-07 DIAGNOSIS — M545 Low back pain: Secondary | ICD-10-CM | POA: Diagnosis not present

## 2018-09-08 ENCOUNTER — Other Ambulatory Visit: Payer: Self-pay | Admitting: Family Medicine

## 2018-09-14 ENCOUNTER — Other Ambulatory Visit: Payer: Self-pay | Admitting: Family Medicine

## 2018-09-14 DIAGNOSIS — I10 Essential (primary) hypertension: Secondary | ICD-10-CM

## 2018-09-14 DIAGNOSIS — R300 Dysuria: Secondary | ICD-10-CM | POA: Diagnosis not present

## 2018-09-15 DIAGNOSIS — M545 Low back pain: Secondary | ICD-10-CM | POA: Diagnosis not present

## 2018-09-17 DIAGNOSIS — M545 Low back pain: Secondary | ICD-10-CM | POA: Diagnosis not present

## 2018-09-22 DIAGNOSIS — M545 Low back pain: Secondary | ICD-10-CM | POA: Diagnosis not present

## 2018-09-29 DIAGNOSIS — M545 Low back pain: Secondary | ICD-10-CM | POA: Diagnosis not present

## 2018-10-06 DIAGNOSIS — M7582 Other shoulder lesions, left shoulder: Secondary | ICD-10-CM | POA: Diagnosis not present

## 2018-10-06 DIAGNOSIS — M25512 Pain in left shoulder: Secondary | ICD-10-CM | POA: Diagnosis not present

## 2018-10-19 ENCOUNTER — Telehealth: Payer: Self-pay

## 2018-10-19 MED ORDER — LORAZEPAM 0.5 MG PO TABS: 0.5000 mg | ORAL_TABLET | Freq: Three times a day (TID) | ORAL | 0 refills | Status: DC | PRN

## 2018-10-19 NOTE — Telephone Encounter (Signed)
Patient's husband died Friday of what sounds like Sudden Cardiac Death Syndrome.  Clearly unexpected.  Sent in small Rx for lorazepam.

## 2018-10-19 NOTE — Telephone Encounter (Signed)
Toni Amend, patients grand daughter, called nurse line stating the patient recently lost her husband, abruptly on Friday. Pt is requesting something to be called in for "sleep" and "nerves." Pts granddaughter stated this week is the funeral and will be really hard on her, as she is not taking things well at this time. Please advise.   Meds to be sent to [facility] in [place].   Toni Amend can be reached with any questions [phone]

## 2018-10-29 ENCOUNTER — Other Ambulatory Visit: Payer: Self-pay

## 2018-10-29 ENCOUNTER — Ambulatory Visit (INDEPENDENT_AMBULATORY_CARE_PROVIDER_SITE_OTHER): Payer: Medicare Other | Admitting: Family Medicine

## 2018-10-29 ENCOUNTER — Encounter: Payer: Self-pay | Admitting: Family Medicine

## 2018-10-29 DIAGNOSIS — M489 Spondylopathy, unspecified: Secondary | ICD-10-CM

## 2018-10-29 DIAGNOSIS — J329 Chronic sinusitis, unspecified: Secondary | ICD-10-CM | POA: Diagnosis not present

## 2018-10-29 DIAGNOSIS — F4321 Adjustment disorder with depressed mood: Secondary | ICD-10-CM | POA: Insufficient documentation

## 2018-10-29 MED ORDER — AZITHROMYCIN 250 MG PO TABS: ORAL_TABLET | ORAL | 0 refills | Status: DC

## 2018-10-29 NOTE — Assessment & Plan Note (Signed)
Sudden death in second husband with no chance for goodbyes.  Handling OK.  As she said, "I have been through this before."

## 2018-10-29 NOTE — Assessment & Plan Note (Signed)
Moderate flair.  I think shoulder findings are more from spasm than radiculopathy.  Muscle relaxer.

## 2018-10-29 NOTE — Assessment & Plan Note (Signed)
Has amox allergy.  Will use azithro

## 2018-10-29 NOTE — Patient Instructions (Signed)
I sent in an antibiotic for the sinus infection. The right shoulder spasm is from the arthritis in your neck.  Do the usual things with massage and heating pad.  You can take two muscle relaxers at night.  If you take two muscle relaxers, do not take the lorazepam.   If the neck pain continues for 3 weeks, I will set you up with physical therapy. I do recommend grief counseling at hospice when you are ready.  Call and set it up. It is terrible now and will be terrible for several more weeks.  Then it will shift from terrible to bad.  It will be bad for a long time.  Others will forget.   Try to avoid big decisions in the first year. See me again in one month.
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
## 2018-11-04 ENCOUNTER — Other Ambulatory Visit: Payer: Self-pay

## 2018-11-04 ENCOUNTER — Ambulatory Visit (INDEPENDENT_AMBULATORY_CARE_PROVIDER_SITE_OTHER): Payer: Medicare Other | Admitting: Family Medicine

## 2018-11-04 ENCOUNTER — Encounter: Payer: Self-pay | Admitting: Family Medicine

## 2018-11-04 DIAGNOSIS — F4321 Adjustment disorder with depressed mood: Secondary | ICD-10-CM

## 2018-11-04 DIAGNOSIS — J329 Chronic sinusitis, unspecified: Secondary | ICD-10-CM

## 2018-11-04 DIAGNOSIS — M489 Spondylopathy, unspecified: Secondary | ICD-10-CM | POA: Diagnosis not present

## 2018-11-04 DIAGNOSIS — I5032 Chronic diastolic (congestive) heart failure: Secondary | ICD-10-CM

## 2018-11-04 MED ORDER — BACLOFEN 10 MG PO TABS: 10.0000 mg | ORAL_TABLET | Freq: Three times a day (TID) | ORAL | 3 refills | Status: DC | PRN

## 2018-11-04 NOTE — Patient Instructions (Addendum)
Stop the flexeril - and if the new muscle relaxer works well, you will stay off this medication. The new medicine is baclofen. Someone should call to set up physical therapy. Theresa Brennan will give you instructions about the x rays.  I will call with the results. Just let me know when you are ready for me to send a prescription to your pharmacy for the new shingles vaccine.

## 2018-11-05 LAB — BASIC METABOLIC PANEL
BUN/Creatinine Ratio: 18 (ref 12–28)
BUN: 14 mg/dL (ref 8–27)
CALCIUM: 9.2 mg/dL (ref 8.7–10.3)
CO2: 26 mmol/L (ref 20–29)
Chloride: 99 mmol/L (ref 96–106)
Creatinine, Ser: 0.8 mg/dL (ref 0.57–1.00)
GFR calc Af Amer: 85 mL/min/{1.73_m2} (ref >59)
GFR calc non Af Amer: 73 mL/min/{1.73_m2} (ref >59)
Glucose: 88 mg/dL (ref 65–99)
POTASSIUM: 4 mmol/L (ref 3.5–5.2)
Sodium: 141 mmol/L (ref 134–144)

## 2018-11-05 NOTE — Assessment & Plan Note (Signed)
BMP normal.  Euvolemic on exam.

## 2018-11-05 NOTE — Assessment & Plan Note (Signed)
Pain seems more like muscle spasm rather than radiculopathy, but I cannot be certain. c spine plane films.  (too early for MRI and "I wouldn't want surgery.") No response to flexeril.  Switch to baclofen which is better long term at her age. Physical therapy.

## 2018-11-05 NOTE — Assessment & Plan Note (Signed)
Seems to be progressing at the expected slow pace.

## 2018-11-05 NOTE — Assessment & Plan Note (Signed)
Improving.  No indication for more antibiotics.
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
## 2018-11-06 ENCOUNTER — Ambulatory Visit
Admission: RE | Admit: 2018-11-06 | Discharge: 2018-11-06 | Disposition: A | Discharge status: Home / Self Care | Payer: Medicare Other | Source: Ambulatory Visit | Attending: Family Medicine | Admitting: Family Medicine

## 2018-11-06 ENCOUNTER — Other Ambulatory Visit: Payer: Self-pay

## 2018-11-06 DIAGNOSIS — M489 Spondylopathy, unspecified: Secondary | ICD-10-CM

## 2018-11-06 DIAGNOSIS — M4802 Spinal stenosis, cervical region: Secondary | ICD-10-CM | POA: Diagnosis not present

## 2018-11-06 DIAGNOSIS — M4722 Other spondylosis with radiculopathy, cervical region: Secondary | ICD-10-CM | POA: Diagnosis not present

## 2018-11-09 ENCOUNTER — Telehealth: Payer: Self-pay | Admitting: Family Medicine

## 2018-11-09 MED ORDER — CELECOXIB 100 MG PO CAPS: 100.0000 mg | ORAL_CAPSULE | Freq: Two times a day (BID) | ORAL | 3 refills | Status: DC

## 2018-11-09 MED ORDER — OXYCODONE-ACETAMINOPHEN 5-325 MG PO TABS: 1.0000 | ORAL_TABLET | Freq: Four times a day (QID) | ORAL | 0 refills | Status: DC | PRN

## 2018-11-09 NOTE — Telephone Encounter (Signed)
Called:  C/O severe pain.  Given 3 days of oxycodone to get her started on PT.  Long term, celebrex added to her regimen.

## 2018-11-12 ENCOUNTER — Other Ambulatory Visit: Payer: Self-pay

## 2018-11-12 ENCOUNTER — Encounter: Payer: Self-pay | Admitting: Physical Therapy

## 2018-11-12 ENCOUNTER — Ambulatory Visit: Payer: Medicare Other | Attending: Family Medicine | Admitting: Physical Therapy

## 2018-11-12 DIAGNOSIS — M542 Cervicalgia: Secondary | ICD-10-CM

## 2018-11-12 NOTE — Patient Instructions (Addendum)
Access Code: [facility]  URL: [email or web address]  Date: 11/12/2018  Prepared by: Ivery Quale   Exercises  Supine Chin Tuck - 10 reps - 3 sets - 2x daily - 6x weekly  Seated Levator Scapulae Stretch - 3 sets - 30 hold - 2x daily - 6x weekly  Seated Cervical Sidebending Stretch - 3 sets - 30 hold - 2x daily - 6x weekly  Cervical Extension AROM with Strap - 10 reps - 1-2 sets - 5 hold - 2x daily - 6x weekly  Seated Assisted Cervical Rotation with Towel - 10 reps - 1-2 sets - 5 hold - 2x daily - 6x weekly  Seated Scapular Retraction - 10 reps - 2-3 sets - 5 sec hold - 2x daily - 6x weekly  TENS UNIT: This is helpful for muscle pain and spasm.   Search and Purchase a TENS 7000 2nd edition at www.tenspros.com. It should be less than $30.     TENS unit instructions: Do not shower or bathe with the unit on Turn the unit off before removing electrodes or batteries If the electrodes lose stickiness add a drop of water to the electrodes after they are disconnected from the unit and place on plastic sheet. If you continued to have difficulty, call the TENS unit company to purchase more electrodes. Do not apply lotion on the skin area prior to use. Make sure the skin is clean and dry as this will help prolong the life of the electrodes. After use, always check skin for unusual red areas, rash or other skin difficulties. If there are any skin problems, does not apply electrodes to the same area. Never remove the electrodes from the unit by pulling the wires. Do not use the TENS unit or electrodes other than as directed. Do not change electrode placement without consultating your therapist or physician. Keep 2 fingers with between each electrode. Wear time ratio is 2:1, on to off times.    For example on for 30 minutes off for 15 minutes and then on for 30 minutes off for 15 minutes
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
## 2018-11-16 ENCOUNTER — Ambulatory Visit: Payer: Medicare Other

## 2018-11-18 ENCOUNTER — Encounter: Payer: Medicare Other | Admitting: Physical Therapy

## 2018-11-23 ENCOUNTER — Encounter: Payer: Medicare Other | Admitting: Physical Therapy

## 2018-11-25 ENCOUNTER — Encounter: Payer: Medicare Other | Admitting: Physical Therapy

## 2018-11-26 ENCOUNTER — Telehealth: Payer: Self-pay | Admitting: Physical Therapy

## 2018-11-26 NOTE — Telephone Encounter (Signed)
She was contacted today regarding the temporary reduction of OP Rehab Services due to concerns for community transmission of Covid-19.    Therapist advised the patient to continue to perform their HEP and assured they had no unanswered questions at this time.  She is interested in telehealth.  Ivery Quale, PT, DPT 11/26/18 2:25 PM

## 2018-11-30 ENCOUNTER — Ambulatory Visit: Payer: Medicare Other | Admitting: Physical Therapy

## 2018-12-02 ENCOUNTER — Ambulatory Visit: Payer: Medicare Other | Admitting: Physical Therapy

## 2018-12-07 ENCOUNTER — Encounter: Payer: Medicare Other | Admitting: Physical Therapy

## 2018-12-09 ENCOUNTER — Encounter: Payer: Medicare Other | Admitting: Physical Therapy

## 2018-12-14 ENCOUNTER — Encounter: Payer: Medicare Other | Admitting: Physical Therapy

## 2018-12-16 ENCOUNTER — Encounter: Payer: Medicare Other | Admitting: Physical Therapy

## 2018-12-17 ENCOUNTER — Encounter: Payer: Self-pay | Admitting: Licensed Clinical Social Worker

## 2018-12-17 ENCOUNTER — Telehealth: Payer: Self-pay | Admitting: Licensed Clinical Social Worker

## 2018-12-17 ENCOUNTER — Telehealth: Payer: Self-pay | Admitting: Family Medicine

## 2018-12-17 MED ORDER — NITROFURANTOIN MONOHYD MACRO 100 MG PO CAPS: 100.0000 mg | ORAL_CAPSULE | Freq: Two times a day (BID) | ORAL | 0 refills | Status: DC

## 2018-12-17 NOTE — Telephone Encounter (Signed)
UTI symptoms - burning and frequency Has UTI often and it feels like that\ No fever or back pain or vag discharge or sores  Last treated in jan with cipro Feels like cephalexin does not work well  Will send in Nitrofurantoin twice a day  Recently her husband and sister passed away She if living alone Feels is "ok" but nights are very hard. Would be glad to talk with a counselor about her losses  Agrees to call if her UTI symptoms do not improve and if she is feeling worse or more depressed

## 2018-12-17 NOTE — Telephone Encounter (Signed)
Pt called to see if she could get antibiotic called in today for a bladder infection that she has. Please give pt a call back.

## 2018-12-17 NOTE — Telephone Encounter (Signed)
  12/17/2018  Type of Service: General Social Work Consult via phone Start time: 11:40   End time: 12:00 Total time: 20 minutes  Name: [name] MRN: [id number] DOB: Nov 25, 1945  Confirmed patient's address: Yes    Confirmed patient's date of birth  Yes   Theresa Brennan is a 73 y.o. female referred by Dr. Juanna Cao for concerns with grief.  Review of patient's consultants notes from appropriate care team members was performed as part of care coordination referral.    LCSW called patient to assess needs.  Patient is pleasant tearful at times and engaged in conversation. She lives alone with her 2 dogs.  Patient is experiencing symptoms of grief with the recent loss of her husband and oldest sister.  She is in a agreement and may benefit from ongoing grief counseling.  Duration of problem : 2 months   Recent life changes: husband of 19 years passed away 2 months ago unexpectedly; sister passed away last week. Strengths: family support and church family Issues discussed: support system, community resource options,  how patient is managing.  GOALS: Demonstrate ability to: Begin healthy grieving over loss Intervention: SDOH (Social Determinants of Health) assessed today no challenges identified. Provided client with information about grief counseling with [facility],   also utilized engagement as part of my intervention. Other interventions include; Supportive Counseling and emotional support. Plan:   1. Patient will call [facility] this week  2. LCSW will F/U with patient next week to offer support and to see if she connected with resources provided. Moses Manners, MD  And Dr. Deirdre Priest have been notified of this outreach and Theresa Brennan's plan.   Sammuel Hines, LCSW [facility]   [phone] 12:17 PM

## 2018-12-21 ENCOUNTER — Encounter: Payer: Medicare Other | Admitting: Physical Therapy

## 2018-12-21 ENCOUNTER — Telehealth (INDEPENDENT_AMBULATORY_CARE_PROVIDER_SITE_OTHER): Payer: Medicare Other | Admitting: Family Medicine

## 2018-12-21 ENCOUNTER — Other Ambulatory Visit: Payer: Self-pay

## 2018-12-21 DIAGNOSIS — N3000 Acute cystitis without hematuria: Secondary | ICD-10-CM | POA: Diagnosis not present

## 2018-12-21 DIAGNOSIS — J328 Other chronic sinusitis: Secondary | ICD-10-CM

## 2018-12-21 DIAGNOSIS — F4321 Adjustment disorder with depressed mood: Secondary | ICD-10-CM

## 2018-12-21 MED ORDER — CIPROFLOXACIN HCL 250 MG PO TABS: 250.0000 mg | ORAL_TABLET | Freq: Two times a day (BID) | ORAL | 0 refills | Status: DC

## 2018-12-21 NOTE — Assessment & Plan Note (Signed)
Seems stable.  Normal conversation and has people to talk with

## 2018-12-21 NOTE — Progress Notes (Signed)
[facility] Telemedicine Visit  Patient consented to have virtual visit. Method of visit: Telephone  Encounter participants: Patient: Theresa Brennan - located at home Provider: Carney Living - located at office Others (if applicable): no  Chief Complaint: UTI  HPI:  Still having burning and frequency.  Macrobid did not help.  No fever or nausea and vomiting or chills or new back pain  Also having "sinusitis"   Used flonase last week but not in last few days.  Nose is stuffy.  Had headache over weekend a little better now.  No visual changes or falling  Mood - spoke with Theresa Brennan last week which helped.  Also talks with sister.   Really misses her husband but is getting around the home and eating "ok"   ROS: per HPI  Pertinent PMHx: frequent UTI, migraine, chronic sinusitis Exam:  Respiratory: normal speech pattern no dyspnea Psych:  Cognition and judgment appear intact. Alert, communicative  and cooperative with normal attention span and concentration. No apparent delusions, illusions, hallucinations   Assessment/Plan:  Urinary tract infection Worsened.  Will try cipro twice a day for 7 days.  Asked to call if not better in 2 days or if worsening with fever or nausea and vomiting   Sinusitis, chronic Seems to be a flare.  Recommend using her flonase daily. Hopefully cipro for uti may help.   Call if not improving   Grief Seems stable.  Normal conversation and has people to talk with     Time spent during visit with patient: 15 minutes

## 2018-12-21 NOTE — Telephone Encounter (Signed)
Called and offered condolenses.  In addition to her husband's death, her older sister (who was like a mother to [place]) died last week.    Still struggling with UTI sx.  Apparently, Dr. Deirdre Priest spoke to her this morning and called in cipro.

## 2018-12-21 NOTE — Assessment & Plan Note (Signed)
Worsened.  Will try cipro twice a day for 7 days.  Asked to call if not better in 2 days or if worsening with fever or nausea and vomiting

## 2018-12-21 NOTE — Assessment & Plan Note (Signed)
Seems to be a flare.  Recommend using her flonase daily. Hopefully cipro for uti may help.   Call if not improving

## 2018-12-23 ENCOUNTER — Encounter: Payer: Medicare Other | Admitting: Physical Therapy

## 2018-12-24 NOTE — Telephone Encounter (Addendum)
   Phone Outreach Note  12/24/2018 Name: Theresa Brennan MRN: [id number] DOB: 05/07/1946  Referred by: Moses Manners, MD Reason for referral : Other (Grief)  An unsuccessful telephone outreach to patient was attempted today ref. F/U call .  Left voice message to call LCSW   Follow Up Plan:  If no return call is received. LCSW will call again in 5 to 7 days.  Sammuel Hines, LCSW [facility]   [phone] 2:09 PM

## 2018-12-25 ENCOUNTER — Telehealth: Payer: Self-pay | Admitting: Physical Therapy

## 2018-12-25 NOTE — Telephone Encounter (Signed)
Spoke with Theresa Brennan and informed them unfortunately their insurance benefits do not cover Telehealth. If they would like to continue with physical therapy, we can reach out to them once the clinic re-opens. If they wish to be discharged from PT, we can go ahead and discharge them. Pt noted she feels uncomfortable if she were leave the house even due to the risk and would much rather discharge from PT now and restart with a new referral once the current situation gets more under control. In the meantime pt is aware they are welcome to reach out to [place] any time if they have any questions.

## 2018-12-28 ENCOUNTER — Encounter: Payer: Medicare Other | Admitting: Physical Therapy

## 2018-12-30 ENCOUNTER — Telehealth: Payer: Self-pay | Admitting: Licensed Clinical Social Worker

## 2018-12-30 ENCOUNTER — Encounter: Payer: Medicare Other | Admitting: Physical Therapy

## 2018-12-30 NOTE — Telephone Encounter (Signed)
Care Coordination  Telephone Outreach Note 12/30/2018 Name: Theresa Brennan MRN: [id number] DOB: 10/31/1945  F/U call to [name] reference getting her connected to grief resources. Patient reports she has not connected to resources at this time.  Patient is busy and states she will call LCSW back.  Plan: LCSW will wait for return call.  Sammuel Hines, LCSW [facility]   [phone] 3:06 PM

## 2018-12-31 DIAGNOSIS — M75102 Unspecified rotator cuff tear or rupture of left shoulder, not specified as traumatic: Secondary | ICD-10-CM | POA: Diagnosis not present

## 2019-01-01 ENCOUNTER — Other Ambulatory Visit: Payer: Self-pay | Admitting: Gastroenterology

## 2019-01-05 ENCOUNTER — Telehealth (INDEPENDENT_AMBULATORY_CARE_PROVIDER_SITE_OTHER): Payer: Medicare Other | Admitting: Family Medicine

## 2019-01-05 ENCOUNTER — Telehealth: Payer: Self-pay | Admitting: Internal Medicine

## 2019-01-05 ENCOUNTER — Other Ambulatory Visit: Payer: Self-pay

## 2019-01-05 DIAGNOSIS — R079 Chest pain, unspecified: Secondary | ICD-10-CM

## 2019-01-05 DIAGNOSIS — I5032 Chronic diastolic (congestive) heart failure: Secondary | ICD-10-CM | POA: Diagnosis not present

## 2019-01-05 DIAGNOSIS — F4321 Adjustment disorder with depressed mood: Secondary | ICD-10-CM

## 2019-01-05 NOTE — Assessment & Plan Note (Signed)
Brief episode during high stress time.  Refer to cards.  No changes for now.

## 2019-01-05 NOTE — Assessment & Plan Note (Signed)
Two major losses in the last month.  Sister and husband.  Today would have been their anniversary.  She is coping.  No changes.

## 2019-01-05 NOTE — Progress Notes (Signed)
Agrees to video visit. Video could not connect. Agree to phone visit Depression- stable.  Recent death of husband.  Also, death of her older sister 10 days ago.  Quite a one-two punch.  She still feels overwelmed.  She is coping and taking care of business.  Patient C/O swelling of ankles during day, hands and face when she awakens in the morning.  On maxide for BP.  1 week ago had a spell of Chest pain and heart racing.  Duration was <30 minutes.  Resolved spontaneously and has not recurred.  Of course, has considerable stress  Saw Dr. Rennis Golden 2016, not since.  Is taking both aspirin and crestor daily. Echo 2016, Grade 2 diastolic dysfunction. States good with sodium intake.  Labs done 3/11 showed good renal function.  Duration of visit was 25 minutes.

## 2019-01-05 NOTE — Telephone Encounter (Signed)
New message:    Patient calling to see if she can get a appt with the doctor, however, the last time patient  been seen was 2016. Patient stating she is having some swelling ing legs and feet. Please call patient. She also would like a appt.

## 2019-01-05 NOTE — Telephone Encounter (Signed)
Spoke with pt who state she has been experiencing leg swelling and had 2 episodes of chest pain. She denies SOB or active chest pain at the moment. Pt report she has reached out to her pcp who has order an ECHO. Informed pt since she hasn't been seen since 2016, we will have to set her up for a new patient appointment and will have scheduler reach out to her. Pt also advised if chest pain reoccur, to report to ED for further evaluations. Pt voiced understanding.

## 2019-01-05 NOTE — Assessment & Plan Note (Addendum)
Sounds like worsening diastolic dysfunction. Likely needs a loop diuretic.  She will need potassium supplementation if furosemide.  Also, get echo and plan to refer back to cards.  Per patient request, no change in diuretic until I see the echo.

## 2019-01-07 ENCOUNTER — Telehealth: Payer: Self-pay | Admitting: Licensed Clinical Social Worker

## 2019-01-07 NOTE — Telephone Encounter (Signed)
Care Coordination  Telephone Outreach Note 01/07/2019 Name: CERIAH GUIBORD MRN: [id number] DOB: 24-Jul-1946  Type of Service: General Social Work Consult via phone Start time: 2:00   End time: 2:30 Total time: 30 minutes  Referred by: PCP, Dr.Hensel for Grief support and resources F/U call to [name] to see if she was able to connect to grief resources provided.   Confirmed patient's name: Yes    Confirmed patient's date of birth  Yes   Patient is pleasant and engaged in conversation.  She continues to experience symptoms of grief and reports often crying.  Patient has a great support system with her son's, church family and extended family.  Patient maybe benefit from and is in agreement to call [facility] for ongoing grief counseling.  GOALS: 1. Increase activity  2.   Begin healthy grieving over loss Intervention:Client interviewed and appropriate assessments performed. Discussed support system, and community resources. Provided client with information about [facility] for grief counseling, the importance of setting a daily routine and finding things that bring pleasure. Other interventions include, Problem-solving teaching/coping strategies, Psychoeducation, Supportive Counseling and Referral to Grief Counseling as well as emotional support,  Plan:  1. Patient will call [facility]  2. Patient will work on her quilt 3. Patient go to drive in church weekly 4. Talk to one person on the phone each day 5. LCSW will F/U in 2 weeks Hensel, Santiago Bumpers, MD has been notified of this outreach and Ms. Channelle E Calzada's plan.   Sammuel Hines, LCSW [facility]   [phone] 2:46 PM

## 2019-01-15 ENCOUNTER — Telehealth: Payer: Medicare Other | Admitting: Internal Medicine

## 2019-01-17 DIAGNOSIS — M75102 Unspecified rotator cuff tear or rupture of left shoulder, not specified as traumatic: Secondary | ICD-10-CM | POA: Diagnosis not present

## 2019-01-17 DIAGNOSIS — S46012A Strain of muscle(s) and tendon(s) of the rotator cuff of left shoulder, initial encounter: Secondary | ICD-10-CM | POA: Diagnosis not present

## 2019-01-17 DIAGNOSIS — M25512 Pain in left shoulder: Secondary | ICD-10-CM | POA: Diagnosis not present

## 2019-01-20 ENCOUNTER — Telehealth: Payer: Self-pay | Admitting: Internal Medicine

## 2019-01-20 NOTE — Telephone Encounter (Signed)
Mychart, no smartphone, consent, pre reg complete 01/20/19 AF

## 2019-01-22 ENCOUNTER — Telehealth (HOSPITAL_COMMUNITY): Payer: Self-pay | Admitting: Cardiology

## 2019-01-22 ENCOUNTER — Encounter: Payer: Self-pay | Admitting: Internal Medicine

## 2019-01-22 ENCOUNTER — Telehealth: Payer: Self-pay | Admitting: Internal Medicine

## 2019-01-22 ENCOUNTER — Telehealth (INDEPENDENT_AMBULATORY_CARE_PROVIDER_SITE_OTHER): Payer: Medicare Other | Admitting: Internal Medicine

## 2019-01-22 VITALS — BP 139/78 | HR 92 | Ht 60.0 in | Wt 161.0 lb

## 2019-01-22 DIAGNOSIS — I5031 Acute diastolic (congestive) heart failure: Secondary | ICD-10-CM | POA: Diagnosis not present

## 2019-01-22 DIAGNOSIS — I5032 Chronic diastolic (congestive) heart failure: Secondary | ICD-10-CM | POA: Diagnosis not present

## 2019-01-22 DIAGNOSIS — S46012D Strain of muscle(s) and tendon(s) of the rotator cuff of left shoulder, subsequent encounter: Secondary | ICD-10-CM | POA: Diagnosis not present

## 2019-01-22 DIAGNOSIS — M7521 Bicipital tendinitis, right shoulder: Secondary | ICD-10-CM | POA: Diagnosis not present

## 2019-01-22 DIAGNOSIS — E8809 Other disorders of plasma-protein metabolism, not elsewhere classified: Secondary | ICD-10-CM

## 2019-01-22 DIAGNOSIS — I110 Hypertensive heart disease with heart failure: Secondary | ICD-10-CM

## 2019-01-22 DIAGNOSIS — I10 Essential (primary) hypertension: Secondary | ICD-10-CM

## 2019-01-22 DIAGNOSIS — Z79899 Other long term (current) drug therapy: Secondary | ICD-10-CM

## 2019-01-22 MED ORDER — FUROSEMIDE 20 MG PO TABS: 20.0000 mg | ORAL_TABLET | Freq: Every day | ORAL | 3 refills | Status: DC

## 2019-01-22 NOTE — Patient Instructions (Addendum)
Medication Instructions:  START furosemide 20mg  daily INCREASE potassium to twice daily - 4 total tablets per day STOP triameterene-hydrochlorothiazide   If you need a refill on your cardiac medications before your next appointment, please call your pharmacy.   Lab work: Lab work Wednesday/Thursday - BMET -- either Dr. Cyndia Skeeters office or Dr. Blanchie Dessert office (open M-F 8-4:30pm)  If you have labs (blood work) drawn today and your tests are completely normal, you will receive your results only by: Marland Kitchen MyChart Message (if you have MyChart) OR . A paper copy in the mail If you have any lab test that is abnormal or we need to change your treatment, we will call you to review the results.  Testing/Procedures: Echo on Monday as planned  Follow-Up: At [facility], you and your health needs are our priority.  As part of our continuing mission to provide you with exceptional heart care, we have created designated Provider Care Teams.  These Care Teams include your primary Cardiologist (physician) and Advanced Practice Providers (APPs -  Physician Assistants and Nurse Practitioners) who all work together to provide you with the care you need, when you need it. You will need a follow up appointment in 2-3 weeks in the office.  You may see Dr. Rennis Golden or one of the following Advanced Practice Providers on your designated Care Team: Azalee Course, [place] . Micah Flesher, PA-C

## 2019-01-22 NOTE — Telephone Encounter (Signed)
Follow up:   Patient returning your call back concering her appt.please call patient back.
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
## 2019-01-22 NOTE — Telephone Encounter (Signed)
Patient called to review e-visit instructions. Patient aware that the following changes have been made:  -- START furosemide 20mg  daily -- INCREASE potassium to twice daily - 4 total tablets per day -- STOP triameterene-hydrochlorothiazide  Patient aware that they will need the following labs:  -- BMET Wednesday or Thursday of next week Patient aware that they will need the following test(s): echo as planned on June 1  Message to scheduler to arrange next OV  [facility]

## 2019-01-22 NOTE — Telephone Encounter (Signed)
Patient returned call. Notified of instructions.

## 2019-01-22 NOTE — Telephone Encounter (Signed)

## 2019-01-22 NOTE — Telephone Encounter (Signed)
Documented in another telephone note.

## 2019-01-25 ENCOUNTER — Telehealth: Payer: Self-pay | Admitting: *Deleted

## 2019-01-25 ENCOUNTER — Ambulatory Visit (HOSPITAL_COMMUNITY): Payer: Medicare Other

## 2019-01-25 ENCOUNTER — Other Ambulatory Visit: Payer: Self-pay

## 2019-01-25 NOTE — Telephone Encounter (Signed)
Left message for patient to call and schedule in office visit (per 01/22/19 staff message) with Micah Flesher on 02/08/19.

## 2019-01-27 ENCOUNTER — Telehealth: Payer: Self-pay | Admitting: Licensed Clinical Social Worker

## 2019-01-27 NOTE — Telephone Encounter (Signed)
Care Coordination  Telephone Outreach Note 01/27/2019 Name: Theresa Brennan MRN: [id number] DOB: 1946-05-08 F/ U call to Theresa Brennan  Patient reports having a lot of things going on at home that keeps her busy. She has not been able to work on any of the goals discussed at the last phone visit (calling for grief counseling and working on her quilt) . Patient continues to talk with her pastor and has support from her children. Her main concern is managing her health and pain in her shoulder.  Patient is appreciative of the F/U call. Plan:  1. Patient will call LCSW if needed. 2. LCSW will discontinue bi-weekly calls and will wait for patient to call if services are needed.  Sammuel Hines, LCSW [facility]   [phone] 11:52 AM

## 2019-02-01 ENCOUNTER — Telehealth: Payer: Self-pay | Admitting: Physician Assistant

## 2019-02-02 DIAGNOSIS — Z79899 Other long term (current) drug therapy: Secondary | ICD-10-CM | POA: Diagnosis not present

## 2019-02-03 ENCOUNTER — Telehealth (INDEPENDENT_AMBULATORY_CARE_PROVIDER_SITE_OTHER): Payer: Medicare Other | Admitting: Family Medicine

## 2019-02-03 ENCOUNTER — Telehealth (HOSPITAL_COMMUNITY): Payer: Self-pay | Admitting: Radiology

## 2019-02-03 DIAGNOSIS — I5032 Chronic diastolic (congestive) heart failure: Secondary | ICD-10-CM

## 2019-02-03 DIAGNOSIS — N3000 Acute cystitis without hematuria: Secondary | ICD-10-CM

## 2019-02-03 LAB — BASIC METABOLIC PANEL
BUN/Creatinine Ratio: 22 (ref 12–28)
BUN: 16 mg/dL (ref 8–27)
CO2: 25 mmol/L (ref 20–29)
Calcium: 9.9 mg/dL (ref 8.7–10.3)
Chloride: 102 mmol/L (ref 96–106)
Creatinine, Ser: 0.72 mg/dL (ref 0.57–1.00)
GFR calc Af Amer: 96 mL/min/{1.73_m2} (ref >59)
GFR calc non Af Amer: 83 mL/min/{1.73_m2} (ref >59)
Glucose: 80 mg/dL (ref 65–99)
Potassium: 4.8 mmol/L (ref 3.5–5.2)
Sodium: 142 mmol/L (ref 134–144)

## 2019-02-03 MED ORDER — CEPHALEXIN 500 MG PO CAPS: 500.0000 mg | ORAL_CAPSULE | Freq: Four times a day (QID) | ORAL | 0 refills | Status: AC

## 2019-02-03 NOTE — Assessment & Plan Note (Signed)
Improved off celebrex and on lasix.  Repeat echo pending.  Potassium stable.

## 2019-02-03 NOTE — Telephone Encounter (Signed)

## 2019-02-03 NOTE — Telephone Encounter (Signed)
Started as a simple call to relate labs.  Turned into a phone call visit, to which she agrees: Issues: 1. Acute on chronic CHF.  Seems to be responding to lasix nicely.  Legs much less swollen in the morning.  No shortness of breath.  No hypokalemia on lasix. 2. Celebrex intollerance.  States needed to stop celebrex because it was contributing to her swelling.  Feel better off of it.  I DCed from med list and added to allergy list. 3. UTI sx.  Has frequent UTIs.  Typical urgency, frequency and dysuria.  No fever or flank pain.  Rx with keflex.  "Please give me 7 days.  It seems to always take 7 days to clear up my infection. 4. Grief: Good and bad days.  She has entered the period when it is still fresh in her mind but old news to most folks around her.  She feels alone.  She appreciates the calls from Casimer Lanius. 5. Planned shoulder surgery for rotator cuff tear.  She has an echo planned for clearance.   Duration of call 18 minutes.

## 2019-02-03 NOTE — Assessment & Plan Note (Signed)
Seems to be an uncomplicated UTI.  Will treat.

## 2019-02-03 NOTE — Telephone Encounter (Signed)
Left message to call the office for screening questions.

## 2019-02-04 ENCOUNTER — Ambulatory Visit (HOSPITAL_COMMUNITY): Payer: Medicare Other | Attending: Cardiovascular Disease

## 2019-02-04 ENCOUNTER — Other Ambulatory Visit: Payer: Self-pay

## 2019-02-04 DIAGNOSIS — I5032 Chronic diastolic (congestive) heart failure: Secondary | ICD-10-CM
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
## 2019-02-08 ENCOUNTER — Telehealth: Payer: Self-pay | Admitting: Internal Medicine

## 2019-02-08 ENCOUNTER — Ambulatory Visit: Payer: Medicare Other | Admitting: Physician Assistant

## 2019-02-08 NOTE — Telephone Encounter (Signed)
New Message   Patient would like results of her Echo. Please call.

## 2019-02-08 NOTE — Telephone Encounter (Signed)
Echo shows low normal heart function (50-55%) - probably diastolic CHF.  Dr. Lemmie Evens

## 2019-02-08 NOTE — Telephone Encounter (Signed)
PCP ordered echo Will route to Dr. Debara Pickett to review

## 2019-02-08 NOTE — Telephone Encounter (Signed)
Pt advised that we will call her after Dr. Debara Pickett is done reviewing her Echo. Pt agreed.

## 2019-02-08 NOTE — Telephone Encounter (Signed)
Patient called w/MD interpretation of echo results

## 2019-02-12 ENCOUNTER — Other Ambulatory Visit: Payer: Self-pay | Admitting: Family Medicine

## 2019-02-12 DIAGNOSIS — E785 Hyperlipidemia, unspecified: Secondary | ICD-10-CM

## 2019-02-16 ENCOUNTER — Telehealth: Payer: Self-pay | Admitting: Licensed Clinical Social Worker

## 2019-02-16 NOTE — Telephone Encounter (Addendum)
Care Coordination  Telephone Outreach Note 02/16/2019 Name: Theresa Brennan MRN: [id number] DOB: 1945-10-05  Referred by: Dr. Andria Frames F/U call to  Ms. Jeanella Flattery refer. Grief support. Patient not feeling well today.  Phone appointment scheduled for tomorrow 10/19/2018  Alistair Senft, [place]   [phone] 1:20 PM

## 2019-02-17 ENCOUNTER — Other Ambulatory Visit: Payer: Self-pay

## 2019-02-17 NOTE — Telephone Encounter (Signed)
   Unsuccessful Phone Outreach Note  02/17/2019 Name: Theresa Brennan MRN: [id number] DOB: March 15, 1946  Referred by: [name], MD Reason for referral : Other (F/U on Grief) .  Phone appointment scheduled today. Unsuccessful telephone call. Left voice message to call LCSW on mobile phone.  Follow Up Plan:  If no return call is received. LCSW will call again in 5 to 7 days  Martha Lake, [place]   [phone] 1:45 PM

## 2019-02-18 NOTE — Telephone Encounter (Signed)
Opened in error

## 2019-02-19 ENCOUNTER — Ambulatory Visit (INDEPENDENT_AMBULATORY_CARE_PROVIDER_SITE_OTHER): Payer: Medicare Other | Admitting: Family Medicine

## 2019-02-19 ENCOUNTER — Other Ambulatory Visit: Payer: Self-pay

## 2019-02-19 ENCOUNTER — Encounter: Payer: Self-pay | Admitting: Family Medicine

## 2019-02-19 VITALS — BP 132/74 | HR 85 | Wt 161.4 lb

## 2019-02-19 DIAGNOSIS — R42 Dizziness and giddiness: Secondary | ICD-10-CM

## 2019-02-19 DIAGNOSIS — Z8639 Personal history of other endocrine, nutritional and metabolic disease: Secondary | ICD-10-CM | POA: Diagnosis not present

## 2019-02-19 LAB — BASIC METABOLIC PANEL
BUN/Creatinine Ratio: 18 (ref 12–28)
BUN: 13 mg/dL (ref 8–27)
CO2: 29 mmol/L (ref 20–29)
Calcium: 8.7 mg/dL (ref 8.7–10.3)
Chloride: 106 mmol/L (ref 96–106)
Creatinine, Ser: 0.71 mg/dL (ref 0.57–1.00)
GFR calc Af Amer: 98 mL/min/{1.73_m2} (ref >59)
GFR calc non Af Amer: 85 mL/min/{1.73_m2} (ref >59)
Glucose: 91 mg/dL (ref 65–99)
Potassium: 3.9 mmol/L (ref 3.5–5.2)
Sodium: 140 mmol/L (ref 134–144)

## 2019-02-19 NOTE — Progress Notes (Signed)
Subjective:    Theresa Brennan - 73 y.o. female MRN [id number]  Date of birth: [birth year]  CC:  Theresa Brennan is here for feeling lightheaded.  HPI:  Lightheadedness Started feeling lightheaded late Tuesday of this week Also feels weak since Tuesday, but today she has improved somewhat Yesterday, June 25, took 5 potassium supplementation pills (100 mEq), which seemed to help by this morning Was recently prescribed Lasix 20 mg daily and had hypokalemia to 2.5 Has been eating frequently to keep from passing out and drinks water (about 50-60 oz per day) and Pepsi Also endorses blurry vision since Tuesday Felt "shaky" when she woke up yesterday morning, attributes this to low blood sugar Denies any blood in her stool or melena Recently lost her husband, her sister, and her dog, who was put down this past this week  Health Maintenance:  Health Maintenance Due  Topic Date Due  . DEXA SCAN  10/18/2010  . TETANUS/TDAP  12/24/2012  . MAMMOGRAM  07/09/2014    -  reports that she quit smoking about 36 years ago. Her smoking use included cigarettes. She has a 38.00 pack-year smoking history. She has never used smokeless tobacco. - Review of Systems: Per HPI. - Past Medical History: Patient Active Problem List   Diagnosis Date Noted  . Lightheadedness 02/19/2019  . Cervical spine disease 10/29/2018  . Grief 10/29/2018  . HNP (herniated nucleus pulposus), lumbar 04/17/2018  . Lower extremity pain, bilateral 12/10/2017  . Breast cancer screening 07/24/2017  . Estrogen deficiency 07/24/2017  . Memory loss 11/06/2016  . Chronic pain syndrome 09/22/2015  . Encounter for chronic pain management 09/22/2015  . Chronic diastolic heart failure ([place]) 02/15/2015  . Hypoalbuminemia 01/10/2015  . Carotid artery stenosis 12/16/2014  . Hyperlipidemia 12/07/2014  . Subclinical hypothyroidism 12/07/2014  . Chest pain 12/05/2014  . Leg swelling 12/05/2014  . Restless leg syndrome 08/29/2014  .  Urinary tract infection 08/29/2014  . Lumbar back pain 10/09/2013  . VAGINITIS, ATROPHIC 03/29/2010  . MENOPAUSE-RELATED VASOMOTOR SYMPTOMS, HOT FLASHES 01/25/2010  . DEGENERATIVE JOINT DISEASE, HIPS 01/25/2010  . Migraine without aura 12/06/2008  . ALLERGIC RHINITIS, SEASONAL 12/06/2008  . HYPERTENSION, BENIGN 12/23/2006  . Somatization disorder 10/23/2006  . Sinusitis, chronic 10/23/2006  . GASTROESOPHAGEAL REFLUX, NO ESOPHAGITIS 10/23/2006  . IRRITABLE BOWEL SYNDROME 10/23/2006  . Interstitial cystitis 10/23/2006  . DYSPAREUNIA 10/23/2006  . Osteoarthritis, multiple sites 10/23/2006  . OSTEOPENIA 10/23/2006   - Medications: reviewed and updated   Objective:   Physical Exam BP 132/74   Pulse 85   Wt 161 lb 6.4 oz (73.2 kg)   SpO2 99%   BMI 31.52 kg/m  Gen: NAD, alert, cooperative with exam, well-appearing CV: RRR, good S1/S2, no murmur, no edema Resp: CTABL, no wheezes, non-labored Abd: SNTND, BS present, no guarding or organomegaly Skin: no rashes, normal turgor  Neuro: no gross deficits.  Psych: good insight, alert and oriented, sad mood         Assessment & Plan:   Lightheadedness Since patient is concerned about hypokalemia in the setting of her newly starting Lasix, will obtain a stat BMP today.  I reassured her that this is likely to be normal given her potassium supplementation and her normal potassium level on June 9.  Patient is up-to-date on her colonoscopy and does not report any melanotic or bright red stools, so anemia is less likely, although it is possible.  BMP resulted just prior to writing this note, and it is normal.  Patient was called and results were given to the patient.  Patient was advised to continue monitoring her symptoms and to let us know on Monday or Tuesday if she is still feeling lightheaded.  We may check a CBC and anemia panel at that time.    Lezlie Octave, M.D. 02/19/2019, 2:18 PM PGY-2, [facility]

## 2019-02-19 NOTE — Patient Instructions (Signed)
It was nice meeting you today Theresa Brennan!  We are checking your electrolytes today, and we will get this result back later today or tomorrow.  If you check your MyChart, the result will be there as well.  We will contact you as soon as we can regarding these results.  If you have any questions or concerns, please feel free to call the clinic.   Be well,  Dr. Shan Levans

## 2019-02-19 NOTE — Assessment & Plan Note (Signed)
Since patient is concerned about hypokalemia in the setting of her newly starting Lasix, will obtain a stat BMP today.  I reassured her that this is likely to be normal given her potassium supplementation and her normal potassium level on June 9.  Patient is up-to-date on her colonoscopy and does not report any melanotic or bright red stools, so anemia is less likely, although it is possible.  BMP resulted just prior to writing this note, and it is normal.  Patient was called and results were given to the patient.  Patient was advised to continue monitoring her symptoms and to let us know on Monday or Tuesday if she is still feeling lightheaded.  We may check a CBC and anemia panel at that time.

## 2019-02-24 NOTE — Telephone Encounter (Signed)
2nd Unsuccessful Phone Outreach Note  02/24/2019 Name: CANDIS KABEL MRN: [id number] DOB: 08/09/1946  Referred by: [name], MD Reason for referral : Other (F/U on Grief)   2nd unsuccessful telephone outreach attempt to Ms. Jeanella Flattery today. Left voice message.  Plan: LCSW will wait for return call, if no return call is received, will reach out to Ms. Jeanella Flattery again over the next 7 days. If unable to reach Ms. HENNIE GOSA by phone on the 3rd attempt, will discontinue outreach calls but will be available at any time to provide services to Ms. Jeanella Flattery.   Casimer Lanius, [place]   [phone] 11:56 AM

## 2019-02-27 ENCOUNTER — Other Ambulatory Visit: Payer: Self-pay | Admitting: Family Medicine

## 2019-03-05 ENCOUNTER — Telehealth: Payer: Self-pay | Admitting: Licensed Clinical Social Worker

## 2019-03-05 NOTE — Telephone Encounter (Signed)
3rd Unsuccessful Phone Outreach Note  03/05/2019 Name: Theresa Brennan MRN: [id number] DOB: 11-05-1945  Referred by: [name], MD Reason for referral : Other (F/U for grief support)  3rd unsuccessful telephone outreach attempt to [name] today. Left message and phone number for patient to call.  Plan: LCSW will discontinue outreach calls but will gladly be available at any time to provide services to [name].   Casimer Lanius, [facility]   [phone] 2:41 PM

## 2019-03-16 ENCOUNTER — Other Ambulatory Visit: Payer: Self-pay | Admitting: Family Medicine

## 2019-03-18 ENCOUNTER — Other Ambulatory Visit: Payer: Self-pay

## 2019-03-18 ENCOUNTER — Telehealth (INDEPENDENT_AMBULATORY_CARE_PROVIDER_SITE_OTHER): Payer: Medicare Other | Admitting: Family Medicine

## 2019-03-18 DIAGNOSIS — J328 Other chronic sinusitis: Secondary | ICD-10-CM

## 2019-03-18 DIAGNOSIS — R109 Unspecified abdominal pain: Secondary | ICD-10-CM

## 2019-03-18 MED ORDER — LEVOFLOXACIN 500 MG PO TABS: 500.0000 mg | ORAL_TABLET | Freq: Every day | ORAL | 0 refills | Status: DC

## 2019-03-21 ENCOUNTER — Emergency Department (HOSPITAL_COMMUNITY)
Admission: EM | Admit: 2019-03-21 | Discharge: 2019-03-22 | Disposition: A | Discharge status: Home / Self Care | Payer: Medicare Other | Attending: Emergency Medicine | Admitting: Emergency Medicine

## 2019-03-21 ENCOUNTER — Encounter (HOSPITAL_COMMUNITY): Payer: Self-pay | Admitting: Emergency Medicine

## 2019-03-21 DIAGNOSIS — R109 Unspecified abdominal pain: Secondary | ICD-10-CM | POA: Diagnosis not present

## 2019-03-21 DIAGNOSIS — R52 Pain, unspecified: Secondary | ICD-10-CM | POA: Diagnosis not present

## 2019-03-21 DIAGNOSIS — M545 Low back pain: Secondary | ICD-10-CM | POA: Diagnosis present

## 2019-03-21 DIAGNOSIS — Z87891 Personal history of nicotine dependence: Secondary | ICD-10-CM | POA: Diagnosis not present

## 2019-03-21 DIAGNOSIS — M546 Pain in thoracic spine: Secondary | ICD-10-CM | POA: Diagnosis not present

## 2019-03-21 DIAGNOSIS — E039 Hypothyroidism, unspecified: Secondary | ICD-10-CM | POA: Diagnosis not present

## 2019-03-21 DIAGNOSIS — R1030 Lower abdominal pain, unspecified: Secondary | ICD-10-CM | POA: Insufficient documentation

## 2019-03-21 DIAGNOSIS — R457 State of emotional shock and stress, unspecified: Secondary | ICD-10-CM | POA: Diagnosis not present

## 2019-03-21 DIAGNOSIS — I10 Essential (primary) hypertension: Secondary | ICD-10-CM | POA: Diagnosis not present

## 2019-03-21 DIAGNOSIS — N390 Urinary tract infection, site not specified: Secondary | ICD-10-CM | POA: Diagnosis not present

## 2019-03-21 DIAGNOSIS — M5489 Other dorsalgia: Secondary | ICD-10-CM | POA: Diagnosis not present

## 2019-03-21 LAB — URINALYSIS, ROUTINE W REFLEX MICROSCOPIC
Bacteria, UA: NONE SEEN
Bilirubin Urine: NEGATIVE
Glucose, UA: NEGATIVE mg/dL
Hgb urine dipstick: NEGATIVE
Ketones, ur: NEGATIVE mg/dL
Leukocytes,Ua: NEGATIVE
Nitrite: POSITIVE — AB
Protein, ur: NEGATIVE mg/dL
Specific Gravity, Urine: 1.005 (ref 1.005–1.030)
pH: 6 (ref 5.0–8.0)

## 2019-03-21 MED ORDER — ONDANSETRON HCL 4 MG/2ML IJ SOLN
4.0000 mg | Freq: Once | INTRAMUSCULAR | Status: AC
  Administered 2019-03-22: 4 mg via INTRAVENOUS
  Filled 2019-03-21: qty 2

## 2019-03-21 MED ORDER — FENTANYL CITRATE (PF) 100 MCG/2ML IJ SOLN
50.0000 ug | Freq: Once | INTRAMUSCULAR | Status: AC
  Administered 2019-03-22: 50 ug via INTRAVENOUS
  Filled 2019-03-21: qty 2
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
## 2019-03-21 NOTE — Progress Notes (Signed)
[facility] Tele[facility]icine Visit  Patient consented to have virtual visit. Method of visit: Telephone  Encounter participants: Patient: Theresa Brennan - located at home Provider: Guadalupe Dawn - located at [facility] Others (if applicable): none  Chief Complaint: right flank pain, sinus pressure  HPI: 73 year old female who presents for sinus pressure, nasal congestion, and frequent headaches over the last 1 week.  Patient states that last time this happened her PCP gave her an antibiotic.  States that only 7-day antibiotics work for her.  She has no ear pain.  She had watery rhinorrhea for this time.  Patient also presents for right flank pain.  Patient has frequent urinary tract infections, and is afraid that her back pain represents a kidney infection.  States this feels "just like it did last time".  She has not had any fevers but has been feeling "really bad recently".  Feels fatigue, chills, muscle aches.  Is making good urine, and has not noticed any blood in her urine.  ROS: per HPI  Pertinent PMHx: Chronic sinusitis, frequent urinary tract infections  Exam:  General: No acute distress, comfortable Respiratory: Able speak in clear coherent sentences, no respiratory distress appreciated Psych: Very pleasant coherent thought proce  Assessment/Plan:  Flank pain Given patient's past history of pyelonephritis and her frequent urinary tract infections her flank pain is suspicious for this.  Patient does not want to go the emergency department or come into clinic for fear of COVID-19.  We will go ahead and treat with Levaquin to cover concurrent sinus infection as well.  Discussed return precautions with patient as well as when to present to ED. -Levaquin 500 mg daily for 5 days -Patient to follow-up in person if symptoms worsen or she fails to improve.  Sinusitis, chronic Likely having a chronic sinusitis flare.  Levaquin which the patient is getting for  suspicion of pyelonephritis, will provide coverage for all called bacteria.  Patient to follow-up if does not improve.  Explained to patient that if either issue gets worse she will need to follow-up in person. -Levaquin 500 mg daily for 5 days    Time spent during visit with patient: 21 minutes

## 2019-03-21 NOTE — ED Triage Notes (Signed)
Patient here from home via EMS with complaints of bilateral lower back pain since Thursday. Reports being treated for UTI. Ambulatory.

## 2019-03-21 NOTE — Progress Notes (Signed)
[place] Telemedicine Visit  Patient consented to have virtual visit. Method of visit: Telephone  Encounter participants: Patient: Theresa Brennan - located at home Provider: Guadalupe Dawn - located at [facility] Others (if applicable): none  Chief Complaint: right flank pain, sinus pressure  HPI: 73 year old female who presents for sinus pressure, nasal congestion, and frequent headaches over the last 1 week.  Patient states that last time this happened her PCP gave her an antibiotic.  States that only 7-day antibiotics work for her.  She has no ear pain.  She had watery rhinorrhea for this time.  Patient also presents for right flank pain.  Patient has frequent urinary tract infections, and is afraid that her back pain represents a kidney infection.  States this feels "just like it did last time".  She has not had any fevers but has been feeling "really bad recently".  Feels fatigue, chills, muscle aches.  Is making good urine, and has not noticed any blood in her urine.  ROS: per HPI  Pertinent PMHx: Chronic sinusitis, frequent urinary tract infections  Exam:  General: No acute distress, comfortable Respiratory: Able speak in clear coherent sentences, no respiratory distress appreciated Psych: Very pleasant coherent thought process  Assessment/Plan:  No problem-specific Assessment & Plan notes found for this encounter.    Time spent during visit with patient: 21 minutes

## 2019-03-21 NOTE — Assessment & Plan Note (Addendum)
Given patient's past history of pyelonephritis and her frequent urinary tract infections her flank pain is suspicious for this.  Patient does not want to go the emergency department or come into clinic for fear of COVID-19.  We will go ahead and treat with Levaquin to cover concurrent sinus infection as well.  Discussed return precautions with patient as well as when to present to ED. -Levaquin 500 mg daily for 5 days -Patient to follow-up in person if symptoms worsen or she fails to improve.

## 2019-03-22 ENCOUNTER — Other Ambulatory Visit: Payer: Self-pay

## 2019-03-22 ENCOUNTER — Emergency Department (HOSPITAL_COMMUNITY): Payer: Medicare Other

## 2019-03-22 ENCOUNTER — Encounter (HOSPITAL_COMMUNITY): Payer: Self-pay

## 2019-03-22 DIAGNOSIS — R109 Unspecified abdominal pain: Secondary | ICD-10-CM | POA: Diagnosis not present

## 2019-03-22 DIAGNOSIS — N390 Urinary tract infection, site not specified: Secondary | ICD-10-CM | POA: Diagnosis not present

## 2019-03-22 DIAGNOSIS — M546 Pain in thoracic spine: Secondary | ICD-10-CM | POA: Diagnosis not present

## 2019-03-22 LAB — CBC WITH DIFFERENTIAL/PLATELET
Abs Immature Granulocytes: 0.02 10*3/uL (ref 0.00–0.07)
Basophils Absolute: 0.1 10*3/uL (ref 0.0–0.1)
Basophils Relative: 1 %
Eosinophils Absolute: 0.3 10*3/uL (ref 0.0–0.5)
Eosinophils Relative: 3 %
HCT: 37.4 % (ref 36.0–46.0)
Hemoglobin: 11.2 g/dL — ABNORMAL LOW (ref 12.0–15.0)
Immature Granulocytes: 0 %
Lymphocytes Relative: 29 %
Lymphs Abs: 3 10*3/uL (ref 0.7–4.0)
MCH: 26.4 pg (ref 26.0–34.0)
MCHC: 29.9 g/dL — ABNORMAL LOW (ref 30.0–36.0)
MCV: 88.2 fL (ref 80.0–100.0)
Monocytes Absolute: 0.6 10*3/uL (ref 0.1–1.0)
Monocytes Relative: 6 %
Neutro Abs: 6.4 10*3/uL (ref 1.7–7.7)
Neutrophils Relative %: 61 %
Platelets: 393 10*3/uL (ref 150–400)
RBC: 4.24 MIL/uL (ref 3.87–5.11)
RDW: 18.5 % — ABNORMAL HIGH (ref 11.5–15.5)
WBC: 10.3 10*3/uL (ref 4.0–10.5)
nRBC: 0 % (ref 0.0–0.2)

## 2019-03-22 LAB — COMPREHENSIVE METABOLIC PANEL
ALT: 15 U/L (ref 0–44)
AST: 24 U/L (ref 15–41)
Albumin: 3.7 g/dL (ref 3.5–5.0)
Alkaline Phosphatase: 66 U/L (ref 38–126)
Anion gap: 10 (ref 5–15)
BUN: 18 mg/dL (ref 8–23)
CO2: 25 mmol/L (ref 22–32)
Calcium: 8.9 mg/dL (ref 8.9–10.3)
Chloride: 106 mmol/L (ref 98–111)
Creatinine, Ser: 0.63 mg/dL (ref 0.44–1.00)
GFR calc Af Amer: >60 mL/min (ref >60)
GFR calc non Af Amer: >60 mL/min (ref >60)
Glucose, Bld: 96 mg/dL (ref 70–99)
Potassium: 3.6 mmol/L (ref 3.5–5.1)
Sodium: 141 mmol/L (ref 135–145)
Total Bilirubin: 0.5 mg/dL (ref 0.3–1.2)
Total Protein: 7.3 g/dL (ref 6.5–8.1)

## 2019-03-22 MED ORDER — LEVOFLOXACIN 500 MG PO TABS: 500.0000 mg | ORAL_TABLET | Freq: Every day | ORAL | 0 refills | Status: DC

## 2019-03-22 MED ORDER — OXYCODONE HCL 5 MG PO TABS: 5.0000 mg | ORAL_TABLET | Freq: Every evening | ORAL | 0 refills | Status: DC | PRN

## 2019-03-22 MED ORDER — IOHEXOL 300 MG/ML  SOLN
100.0000 mL | Freq: Once | INTRAMUSCULAR | Status: AC | PRN
  Administered 2019-03-22: 01:00:00 100 mL via INTRAVENOUS

## 2019-03-22 MED ORDER — SODIUM CHLORIDE (PF) 0.9 % IJ SOLN
INTRAMUSCULAR | Status: AC
  Filled 2019-03-22: qty 50

## 2019-03-22 NOTE — ED Notes (Signed)
Pt discharged around 0435 when ride arrived.

## 2019-03-22 NOTE — Assessment & Plan Note (Signed)
Likely having a chronic sinusitis flare.  Levaquin which the patient is getting for suspicion of pyelonephritis, will provide coverage for all called bacteria.  Patient to follow-up if does not improve.  Explained to patient that if either issue gets worse she will need to follow-up in person. -Levaquin 500 mg daily for 5 days

## 2019-03-23 LAB — URINE CULTURE: Culture: NO GROWTH
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
## 2019-03-30 ENCOUNTER — Encounter: Payer: Self-pay | Admitting: Physician Assistant

## 2019-03-30 ENCOUNTER — Ambulatory Visit (INDEPENDENT_AMBULATORY_CARE_PROVIDER_SITE_OTHER): Payer: Medicare Other | Admitting: Physician Assistant

## 2019-03-30 ENCOUNTER — Other Ambulatory Visit: Payer: Self-pay

## 2019-03-30 VITALS — BP 145/78 | HR 77 | Temp 97.0°F | Ht 60.0 in | Wt 169.4 lb

## 2019-03-30 DIAGNOSIS — M7989 Other specified soft tissue disorders: Secondary | ICD-10-CM

## 2019-03-30 DIAGNOSIS — F4321 Adjustment disorder with depressed mood: Secondary | ICD-10-CM

## 2019-03-30 DIAGNOSIS — N12 Tubulo-interstitial nephritis, not specified as acute or chronic: Secondary | ICD-10-CM

## 2019-03-30 DIAGNOSIS — I739 Peripheral vascular disease, unspecified: Secondary | ICD-10-CM

## 2019-03-30 DIAGNOSIS — I6521 Occlusion and stenosis of right carotid artery: Secondary | ICD-10-CM | POA: Diagnosis not present

## 2019-03-30 DIAGNOSIS — I5031 Acute diastolic (congestive) heart failure: Secondary | ICD-10-CM

## 2019-03-30 MED ORDER — POTASSIUM CHLORIDE ER 20 MEQ PO TBCR: 20.0000 meq | EXTENDED_RELEASE_TABLET | Freq: Every day | ORAL | 1 refills | Status: DC

## 2019-03-30 MED ORDER — FUROSEMIDE 40 MG PO TABS: 40.0000 mg | ORAL_TABLET | Freq: Every day | ORAL | 1 refills | Status: DC

## 2019-03-30 NOTE — Patient Instructions (Signed)
Medication Instructions:  Increase Furosemide to 40 mg every morning.  Increase Potassium to 20 mEq daily.  If you need a refill on your cardiac medications before your next appointment, please call your pharmacy.   Lab work: Your physician recommends that you return for lab work on Monday 8/10--same day as dopplers: BMET  If you have labs (blood work) drawn today and your tests are completely normal, you will receive your results only by: Marland Kitchen MyChart Message (if you have MyChart) OR . A paper copy in the mail If you have any lab test that is abnormal or we need to change your treatment, we will call you to review the results.  Testing/Procedures:  On Monday, 8/10 per Fabian Sharp, PA: Your physician has requested that you have a lower extremity arterial duplex. During this test, ultrasound is used to evaluate arterial blood flow in the legs. Allow one hour for this exam. There are no restrictions or special instructions.  Your physician has requested that you have an ankle brachial index (ABI). During this test an ultrasound and blood pressure cuff are used to evaluate the arteries that supply the arms and legs with blood. Allow thirty minutes for this exam. There are no restrictions or special instructions.  Follow-Up: At [facility], you and your health needs are our priority.  As part of our continuing mission to provide you with exceptional heart care, we have created designated Provider Care Teams.  These Care Teams include your primary Cardiologist (physician) and Advanced Practice Providers (APPs -  Physician Assistants and Nurse Practitioners) who all work together to provide you with the care you need, when you need it. You will need a follow up appointment in 2 weeks. You may see Pixie Casino, MD or one of the following Advanced Practice Providers on your designated Care Team: [place], [place] . Fabian Sharp, PA-C  Any Other Special Instructions Will Be Listed Below (If  Applicable). You have been scheduled with your Maia Breslow, MD at your primary care office tomorrow morning at 8:30 to check on your kidney. They asked that you come by yourself and please wear a mask.  Per Doreene Adas, PA--please talk to them also about starting SSRI for depression.

## 2019-03-31 ENCOUNTER — Ambulatory Visit: Payer: Medicare Other | Admitting: Family Medicine

## 2019-04-01 ENCOUNTER — Ambulatory Visit (INDEPENDENT_AMBULATORY_CARE_PROVIDER_SITE_OTHER): Payer: Medicare Other | Admitting: Family Medicine

## 2019-04-01 ENCOUNTER — Encounter: Payer: Self-pay | Admitting: Family Medicine

## 2019-04-01 ENCOUNTER — Other Ambulatory Visit: Payer: Self-pay

## 2019-04-01 VITALS — BP 148/72 | HR 77 | Wt 170.4 lb

## 2019-04-01 DIAGNOSIS — R600 Localized edema: Secondary | ICD-10-CM | POA: Diagnosis not present

## 2019-04-01 DIAGNOSIS — M545 Low back pain, unspecified: Secondary | ICD-10-CM

## 2019-04-01 DIAGNOSIS — I6521 Occlusion and stenosis of right carotid artery: Secondary | ICD-10-CM

## 2019-04-01 DIAGNOSIS — M7989 Other specified soft tissue disorders: Secondary | ICD-10-CM

## 2019-04-01 DIAGNOSIS — M549 Dorsalgia, unspecified: Secondary | ICD-10-CM | POA: Diagnosis not present

## 2019-04-01 DIAGNOSIS — E039 Hypothyroidism, unspecified: Secondary | ICD-10-CM | POA: Diagnosis not present

## 2019-04-01 LAB — POCT URINALYSIS DIP (MANUAL ENTRY)
Bilirubin, UA: NEGATIVE
Blood, UA: NEGATIVE
Glucose, UA: NEGATIVE mg/dL
Ketones, POC UA: NEGATIVE mg/dL
Leukocytes, UA: NEGATIVE
Nitrite, UA: NEGATIVE
Protein Ur, POC: NEGATIVE mg/dL
Spec Grav, UA: 1.02 (ref 1.010–1.025)
Urobilinogen, UA: 0.2 E.U./dL
pH, UA: 7 (ref 5.0–8.0)

## 2019-04-01 MED ORDER — METHOCARBAMOL 500 MG PO TABS: 500.0000 mg | ORAL_TABLET | Freq: Four times a day (QID) | ORAL | 2 refills | Status: DC

## 2019-04-01 NOTE — Patient Instructions (Addendum)
It was nice seeing you today Theresa Brennan!  Your urine does not show signs of infection today, so I believe that your back pain is due to muscle pain rather than a kidney infection.  I am giving you back stretches that you can use to help improve your back pain.  We will get some labs today to investigate your leg swelling.  I will prescribe Robaxin and stop your baclofen to see if this helps your swelling as well.  We are also checking your potassium level.  If you have any questions or concerns, please feel free to call the clinic.   Be well,  Dr. Shan Levans  Back Exercises The following exercises strengthen the muscles that help to support the trunk and back. They also help to keep the lower back flexible. Doing these exercises can help to prevent back pain or lessen existing pain.  If you have back pain or discomfort, try doing these exercises 2-3 times each day or as told by your health care provider.  As your pain improves, do them once each day, but increase the number of times that you repeat the steps for each exercise (do more repetitions).  To prevent the recurrence of back pain, continue to do these exercises once each day or as told by your health care provider. Do exercises exactly as told by your health care provider and adjust them as directed. It is normal to feel mild stretching, pulling, tightness, or discomfort as you do these exercises, but you should stop right away if you feel sudden pain or your pain gets worse. Exercises Single knee to chest Repeat these steps 3-5 times for each leg: 1. Lie on your back on a firm bed or the floor with your legs extended. 2. Bring one knee to your chest. Your other leg should stay extended and in contact with the floor. 3. Hold your knee in place by grabbing your knee or thigh with both hands and hold. 4. Pull on your knee until you feel a gentle stretch in your lower back or buttocks. 5. Hold the stretch for 10-30 seconds. 6. Slowly  release and straighten your leg. Pelvic tilt Repeat these steps 5-10 times: 1. Lie on your back on a firm bed or the floor with your legs extended. 2. Bend your knees so they are pointing toward the ceiling and your feet are flat on the floor. 3. Tighten your lower abdominal muscles to press your lower back against the floor. This motion will tilt your pelvis so your tailbone points up toward the ceiling instead of pointing to your feet or the floor. 4. With gentle tension and even breathing, hold this position for 5-10 seconds. Cat-cow Repeat these steps until your lower back becomes more flexible: 1. Get into a hands-and-knees position on a firm surface. Keep your hands under your shoulders, and keep your knees under your hips. You may place padding under your knees for comfort. 2. Let your head hang down toward your chest. Contract your abdominal muscles and point your tailbone toward the floor so your lower back becomes rounded like the back of a cat. 3. Hold this position for 5 seconds. 4. Slowly lift your head, let your abdominal muscles relax and point your tailbone up toward the ceiling so your back forms a sagging arch like the back of a cow. 5. Hold this position for 5 seconds.  Press-ups Repeat these steps 5-10 times: 1. Lie on your abdomen (face-down) on the floor. 2. Place  your palms near your head, about shoulder-width apart. 3. Keeping your back as relaxed as possible and keeping your hips on the floor, slowly straighten your arms to raise the top half of your body and lift your shoulders. Do not use your back muscles to raise your upper torso. You may adjust the placement of your hands to make yourself more comfortable. 4. Hold this position for 5 seconds while you keep your back relaxed. 5. Slowly return to lying flat on the floor.  Bridges Repeat these steps 10 times: 1. Lie on your back on a firm surface. 2. Bend your knees so they are pointing toward the ceiling and your  feet are flat on the floor. Your arms should be flat at your sides, next to your body. 3. Tighten your buttocks muscles and lift your buttocks off the floor until your waist is at almost the same height as your knees. You should feel the muscles working in your buttocks and the back of your thighs. If you do not feel these muscles, slide your feet 1-2 inches farther away from your buttocks. 4. Hold this position for 3-5 seconds. 5. Slowly lower your hips to the starting position, and allow your buttocks muscles to relax completely. If this exercise is too easy, try doing it with your arms crossed over your chest. Abdominal crunches Repeat these steps 5-10 times: 1. Lie on your back on a firm bed or the floor with your legs extended. 2. Bend your knees so they are pointing toward the ceiling and your feet are flat on the floor. 3. Cross your arms over your chest. 4. Tip your chin slightly toward your chest without bending your neck. 5. Tighten your abdominal muscles and slowly raise your trunk (torso) high enough to lift your shoulder blades a tiny bit off the floor. Avoid raising your torso higher than that because it can put too much stress on your low back and does not help to strengthen your abdominal muscles. 6. Slowly return to your starting position. Back lifts Repeat these steps 5-10 times: 1. Lie on your abdomen (face-down) with your arms at your sides, and rest your forehead on the floor. 2. Tighten the muscles in your legs and your buttocks. 3. Slowly lift your chest off the floor while you keep your hips pressed to the floor. Keep the back of your head in line with the curve in your back. Your eyes should be looking at the floor. 4. Hold this position for 3-5 seconds. 5. Slowly return to your starting position. Contact a health care provider if:  Your back pain or discomfort gets much worse when you do an exercise.  Your worsening back pain or discomfort does not lessen within 2  hours after you exercise. If you have any of these problems, stop doing these exercises right away. Do not do them again unless your health care provider says that you can. Get help right away if:  You develop sudden, severe back pain. If this happens, stop doing the exercises right away. Do not do them again unless your health care provider says that you can. This information is not intended to replace advice given to you by your health care provider. Make sure you discuss any questions you have with your health care provider. Document Released: 09/19/2004 Document Revised: 12/17/2018 Document Reviewed: 05/14/2018 Elsevier Patient Education  2020 [employer].

## 2019-04-01 NOTE — Progress Notes (Signed)
Subjective:    Theresa Brennan - 73 y.o. female MRN [id number]  Date of birth: 1946/01/11  CC:  EZELL MELIKIAN is here for follow up of a UTI.  She would also like to discuss her leg swelling.  HPI:  UTI follow-up Ms. Hendrie had a telemedicine appointment on 03/18/2019 and reported flank pain and fatigue and chills.  She was diagnosed with likely urinary tract infection due to her symptoms and her past medical history of frequent UTIs and pyelonephritis.  She was given a prescription for Levaquin 500 mg daily for 5 days at that time.  On 7/26, she presented to the emergency department with right-sided back pain.  UA had positive nitrates but was otherwise reassuring.  CT abdomen/pelvis did not show signs of pyelonephritis.  However, given her history, her course of Levaquin was extended for another 5 days.  Today, she presents for follow-up.  She reports that she feels somewhat better since being on antibiotics, but she continues to have right-sided back pain.  She has had chills twice this week but no fever.  She has nausea on some mornings, but this is not abnormal for her.  She denies urinary urgency, frequency, or dysuria.  Her back pain is worsened when she lies supine and when she bends forward and straightens.  She does not think that she was on enough Levaquin to properly treat her urinary tract infection.  Leg swelling She also says that she thinks her baclofen is causing her to retain fluid.  She says that she recently saw her cardiologist and they plan to do some vascular studies on her legs.  She also says her potassium dose was recently adjusted.  Health Maintenance:  Health Maintenance Due  Topic Date Due  . DEXA SCAN  10/18/2010  . TETANUS/TDAP  12/24/2012  . MAMMOGRAM  07/09/2014  . INFLUENZA VACCINE  03/27/2019    -  reports that she quit smoking about 36 years ago. Her smoking use included cigarettes. She has a 38.00 pack-year smoking history. She has never used smokeless  tobacco. - Review of Systems: Per HPI. - Past Medical History: Patient Active Problem List   Diagnosis Date Noted  . Flank pain 03/21/2019  . Lightheadedness 02/19/2019  . Cervical spine disease 10/29/2018  . Grief 10/29/2018  . HNP (herniated nucleus pulposus), lumbar 04/17/2018  . Lower extremity pain, bilateral 12/10/2017  . Breast cancer screening 07/24/2017  . Estrogen deficiency 07/24/2017  . Memory loss 11/06/2016  . Chronic pain syndrome 09/22/2015  . Encounter for chronic pain management 09/22/2015  . Chronic diastolic heart failure ([place]) 02/15/2015  . Hypoalbuminemia 01/10/2015  . Carotid artery stenosis 12/16/2014  . Hyperlipidemia 12/07/2014  . Subclinical hypothyroidism 12/07/2014  . Chest pain 12/05/2014  . Leg swelling 12/05/2014  . Restless leg syndrome 08/29/2014  . Urinary tract infection 08/29/2014  . Lumbar back pain 10/09/2013  . VAGINITIS, ATROPHIC 03/29/2010  . MENOPAUSE-RELATED VASOMOTOR SYMPTOMS, HOT FLASHES 01/25/2010  . DEGENERATIVE JOINT DISEASE, HIPS 01/25/2010  . Migraine without aura 12/06/2008  . ALLERGIC RHINITIS, SEASONAL 12/06/2008  . HYPERTENSION, BENIGN 12/23/2006  . Somatization disorder 10/23/2006  . Sinusitis, chronic 10/23/2006  . GASTROESOPHAGEAL REFLUX, NO ESOPHAGITIS 10/23/2006  . IRRITABLE BOWEL SYNDROME 10/23/2006  . Interstitial cystitis 10/23/2006  . DYSPAREUNIA 10/23/2006  . Osteoarthritis, multiple sites 10/23/2006  . OSTEOPENIA 10/23/2006   - Medications: reviewed and updated   Objective:   Physical Exam BP (!) 148/72   Pulse 77   Wt 170 lb  6.4 oz (77.3 kg)   SpO2 97%   BMI 33.28 kg/m  Gen: NAD, alert, cooperative with exam, well-appearing CV: RRR, good S1/S2, no murmur, 2+ pitting pedal edema bilaterally Resp: CTABL, no wheezes, non-labored Abd: SNTND, BS present, no guarding or organomegaly Musculoskeletal: Lumbar spine: No visual abnormality.  No point tenderness, however there is tenderness to palpation of  the right lower back.  Full ROM but pain is elicited on lumbar extension.  Neurovascularly intact.  Normal gait. Neuro: no gross deficits.  Psych: Appears mildly anxious        Assessment & Plan:   Lumbar back pain This is likely secondary to musculoskeletal pain rather than a kidney infection.  UA is within normal limits today.  Location of pain is inferior to CVA region, and CT scan in the emergency department did not show signs of pyelonephritis.  Patient was reassured that she does not currently have a kidney infection and she was given back exercises to help decrease her back pain.  She was also given a refill of her Robaxin.  We will stop her baclofen due to her concern that it is increasing her volume overload.  Leg swelling We will check a BMP and a BNP as well as a TSH today.  We will stop the baclofen on patient's request.  Patient was encouraged to elevate her feet when she got home and to reduce sodium intake.  I will defer to patient's cardiologist for any changes to her Lasix dose.    Lezlie Octave, M.D. 04/02/2019, 7:53 AM PGY-3, [facility]

## 2019-04-01 NOTE — Addendum Note (Signed)
Addended by: Crissie Reese on: 04/01/2019 04:35 PM   Modules accepted: Orders

## 2019-04-02 DIAGNOSIS — M75102 Unspecified rotator cuff tear or rupture of left shoulder, not specified as traumatic: Secondary | ICD-10-CM | POA: Diagnosis not present

## 2019-04-02 DIAGNOSIS — M75101 Unspecified rotator cuff tear or rupture of right shoulder, not specified as traumatic: Secondary | ICD-10-CM | POA: Diagnosis not present

## 2019-04-02 LAB — BASIC METABOLIC PANEL
BUN/Creatinine Ratio: 21 (ref 12–28)
BUN: 13 mg/dL (ref 8–27)
CO2: 24 mmol/L (ref 20–29)
Calcium: 9.7 mg/dL (ref 8.7–10.3)
Chloride: 104 mmol/L (ref 96–106)
Creatinine, Ser: 0.62 mg/dL (ref 0.57–1.00)
GFR calc Af Amer: 103 mL/min/{1.73_m2} (ref >59)
GFR calc non Af Amer: 90 mL/min/{1.73_m2} (ref >59)
Glucose: 98 mg/dL (ref 65–99)
Potassium: 4.8 mmol/L (ref 3.5–5.2)
Sodium: 140 mmol/L (ref 134–144)

## 2019-04-02 LAB — BRAIN NATRIURETIC PEPTIDE: BNP: 40.2 pg/mL (ref 0.0–100.0)

## 2019-04-02 LAB — TSH: TSH: 2.97 u[IU]/mL (ref 0.450–4.500)

## 2019-04-02 NOTE — Assessment & Plan Note (Signed)
This is likely secondary to musculoskeletal pain rather than a kidney infection.  UA is within normal limits today.  Location of pain is inferior to CVA region, and CT scan in the emergency department did not show signs of pyelonephritis.  Patient was reassured that she does not currently have a kidney infection and she was given back exercises to help decrease her back pain.  She was also given a refill of her Robaxin.  We will stop her baclofen due to her concern that it is increasing her volume overload.

## 2019-04-02 NOTE — Assessment & Plan Note (Addendum)
We will check a BMP and a BNP as well as a TSH today.  We will stop the baclofen on patient's request.  Patient was encouraged to elevate her feet when she got home and to reduce sodium intake.  I will defer to patient's cardiologist for any changes to her Lasix dose.

## 2019-04-05 ENCOUNTER — Other Ambulatory Visit: Payer: Self-pay

## 2019-04-05 ENCOUNTER — Ambulatory Visit (HOSPITAL_COMMUNITY)
Admission: RE | Admit: 2019-04-05 | Discharge: 2019-04-05 | Disposition: A | Discharge status: Home / Self Care | Payer: Medicare Other | Source: Ambulatory Visit | Attending: Cardiology | Admitting: Cardiology

## 2019-04-05 ENCOUNTER — Telehealth: Payer: Self-pay | Admitting: *Deleted

## 2019-04-05 DIAGNOSIS — I739 Peripheral vascular disease, unspecified: Secondary | ICD-10-CM | POA: Insufficient documentation

## 2019-04-05 DIAGNOSIS — M7989 Other specified soft tissue disorders: Secondary | ICD-10-CM | POA: Diagnosis not present

## 2019-04-05 NOTE — Telephone Encounter (Signed)
-----   Message from Kathrene Alu, MD sent at 04/02/2019  4:37 PM EDT ----- Would you let patient know that her blood work was normal from her last visit with me?  Thank you

## 2019-04-05 NOTE — Telephone Encounter (Signed)
Pt informed of below.Theresa Brennan, CMA ? ?

## 2019-04-06 LAB — BASIC METABOLIC PANEL
BUN/Creatinine Ratio: 20 (ref 12–28)
BUN: 13 mg/dL (ref 8–27)
CO2: 24 mmol/L (ref 20–29)
Calcium: 9.5 mg/dL (ref 8.7–10.3)
Chloride: 103 mmol/L (ref 96–106)
Creatinine, Ser: 0.65 mg/dL (ref 0.57–1.00)
GFR calc Af Amer: 102 mL/min/{1.73_m2} (ref >59)
GFR calc non Af Amer: 88 mL/min/{1.73_m2} (ref >59)
Glucose: 99 mg/dL (ref 65–99)
Potassium: 4.7 mmol/L (ref 3.5–5.2)
Sodium: 143 mmol/L (ref 134–144)

## 2019-04-16 ENCOUNTER — Other Ambulatory Visit: Payer: Self-pay | Admitting: Family Medicine

## 2019-04-22 ENCOUNTER — Telehealth: Payer: Self-pay | Admitting: *Deleted

## 2019-04-22 DIAGNOSIS — N3000 Acute cystitis without hematuria: Secondary | ICD-10-CM

## 2019-04-22 MED ORDER — CEPHALEXIN 250 MG PO CAPS: 250.0000 mg | ORAL_CAPSULE | Freq: Four times a day (QID) | ORAL | 0 refills | Status: DC

## 2019-04-22 NOTE — Telephone Encounter (Signed)
Called: "I know I have a urinary infection."  CO severe urgency, frequency and dysuria.  No fever, nausea or vomiting.  Lower abd pain on voiding.  Has taken keflex before despite PCN allergy.  Sent in keflex for presumed UTI.

## 2019-04-22 NOTE — Telephone Encounter (Signed)
Pt states that she woke up this am with polyuria, dysuria and abd pain and low back pain and thinks that she has a UTI.   She tried an AZO but it did not help.  She is requesting an abx.  We have no openings left this week.  Will forward to PCP as pt does not want to go to urgent care. Christen Bame, CMA

## 2019-05-04 DIAGNOSIS — G43B0 Ophthalmoplegic migraine, not intractable: Secondary | ICD-10-CM | POA: Diagnosis not present

## 2019-05-04 DIAGNOSIS — H43813 Vitreous degeneration, bilateral: Secondary | ICD-10-CM | POA: Diagnosis not present

## 2019-05-04 DIAGNOSIS — H353131 Nonexudative age-related macular degeneration, bilateral, early dry stage: Secondary | ICD-10-CM | POA: Diagnosis not present

## 2019-05-04 DIAGNOSIS — H1713 Central corneal opacity, bilateral: Secondary | ICD-10-CM | POA: Diagnosis not present

## 2019-05-04 DIAGNOSIS — Z961 Presence of intraocular lens: Secondary | ICD-10-CM | POA: Diagnosis not present

## 2019-05-05 ENCOUNTER — Other Ambulatory Visit: Payer: Self-pay | Admitting: Family Medicine

## 2019-05-06 ENCOUNTER — Other Ambulatory Visit: Payer: Self-pay

## 2019-05-06 ENCOUNTER — Telehealth (INDEPENDENT_AMBULATORY_CARE_PROVIDER_SITE_OTHER): Payer: Medicare Other | Admitting: Family Medicine

## 2019-05-06 DIAGNOSIS — J0111 Acute recurrent frontal sinusitis: Secondary | ICD-10-CM

## 2019-05-06 MED ORDER — DOXYCYCLINE HYCLATE 100 MG PO TABS: 100.0000 mg | ORAL_TABLET | Freq: Two times a day (BID) | ORAL | 0 refills | Status: DC

## 2019-05-06 NOTE — Progress Notes (Addendum)
[place] Telemedicine Visit  Patient consented to have virtual visit. Method of visit: Telephone  Encounter participants: Patient: Theresa Brennan - located at home Provider: Matilde Haymaker - located at [facility] Others (if applicable): none  Chief Complaint: headache/sinus   HPI:  Headache/sinus pressure Ms. Betzold reports she has been having right ear pain and side and sinus pressure for the past 4 days.  She has been having pain below and above her eyes in addition to around her nose.  She does not have any significant mucus production but is experiencing some postnasal drip with mild sore throat.  Is also been experiencing some right ear pain which is now tender to the touch.  She denies any significant swelling of her right ear or behind her right ear.  She has had multiple surgeries in ear infections of her right ear in the past.  She currently has tympanostomy tube per report.  This ear pain accompanied by her sinus pressure feels very similar to her previous sinus infections.  Additionally, she has been experiencing migraines for the past 4 days and has been taking Imitrex without significant improvement.   She has not been taking her temperature but denies subjective fevers.  She denies cough and sick contacts.  She has been having mild nausea without vomiting.  She is able to keep her food down and has been urinating regularly.  She has allergies to multiple NSAIDs.  She is not yet tried taking Tylenol.  She has taken Afrin once but is wary about continued use of Afrin because she knows it can lead to recurrence.  ROS: per HPI  Pertinent PMHx: Previous history of migraines and sinus infections, recurrent right ear infections.  Exam:  Respiratory: Breathing comfortably on room air, no issue completing long sentences.  Assessment/Plan:  Acute sinusitis  Based on her presentation and history of recurrent sinus and ear infections, is very likely a new sinus  infection allergies and history of migraines may also be contributing.Marland Kitchen  Her symptoms of only been significant for the past 4 days she is not aware of any fever.  Under normal circumstances, this would be a presumed viral sinusitis not requiring antibiotics.  She was informed that typically, we would not be treating this with antibiotics but based on her history and her lack of physical exam, will move forward with antibiotic therapy.  She was encouraged to seek a face-to-face encounter if she does not experience significant symptom improvement in the next 48 hours. -Tylenol PRN for discomfort -Afrin for up to 3 days and then discontinue -Encouraged to avoid Sudafed due to age -Doxycycline 100 mg twice daily for 7 days (amox allergy) -Seek a face-to-face provider encounter if no symptom improvement in the next 48 hours  Time spent during visit with patient: 15 minutes

## 2019-05-10 ENCOUNTER — Telehealth: Payer: Self-pay | Admitting: Family Medicine

## 2019-05-10 NOTE — Telephone Encounter (Signed)
Patient called to inform her PCP that her head and face hurts really bad, and that her antibiotics aren't helping her. Please give pt a call back.

## 2019-05-11 ENCOUNTER — Telehealth (INDEPENDENT_AMBULATORY_CARE_PROVIDER_SITE_OTHER): Payer: Medicare Other | Admitting: Family Medicine

## 2019-05-11 ENCOUNTER — Other Ambulatory Visit: Payer: Self-pay

## 2019-05-11 DIAGNOSIS — G43019 Migraine without aura, intractable, without status migrainosus: Secondary | ICD-10-CM

## 2019-05-11 NOTE — Progress Notes (Signed)
[place] Telemedicine Visit  Patient consented to have virtual visit. Method of visit: tele  Encounter participants: Patient: Theresa Brennan - located at home Provider: Lind Covert - located at office Others (if applicable): no  Chief Complaint: HA  Time spent during visit with patient: 10 minutes   [name] is a 73 y.o. female is presenting with the following  HEADACHE Mainly left sided migraine type headache. Nauseous.   Imitrex not helping took once this am. Zofran not helping a day ago.   No fever or cough or shortness of breath or stiff neck.  No vision changes or weakness with this headache    Objective Vital Signs reviewed There were no vitals taken for this visit.  Normal sounding voice and knew her medications  Assessments/Plans  Migraine Headache - Sounds to be her regular migraine with no symptoms of bleed or CNS infection.  Might be worsened due to sinusitis (she is taking all her medications for this) Recommend take another imitrex and zofran and lie down.  If does not resolve then would need inperson visit.  She agrees

## 2019-05-11 NOTE — Telephone Encounter (Signed)
Patient had virtual visit today with Dr. Erin Hearing for this problem

## 2019-05-14 ENCOUNTER — Telehealth: Payer: Self-pay | Admitting: Family Medicine

## 2019-05-14 DIAGNOSIS — I5032 Chronic diastolic (congestive) heart failure: Secondary | ICD-10-CM

## 2019-05-14 NOTE — Telephone Encounter (Signed)
Patient needs her potassium refilled as the pharmacy said it is not time yet, but she has had to take more per Dr. Andria Frames and now is out.  The pharmacy says they need an okay or new prescription from Dr. Andria Frames.  [facility] in [place], [phone].

## 2019-05-17 MED ORDER — FUROSEMIDE 40 MG PO TABS: 40.0000 mg | ORAL_TABLET | Freq: Two times a day (BID) | ORAL | 1 refills | Status: DC

## 2019-05-17 MED ORDER — POTASSIUM CHLORIDE CRYS ER 20 MEQ PO TBCR: 40.0000 meq | EXTENDED_RELEASE_TABLET | Freq: Two times a day (BID) | ORAL | 3 refills | Status: DC

## 2019-05-17 NOTE — Telephone Encounter (Signed)
Called and verified dose and frequency of lasix and potassium.   Refills sent.

## 2019-05-24 ENCOUNTER — Ambulatory Visit: Payer: Medicare Other | Admitting: Neurology

## 2019-05-24 ENCOUNTER — Telehealth: Payer: Self-pay

## 2019-05-24 NOTE — Telephone Encounter (Signed)
Patient no show for new pt appt. 

## 2019-05-25 ENCOUNTER — Encounter: Payer: Self-pay | Admitting: Neurology

## 2019-05-26 ENCOUNTER — Telehealth: Payer: Self-pay | Admitting: Neurology

## 2019-05-26 ENCOUNTER — Ambulatory Visit: Payer: Medicare Other | Admitting: Neurology

## 2019-05-26 NOTE — Telephone Encounter (Signed)
Patient no-showed her first new patient appointment 2 days ago. Today she came in at 2:49 for a 2pm arrival time. We apologized but asked to reschedule her and she became upset and did not want to be rescheduled. thanks

## 2019-05-28 ENCOUNTER — Telehealth: Payer: Self-pay | Admitting: Family Medicine

## 2019-05-28 DIAGNOSIS — G43009 Migraine without aura, not intractable, without status migrainosus: Secondary | ICD-10-CM

## 2019-05-28 NOTE — Telephone Encounter (Signed)
Pt is calling and would like to have another neurology referral placed. She spoke with [facility] and she said she could not wait until November to be seen.  Pt would like a referral to be sent to [facility] with Dr. Karl Luke at the [place]. She said this is closer to her house as well.

## 2019-05-31 ENCOUNTER — Encounter: Payer: Self-pay | Admitting: Neurology

## 2019-06-01 NOTE — Telephone Encounter (Signed)
Pt is returning Dr. Mike Craze phone call. Pt would like to speak with Dr. Andria Frames.

## 2019-06-01 NOTE — Telephone Encounter (Signed)
Called and LM because no answer.  Explained that I needed to better understand her no show and late arrival to neurologist I had already referred her to before I can place another referral. (Details documented in chart review, encounters.)

## 2019-06-02 DIAGNOSIS — H7291 Unspecified perforation of tympanic membrane, right ear: Secondary | ICD-10-CM | POA: Diagnosis not present

## 2019-06-02 DIAGNOSIS — M542 Cervicalgia: Secondary | ICD-10-CM | POA: Diagnosis not present

## 2019-06-02 NOTE — Telephone Encounter (Signed)
Spoke with patient.  She did not recognize the dissonence between "I need to be seen right away - I can't take the migraines any more." and the fact that she missed two appointments.  Per patient  Cancelled first appointment under the 24 hour window because she had been up all night with "my baby", by which I believe she means her pet.  Second appointment "I was only 16 minutes late [place]  "That is not my fault." Of course she does get some benefit of the doubt because of the recent death of her husband.  Because of that benefit of the doubt, I did agree to put in another neuro referral.

## 2019-06-13 ENCOUNTER — Other Ambulatory Visit: Payer: Self-pay | Admitting: Family Medicine

## 2019-06-21 ENCOUNTER — Telehealth: Payer: Self-pay | Admitting: Family Medicine

## 2019-06-21 NOTE — Telephone Encounter (Signed)
Patient has itchy spots on her hands and she believes they are shingles. Patient preferred to see Andria Frames but his first avail is Nov. 2nd. Told patient Andria Frames will call her tomorrow.

## 2019-06-22 NOTE — Telephone Encounter (Signed)
Concern is for shingles outbreak on left side of head/scalp.  I told patient that I would work her in at 1:30 on Thursday.

## 2019-06-24 ENCOUNTER — Ambulatory Visit (INDEPENDENT_AMBULATORY_CARE_PROVIDER_SITE_OTHER): Payer: Medicare Other | Admitting: Family Medicine

## 2019-06-24 ENCOUNTER — Other Ambulatory Visit: Payer: Self-pay

## 2019-06-24 ENCOUNTER — Encounter: Payer: Self-pay | Admitting: Family Medicine

## 2019-06-24 DIAGNOSIS — B029 Zoster without complications: Secondary | ICD-10-CM

## 2019-06-24 DIAGNOSIS — I6521 Occlusion and stenosis of right carotid artery: Secondary | ICD-10-CM

## 2019-06-24 MED ORDER — VALACYCLOVIR HCL 1 G PO TABS: 1000.0000 mg | ORAL_TABLET | Freq: Two times a day (BID) | ORAL | 0 refills | Status: DC

## 2019-06-24 NOTE — Patient Instructions (Signed)
I am not sure if this is shingles or not.  If it is shingles, it is only a mild case.  Because the treatment is so benign, I think it is best to treat you and I sent in the prescription to [facility].   I will try to remember in one week to send in the new shingles vaccine, Shingrix.  It would be good for you to call and remind me.
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
## 2019-06-24 NOTE — Assessment & Plan Note (Signed)
I believe this is most likely a mild flair of shingles.  Could be neurodematitis.  Will treat as shingles.

## 2019-07-01 ENCOUNTER — Other Ambulatory Visit: Payer: Self-pay | Admitting: Family Medicine

## 2019-07-01 DIAGNOSIS — B029 Zoster without complications: Secondary | ICD-10-CM

## 2019-07-02 ENCOUNTER — Other Ambulatory Visit: Payer: Self-pay | Admitting: Family Medicine

## 2019-07-02 DIAGNOSIS — Z1231 Encounter for screening mammogram for malignant neoplasm of breast: Secondary | ICD-10-CM

## 2019-07-07 ENCOUNTER — Other Ambulatory Visit: Payer: Self-pay

## 2019-07-07 ENCOUNTER — Ambulatory Visit
Admission: RE | Admit: 2019-07-07 | Discharge: 2019-07-07 | Disposition: A | Discharge status: Home / Self Care | Payer: Medicare Other | Source: Ambulatory Visit | Attending: Family Medicine | Admitting: Family Medicine

## 2019-07-07 DIAGNOSIS — Z1231 Encounter for screening mammogram for malignant neoplasm of breast: Secondary | ICD-10-CM | POA: Diagnosis not present

## 2019-07-08 ENCOUNTER — Other Ambulatory Visit: Payer: Self-pay | Admitting: Gastroenterology

## 2019-07-09 ENCOUNTER — Telehealth: Payer: Self-pay | Admitting: Gastroenterology

## 2019-07-09 MED ORDER — OMEPRAZOLE 40 MG PO CPDR: DELAYED_RELEASE_CAPSULE | ORAL | 1 refills | Status: DC

## 2019-07-09 NOTE — Telephone Encounter (Signed)
Prescription sent

## 2019-07-11 ENCOUNTER — Other Ambulatory Visit: Payer: Self-pay | Admitting: Family Medicine

## 2019-07-13 DIAGNOSIS — M75101 Unspecified rotator cuff tear or rupture of right shoulder, not specified as traumatic: Secondary | ICD-10-CM | POA: Diagnosis not present

## 2019-07-13 DIAGNOSIS — M7522 Bicipital tendinitis, left shoulder: Secondary | ICD-10-CM | POA: Diagnosis not present

## 2019-07-14 ENCOUNTER — Telehealth: Payer: Self-pay | Admitting: Family Medicine

## 2019-07-14 NOTE — Telephone Encounter (Signed)
Patient needs a prescription to get a shingles shot.  Please call and let her know if she also needs to make an appt with [place] and when she pick this up.  Thanks.

## 2019-07-15 ENCOUNTER — Telehealth: Payer: Self-pay | Admitting: Family Medicine

## 2019-07-15 MED ORDER — ZOSTER VACCINE LIVE 19400 UNT/0.65ML ~~LOC~~ SUSR: 0.6500 mL | Freq: Once | SUBCUTANEOUS | 1 refills | Status: AC

## 2019-07-15 NOTE — Telephone Encounter (Signed)
Patient missed your call, please try her again, regarding the shingles shot.

## 2019-07-15 NOTE — Telephone Encounter (Signed)
Called patient.  Rx sent as requested.  Warned of side effects.  Verified pharmacy.

## 2019-07-15 NOTE — Telephone Encounter (Signed)
Done

## 2019-07-28 DIAGNOSIS — G43909 Migraine, unspecified, not intractable, without status migrainosus: Secondary | ICD-10-CM | POA: Diagnosis not present

## 2019-08-03 ENCOUNTER — Other Ambulatory Visit: Payer: Self-pay | Admitting: Family Medicine

## 2019-08-06 ENCOUNTER — Other Ambulatory Visit: Payer: Self-pay | Admitting: Family Medicine

## 2019-08-16 ENCOUNTER — Telehealth (INDEPENDENT_AMBULATORY_CARE_PROVIDER_SITE_OTHER): Payer: Medicare Other | Admitting: Family Medicine

## 2019-08-16 ENCOUNTER — Other Ambulatory Visit: Payer: Self-pay

## 2019-08-16 DIAGNOSIS — J328 Other chronic sinusitis: Secondary | ICD-10-CM | POA: Diagnosis not present

## 2019-08-16 DIAGNOSIS — J329 Chronic sinusitis, unspecified: Secondary | ICD-10-CM

## 2019-08-16 DIAGNOSIS — I6521 Occlusion and stenosis of right carotid artery: Secondary | ICD-10-CM | POA: Diagnosis not present

## 2019-08-16 MED ORDER — FLUTICASONE PROPIONATE 50 MCG/ACT NA SUSP: 2.0000 | Freq: Every day | NASAL | 6 refills | Status: DC

## 2019-08-16 MED ORDER — SALINE SPRAY 0.65 % NA SOLN: 1.0000 | NASAL | 0 refills | Status: DC | PRN

## 2019-08-16 MED ORDER — CEPHALEXIN 500 MG PO CAPS: 500.0000 mg | ORAL_CAPSULE | Freq: Four times a day (QID) | ORAL | 0 refills | Status: AC

## 2019-08-16 MED ORDER — CETIRIZINE HCL 10 MG PO TABS: 10.0000 mg | ORAL_TABLET | Freq: Every day | ORAL | 11 refills | Status: DC

## 2019-08-16 NOTE — Progress Notes (Signed)
[facility] Tele[facility]icine Visit  Patient consented to have virtual visit. Method of visit: Video was attempted, but technology challenges prevented patient from using video, so visit was conducted via telephone.  Encounter participants: Patient: Theresa Brennan - located at Home Provider: Danna Hefty - located at [facility] Others (if applicable): None  Chief Complaint: Sinus Pain  HPI: Sinus pressure, headache, and right ear pain x ~1.5 weeks. Notes her right ear started hurting about a week and a half ago then she started having worsening sinus pain and now has a terrible headache since 12/14. She denies any fevers/chills but has had to have the house cold because she cant afford the heat bill. She notes that the pain is located on her cheeks and above her eyes. She notes when she taps on her face the pain is worse on the right cheek and right eyebrow. She notes she has history of sinus infections. 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 She has been treating with Flonase and Imatrex. Also endorses congestion. Denies cough, SOB or difficulty breathing.  She also endorses dysuria and urgency and frequency x 1 day. Went to pharmacy and picked up Azo. Denies hematuria.   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 Symptoms have been occurring four about 10 days without any improvement. Patient sound ill during encounter.   Has allergy to PCN's with swelling and rash. Has taken Keflex before and notes she did okay with  that medicine.  Begin Keflex 500mg  QID x 5 days  Continue environmental control measures.    May use over the counter antihistamines such as Zyrtec (cetirizine), Claritin (loratadine), Allegra (fexofenadine), or Xyzal (levocetirizine) daily as needed. May take it twice a day if needed.  May use Flonase 1-2 sprays twice a day if needed for sinus issues.   Patient may use Afrin spray as she has this at home already. She was instructed to only use this for 3 days. She understood and agreed to plan.  Nasal saline spray (i.e., Simply Saline) or nasal saline lavage (i.e., NeilMed) is recommended as needed and prior to medicated nasal sprays.  Strict return precautions discussed including worsening pain particularly behind ear, headache, inability to maintain adequate hydration, lack of improvement with antibiotics  Dysuria: One day history of dysuria and increased frequency. Will concomitantly treat UTI and sinus infection with Keflex.  Time spent during visit with patient: 21 minutes  Mina Marble, [place], PGY2 08/16/2019

## 2019-08-17 NOTE — Assessment & Plan Note (Addendum)
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 Symptoms have been occurring four about 10 days without any improvement. Patient sound ill during encounter.   Has allergy to PCN's with swelling and rash. Has taken Keflex before and notes she did okay with that medicine.  Begin Keflex 500mg  QID x 5 days  Continue environmental control measures.    May use over the counter antihistamines such as Zyrtec (cetirizine), Claritin (loratadine), Allegra (fexofenadine), or Xyzal (levocetirizine) daily as needed. May take it twice a day if needed.  May use Flonase 1-2 sprays twice a day if needed for sinus issues.   Patient may use Afrin spray as she has this at home already. She was instructed to only use this for 3 days. She understood and agreed to plan.  Nasal saline spray (i.e., Simply Saline) or nasal saline lavage (i.e., NeilMed) is recommended as needed and prior to medicated nasal sprays.  Strict return precautions discussed including worsening pain particularly behind ear, headache, inability to maintain adequate hydration, lack of improvement with antibiotics

## 2019-08-18 ENCOUNTER — Other Ambulatory Visit: Payer: Self-pay | Admitting: Family Medicine

## 2019-08-18 ENCOUNTER — Ambulatory Visit: Payer: Medicare Other | Admitting: Gastroenterology

## 2019-08-18 DIAGNOSIS — E785 Hyperlipidemia, unspecified: Secondary | ICD-10-CM
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
## 2019-09-14 ENCOUNTER — Other Ambulatory Visit: Payer: Self-pay | Admitting: Family Medicine

## 2019-09-27 ENCOUNTER — Encounter: Payer: Self-pay | Admitting: Family Medicine

## 2019-09-27 ENCOUNTER — Telehealth (INDEPENDENT_AMBULATORY_CARE_PROVIDER_SITE_OTHER): Payer: Medicare Other | Admitting: Family Medicine

## 2019-09-27 ENCOUNTER — Other Ambulatory Visit: Payer: Self-pay

## 2019-09-27 DIAGNOSIS — N3000 Acute cystitis without hematuria: Secondary | ICD-10-CM

## 2019-09-27 MED ORDER — CEPHALEXIN 500 MG PO CAPS: 500.0000 mg | ORAL_CAPSULE | Freq: Four times a day (QID) | ORAL | 0 refills | Status: AC

## 2019-09-27 NOTE — Progress Notes (Signed)
[facility] Telemedicine Visit  Patient consented to have virtual visit. Method of visit: Telephone  Encounter participants: Patient: Theresa Brennan - located at home Provider: Sandre Kitty - located at [facility] Others (if applicable): None  Chief Complaint: Dysuria  HPI:  Dysuria: Patient states she gets "a lot" of bladder infections.  Most recently was in December.  She started developing acute significant dysuria today.  She took Azo to help with her pain.  She sees a urologist, but states he was booked up today.  Has never had issues with kidney stones.  No hematuria currently.  Patient states Macrobid and Bactrim do not help with her infections.  She has been urinating "every 10 minutes".  Urine appears darker than usual.  Patient denies fever.   ROS: per HPI  Pertinent PMHx: History of multiple UTI, interstitial cystitis  Exam:  Respiratory: Speaking in full sentences without difficulty.  Assessment/Plan:  Urinary tract infection Developed symptoms today.  Patient unable to come in today for urine sample.  Sees a urologist already.  Has a long history of UTIs with multiple resistance.  Responded to Keflex in December. -Keflex 4 times daily for 5 days.    Time spent during visit with patient: 15 minutes

## 2019-09-28 NOTE — Assessment & Plan Note (Signed)
Developed symptoms today.  Patient unable to come in today for urine sample.  Sees a urologist already.  Has a long history of UTIs with multiple resistance.  Responded to Keflex in December. -Keflex 4 times daily for 5 days.

## 2019-10-22 DIAGNOSIS — M7582 Other shoulder lesions, left shoulder: Secondary | ICD-10-CM | POA: Diagnosis not present

## 2019-10-22 DIAGNOSIS — M75102 Unspecified rotator cuff tear or rupture of left shoulder, not specified as traumatic: Secondary | ICD-10-CM | POA: Diagnosis not present

## 2019-10-28 ENCOUNTER — Ambulatory Visit: Payer: Medicare Other | Attending: Internal Medicine

## 2019-10-28 DIAGNOSIS — Z23 Encounter for immunization: Secondary | ICD-10-CM | POA: Insufficient documentation

## 2019-10-28 NOTE — Progress Notes (Signed)
   Covid-19 Vaccination Clinic  Name:  Theresa Brennan    MRN: [id number] DOB: 1946/03/18  10/28/2019  Theresa Brennan was observed post Covid-19 immunization for 15 minutes without incident. She was provided with Vaccine Information Sheet and instruction to access the V-Safe system.   Theresa Brennan was instructed to call 911 with any severe reactions post vaccine: Marland Kitchen Difficulty breathing  . Swelling of face and throat  . A fast heartbeat  . A bad rash all over body  . Dizziness and weakness   Immunizations Administered    Name Date Dose VIS Date Route   Pfizer COVID-19 Vaccine 10/28/2019 11:02 AM 0.3 mL 08/06/2019 Intramuscular   Manufacturer: [employer], [employer]   Lot: [id number]   NDC: [address]-2

## 2019-10-29 DIAGNOSIS — M75102 Unspecified rotator cuff tear or rupture of left shoulder, not specified as traumatic: Secondary | ICD-10-CM | POA: Insufficient documentation

## 2019-11-02 DIAGNOSIS — N959 Unspecified menopausal and perimenopausal disorder: Secondary | ICD-10-CM | POA: Diagnosis not present

## 2019-11-02 DIAGNOSIS — N952 Postmenopausal atrophic vaginitis: Secondary | ICD-10-CM | POA: Diagnosis not present

## 2019-11-03 DIAGNOSIS — M75102 Unspecified rotator cuff tear or rupture of left shoulder, not specified as traumatic: Secondary | ICD-10-CM | POA: Diagnosis not present

## 2019-11-03 DIAGNOSIS — R358 Other polyuria: Secondary | ICD-10-CM | POA: Diagnosis not present

## 2019-11-03 DIAGNOSIS — Z01818 Encounter for other preprocedural examination: Secondary | ICD-10-CM | POA: Diagnosis not present

## 2019-11-03 DIAGNOSIS — E785 Hyperlipidemia, unspecified: Secondary | ICD-10-CM | POA: Diagnosis not present

## 2019-11-03 DIAGNOSIS — I6529 Occlusion and stenosis of unspecified carotid artery: Secondary | ICD-10-CM | POA: Diagnosis not present

## 2019-11-03 DIAGNOSIS — I110 Hypertensive heart disease with heart failure: Secondary | ICD-10-CM | POA: Diagnosis not present

## 2019-11-06 DIAGNOSIS — Z20822 Contact with and (suspected) exposure to covid-19: Secondary | ICD-10-CM | POA: Diagnosis not present

## 2019-11-06 DIAGNOSIS — M75102 Unspecified rotator cuff tear or rupture of left shoulder, not specified as traumatic: Secondary | ICD-10-CM | POA: Diagnosis not present

## 2019-11-06 DIAGNOSIS — Z01812 Encounter for preprocedural laboratory examination: Secondary | ICD-10-CM | POA: Diagnosis not present

## 2019-11-10 DIAGNOSIS — M75122 Complete rotator cuff tear or rupture of left shoulder, not specified as traumatic: Secondary | ICD-10-CM | POA: Diagnosis not present

## 2019-11-10 DIAGNOSIS — M65812 Other synovitis and tenosynovitis, left shoulder: Secondary | ICD-10-CM | POA: Diagnosis not present

## 2019-11-10 DIAGNOSIS — M75102 Unspecified rotator cuff tear or rupture of left shoulder, not specified as traumatic: Secondary | ICD-10-CM | POA: Diagnosis not present

## 2019-11-10 DIAGNOSIS — M7552 Bursitis of left shoulder: Secondary | ICD-10-CM | POA: Diagnosis not present

## 2019-11-10 DIAGNOSIS — G8918 Other acute postprocedural pain: Secondary | ICD-10-CM | POA: Diagnosis not present

## 2019-11-10 HISTORY — PX: SHOULDER SURGERY: SHX246

## 2019-11-23 ENCOUNTER — Telehealth (INDEPENDENT_AMBULATORY_CARE_PROVIDER_SITE_OTHER): Payer: Medicare Other | Admitting: Family Medicine

## 2019-11-23 ENCOUNTER — Other Ambulatory Visit: Payer: Self-pay

## 2019-11-23 DIAGNOSIS — R0989 Other specified symptoms and signs involving the circulatory and respiratory systems: Secondary | ICD-10-CM

## 2019-11-23 DIAGNOSIS — R05 Cough: Secondary | ICD-10-CM | POA: Diagnosis not present

## 2019-11-23 DIAGNOSIS — J069 Acute upper respiratory infection, unspecified: Secondary | ICD-10-CM | POA: Diagnosis not present

## 2019-11-23 DIAGNOSIS — Z87891 Personal history of nicotine dependence: Secondary | ICD-10-CM

## 2019-11-23 DIAGNOSIS — Z7982 Long term (current) use of aspirin: Secondary | ICD-10-CM

## 2019-11-23 DIAGNOSIS — R059 Cough, unspecified: Secondary | ICD-10-CM

## 2019-11-23 NOTE — Assessment & Plan Note (Addendum)
Symptoms appear most consistent with viral URI. She is breathing comfortably on room air and does not appear in any distress.  Patient is at more risk for pneumonia given her recent shoulder surgery on 3/19, however symptoms are not entirely consistent with pneumonia at this time. Will obtain chest x-ray to further evaluate.  In the meantime, I have opted to treat conservatively with symptom management. -Continue Claritin QD -Afrin x3 days, patient instructed to discontinue after 3 days - Discussed at home methods for treating cough, such as a teaspoon of honey prior to bed  - Mucinex to help with chest congestion.  Advised patient to avoid pseudoephedrine given her age. - Also recommended Humidifier, nasal saline, cough lozenges, tylenol PRN for discomfort - Encouraged fluids to maintain adequate hydration - Strict return precautions given. Patient encouraged to call office or go to ED if difficulty breathing occurs.   Advised patient to follow-up in 1 to 2 weeks if no improvement in symptoms sooner if worsening. -We will follow up chest x-ray

## 2019-11-23 NOTE — Progress Notes (Signed)
 [facility] Telemedicine Visit  Patient consented to have virtual visit. Method of visit: Video  Encounter participants: Patient: Theresa Brennan - located at Home Provider: Joana Reamer - located at [facility] Others (if applicable): None  Chief Complaint: cough, congestion, chest tightness  HPI: Patient notes she developed scratchy throat but no sore throat since Friday. Developed more fatigue early Sunday. She has since developed a productive cough. She notes her chest is getting tight. Also endorses decreased appetite. She endorses a little SOB, but thinks it because she cant breath through her nose. Denies any fevers or chills, myalgias, wheezing. She has been using cough drops and Claritin, flonase, afrin x 1, with tylenol PRN. She notes she was exposed to her grandchildren who have been present there since December, left yesterday. They had intermittent colds while they stayed. She notes she had shoulder surgery on 3/17 - she has been getting around okay. She has had her first COVID-19 shot on 10/25/19, she is due for her second shot tomorrow.  ROS: per HPI  Pertinent PMHx: Shoulder surgery on 3/17  Exam:  Gen: sounds unwell  Respiratory: speaking in full sentences, breathing comfortably on room air  Assessment/Plan:  Viral URI with cough Symptoms appear most consistent with viral URI. She is breathing comfortably on room air and does not appear in any distress.  Patient is at more risk for pneumonia given her recent shoulder surgery on 3/19, however symptoms are not entirely consistent with pneumonia at this time. Will obtain chest x-ray to further evaluate.  In the meantime, I have opted to treat conservatively with symptom management. -Continue Claritin QD -Afrin x3 days, patient instructed to discontinue after 3 days - Discussed at home methods for treating cough, such as a teaspoon of honey prior to bed  - Mucinex to help with chest congestion.  Advised  patient to avoid pseudoephedrine given her age. - Also recommended Humidifier, nasal saline, cough lozenges, tylenol PRN for discomfort - Encouraged fluids to maintain adequate hydration - Strict return precautions given. Patient encouraged to call office or go to ED if difficulty breathing occurs.   Advised patient to follow-up in 1 to 2 weeks if no improvement in symptoms sooner if worsening. -We will follow up chest x-ray  Total time: 25 minutes. This includes time spent with the patient during the visit as well as time spent before and after the visit reviewing the chart, documenting the encounter, making phone calls, reviewing studies, etc.  Orpah Cobb, DO [facility], PGY2 11/23/19

## 2019-11-24 ENCOUNTER — Other Ambulatory Visit: Payer: Self-pay

## 2019-11-24 ENCOUNTER — Ambulatory Visit (HOSPITAL_COMMUNITY)
Admission: RE | Admit: 2019-11-24 | Discharge: 2019-11-24 | Disposition: A | Discharge status: Home / Self Care | Payer: Medicare Other | Source: Ambulatory Visit | Attending: Family Medicine | Admitting: Family Medicine

## 2019-11-24 ENCOUNTER — Encounter: Payer: Self-pay | Admitting: Family Medicine

## 2019-11-24 ENCOUNTER — Ambulatory Visit: Payer: Medicare Other | Attending: Internal Medicine

## 2019-11-24 ENCOUNTER — Ambulatory Visit (INDEPENDENT_AMBULATORY_CARE_PROVIDER_SITE_OTHER): Payer: Medicare Other | Admitting: Family Medicine

## 2019-11-24 VITALS — Wt 156.4 lb

## 2019-11-24 DIAGNOSIS — R059 Cough, unspecified: Secondary | ICD-10-CM

## 2019-11-24 DIAGNOSIS — R05 Cough: Secondary | ICD-10-CM | POA: Diagnosis not present

## 2019-11-24 DIAGNOSIS — N3000 Acute cystitis without hematuria: Secondary | ICD-10-CM

## 2019-11-24 DIAGNOSIS — R0989 Other specified symptoms and signs involving the circulatory and respiratory systems: Secondary | ICD-10-CM

## 2019-11-24 DIAGNOSIS — Z23 Encounter for immunization: Secondary | ICD-10-CM

## 2019-11-24 DIAGNOSIS — R399 Unspecified symptoms and signs involving the genitourinary system: Secondary | ICD-10-CM

## 2019-11-24 DIAGNOSIS — Z87891 Personal history of nicotine dependence: Secondary | ICD-10-CM | POA: Diagnosis not present

## 2019-11-24 DIAGNOSIS — Z7982 Long term (current) use of aspirin: Secondary | ICD-10-CM

## 2019-11-24 LAB — POCT UA - MICROSCOPIC ONLY

## 2019-11-24 MED ORDER — CEPHALEXIN 500 MG PO CAPS: 500.0000 mg | ORAL_CAPSULE | Freq: Four times a day (QID) | ORAL | 0 refills | Status: AC

## 2019-11-24 NOTE — Progress Notes (Signed)
   Covid-19 Vaccination Clinic  Name:  AANYA HAYNES    MRN: [id number] DOB: [birth year]  11/24/2019  Ms. Pete was observed post Covid-19 immunization for 15 minutes without incident. She was provided with Vaccine Information Sheet and instruction to access the V-Safe system.   Ms. Aguillard was instructed to call 911 with any severe reactions post vaccine: Marland Kitchen Difficulty breathing  . Swelling of face and throat  . A fast heartbeat  . A bad rash all over body  . Dizziness and weakness   Immunizations Administered    Name Date Dose VIS Date Route   Pfizer COVID-19 Vaccine 11/24/2019 12:15 PM 0.3 mL 08/06/2019 Intramuscular   Manufacturer: [employer], [employer]   Lot: [id number]   NDC: [address]-9

## 2019-11-24 NOTE — Progress Notes (Signed)
[facility] Telemedicine Visit  Patient consented to have virtual visit. Method of visit: Video  Encounter participants: Patient: Theresa Brennan - located in car  Provider: Allayne Stack - located at [facility] Others (if applicable): none   Chief Complaint: UTI   HPI: [name] is a 74 year old female presenting via video to discuss the following:  UTI: Started feeling symptoms of a bladder infection starting this morning.  She has a known history of frequent urinary complaints with interstitial cystitis, follows with urology.  Reports dysuria, urinary frequency, urgency, and suprapubic tenderness with right-sided back spasming.  Denies any fever, CVA tenderness to palpation.  She has tried Bactrim and Macrobid in the past without relief.  Recently used Keflex in February and December with resolution of symptoms.  She has been taking Azo since this morning.  Came up to the clinic earlier today and provided a urine sample, only able to obtain culture due to using Azo.  ROS: per HPI  Pertinent PMHx: Interstitial cystitis, low back pain with chronic pain management, hypertension, carotid artery stenosis, migraines, hypothyroidism, hyperlipidemia  Exam:  General: Sitting in car in no acute distress Respiratory: No increased work of breathing, speaking in full sentences Psych: Pleasant, appropriate mood and affect  Assessment/Plan:  Urinary tract infection Suspected UTI due to dysuria, urinary frequency and suprapubic tenderness with numerous WBC on microscopic evaluation.  Reassuringly without clinical symptoms suggestive of pyelonephritis.  Pt provided urine culture, will follow up with results.  As she has had success with Keflex in the past, will send in Keflex 4 times daily for 7 days.  Recommended continued follow-up with her urologist for management of interstitial cystitis to hopefully improve frequent symptomatology.    Follow-up if not improving or sooner if  worsening.  Time spent during visit with patient: 12 minutes  Allayne Stack, DO

## 2019-11-24 NOTE — Assessment & Plan Note (Addendum)
Suspected UTI due to dysuria, urinary frequency and suprapubic tenderness with numerous WBC on microscopic evaluation.  Reassuringly without clinical symptoms suggestive of pyelonephritis.  Pt provided urine culture, will follow up with results.  As she has had success with Keflex in the past, will send in Keflex 4 times daily for 7 days.  Recommended continued follow-up with her urologist for management of interstitial cystitis to hopefully improve frequent symptomatology.

## 2019-11-25 ENCOUNTER — Telehealth: Payer: Self-pay | Admitting: Family Medicine

## 2019-11-25 DIAGNOSIS — M75102 Unspecified rotator cuff tear or rupture of left shoulder, not specified as traumatic: Secondary | ICD-10-CM | POA: Diagnosis not present

## 2019-11-25 DIAGNOSIS — M25512 Pain in left shoulder: Secondary | ICD-10-CM | POA: Diagnosis not present

## 2019-11-25 DIAGNOSIS — M6281 Muscle weakness (generalized): Secondary | ICD-10-CM | POA: Diagnosis not present

## 2019-11-25 NOTE — Telephone Encounter (Signed)
Called patient and informed normal CXR

## 2019-11-26 LAB — URINE CULTURE

## 2019-12-01 DIAGNOSIS — M25512 Pain in left shoulder: Secondary | ICD-10-CM | POA: Diagnosis not present

## 2019-12-01 DIAGNOSIS — M75102 Unspecified rotator cuff tear or rupture of left shoulder, not specified as traumatic: Secondary | ICD-10-CM | POA: Diagnosis not present

## 2019-12-01 DIAGNOSIS — M6281 Muscle weakness (generalized): Secondary | ICD-10-CM | POA: Diagnosis not present

## 2019-12-03 DIAGNOSIS — M75102 Unspecified rotator cuff tear or rupture of left shoulder, not specified as traumatic: Secondary | ICD-10-CM | POA: Diagnosis not present

## 2019-12-03 DIAGNOSIS — M6281 Muscle weakness (generalized): Secondary | ICD-10-CM | POA: Diagnosis not present

## 2019-12-03 DIAGNOSIS — M25512 Pain in left shoulder: Secondary | ICD-10-CM | POA: Diagnosis not present

## 2019-12-05 ENCOUNTER — Other Ambulatory Visit: Payer: Self-pay | Admitting: Family Medicine

## 2019-12-05 DIAGNOSIS — E785 Hyperlipidemia, unspecified: Secondary | ICD-10-CM

## 2019-12-07 DIAGNOSIS — M75102 Unspecified rotator cuff tear or rupture of left shoulder, not specified as traumatic: Secondary | ICD-10-CM | POA: Diagnosis not present

## 2019-12-07 DIAGNOSIS — M6281 Muscle weakness (generalized): Secondary | ICD-10-CM | POA: Diagnosis not present

## 2019-12-07 DIAGNOSIS — M25512 Pain in left shoulder: Secondary | ICD-10-CM | POA: Diagnosis not present

## 2019-12-09 DIAGNOSIS — M75102 Unspecified rotator cuff tear or rupture of left shoulder, not specified as traumatic: Secondary | ICD-10-CM | POA: Diagnosis not present

## 2019-12-09 DIAGNOSIS — M25512 Pain in left shoulder: Secondary | ICD-10-CM | POA: Diagnosis not present

## 2019-12-09 DIAGNOSIS — M6281 Muscle weakness (generalized): Secondary | ICD-10-CM | POA: Diagnosis not present

## 2019-12-14 DIAGNOSIS — M6281 Muscle weakness (generalized): Secondary | ICD-10-CM | POA: Diagnosis not present

## 2019-12-14 DIAGNOSIS — M25512 Pain in left shoulder: Secondary | ICD-10-CM | POA: Diagnosis not present

## 2019-12-14 DIAGNOSIS — M75102 Unspecified rotator cuff tear or rupture of left shoulder, not specified as traumatic: Secondary | ICD-10-CM | POA: Diagnosis not present

## 2019-12-16 DIAGNOSIS — M75102 Unspecified rotator cuff tear or rupture of left shoulder, not specified as traumatic: Secondary | ICD-10-CM | POA: Diagnosis not present

## 2019-12-16 DIAGNOSIS — M6281 Muscle weakness (generalized): Secondary | ICD-10-CM | POA: Diagnosis not present

## 2019-12-16 DIAGNOSIS — M25512 Pain in left shoulder: Secondary | ICD-10-CM | POA: Diagnosis not present

## 2019-12-20 ENCOUNTER — Other Ambulatory Visit: Payer: Self-pay | Admitting: Gastroenterology

## 2019-12-21 DIAGNOSIS — M6281 Muscle weakness (generalized): Secondary | ICD-10-CM | POA: Diagnosis not present

## 2019-12-21 DIAGNOSIS — M25512 Pain in left shoulder: Secondary | ICD-10-CM | POA: Diagnosis not present

## 2019-12-21 DIAGNOSIS — M75102 Unspecified rotator cuff tear or rupture of left shoulder, not specified as traumatic: Secondary | ICD-10-CM | POA: Diagnosis not present

## 2019-12-22 ENCOUNTER — Ambulatory Visit (INDEPENDENT_AMBULATORY_CARE_PROVIDER_SITE_OTHER): Payer: Medicare Other

## 2019-12-22 ENCOUNTER — Other Ambulatory Visit: Payer: Self-pay

## 2019-12-22 VITALS — BP 134/76 | HR 87

## 2019-12-22 DIAGNOSIS — Z0000 Encounter for general adult medical examination without abnormal findings: Secondary | ICD-10-CM
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
## 2019-12-23 NOTE — Patient Instructions (Addendum)
You spoke to Theresa Brennan, Bear Dance over the phone for your annual wellness visit.  We discussed goals: Goals    . Exercise 4x per week (30 min per time)     Encouraged patient to increase her walking to 4x per week 30 mins each time with the nicer weather.      We also discussed recommended health maintenance.  As discussed, you are due for a Tdap Vaccine. We can send this to your pharmacy if you are interested, just let us know. We wiill get HM records at your next office visit for your DEXA.   Health Maintenance  Topic Date Due  . DEXA SCAN  Never done  . TETANUS/TDAP  12/24/2012  . INFLUENZA VACCINE  03/26/2020  . MAMMOGRAM  07/06/2020  . COLONOSCOPY  01/24/2021  . COVID-19 Vaccine  Completed  . Hepatitis C Screening  Completed  . PNA vac Low Risk Adult  Completed   I am glad you were able to get your Covid Vaccine! Follow-up with PCP soon. I have mailed you an advance directive packet.  Increase walking with the nicer weather.  Preventive Care 48 Years and Older, Female Preventive care refers to lifestyle choices and visits with your health care provider that can promote health and wellness. This includes:  A yearly physical exam. This is also called an annual well check.  Regular dental and eye exams.  Immunizations.  Screening for certain conditions.  Healthy lifestyle choices, such as diet and exercise. What can I expect for my preventive care visit? Physical exam Your health care provider will check:  Height and weight. These may be used to calculate body mass index (BMI), which is a measurement that tells if you are at a healthy weight.  Heart rate and blood pressure.  Your skin for abnormal spots. Counseling Your health care provider may ask you questions about:  Alcohol, tobacco, and drug use.  Emotional well-being.  Home and relationship well-being.  Sexual activity.  Eating habits.  History of falls.  Memory and ability to understand  (cognition).  Work and work Statistician.  Pregnancy and menstrual history. What immunizations do I need?  Influenza (flu) vaccine  This is recommended every year. Tetanus, diphtheria, and pertussis (Tdap) vaccine  You may need a Td booster every 10 years. Varicella (chickenpox) vaccine  You may need this vaccine if you have not already been vaccinated. Zoster (shingles) vaccine  You may need this after age 34. Pneumococcal conjugate (PCV13) vaccine  One dose is recommended after age 35. Pneumococcal polysaccharide (PPSV23) vaccine  One dose is recommended after age 86. Measles, mumps, and rubella (MMR) vaccine  You may need at least one dose of MMR if you were born in 1957 or later. You may also need a second dose. Meningococcal conjugate (MenACWY) vaccine  You may need this if you have certain conditions. Hepatitis A vaccine  You may need this if you have certain conditions or if you travel or work in places where you may be exposed to hepatitis A. Hepatitis B vaccine  You may need this if you have certain conditions or if you travel or work in places where you may be exposed to hepatitis B. Haemophilus influenzae type b (Hib) vaccine  You may need this if you have certain conditions. You may receive vaccines as individual doses or as more than one vaccine together in one shot (combination vaccines). Talk with your health care provider about the risks and benefits of combination vaccines. What  tests do I need? Blood tests  Lipid and cholesterol levels. These may be checked every 5 years, or more frequently depending on your overall health.  Hepatitis C test.  Hepatitis B test. Screening  Lung cancer screening. You may have this screening every year starting at age 77 if you have a 30-pack-year history of smoking and currently smoke or have quit within the past 15 years.  Colorectal cancer screening. All adults should have this screening starting at age 58 and  continuing until age 35. Your health care provider may recommend screening at age 23 if you are at increased risk. You will have tests every 1-10 years, depending on your results and the type of screening test.  Diabetes screening. This is done by checking your blood sugar (glucose) after you have not eaten for a while (fasting). You may have this done every 1-3 years.  Mammogram. This may be done every 1-2 years. Talk with your health care provider about how often you should have regular mammograms.  BRCA-related cancer screening. This may be done if you have a family history of breast, ovarian, tubal, or peritoneal cancers. Other tests  Sexually transmitted disease (STD) testing.  Bone density scan. This is done to screen for osteoporosis. You may have this done starting at age 30. Follow these instructions at home: Eating and drinking  Eat a diet that includes fresh fruits and vegetables, whole grains, lean protein, and low-fat dairy products. Limit your intake of foods with high amounts of sugar, saturated fats, and salt.  Take vitamin and mineral supplements as recommended by your health care provider.  Do not drink alcohol if your health care provider tells you not to drink.  If you drink alcohol: ? Limit how much you have to 0-1 drink a day. ? Be aware of how much alcohol is in your drink. In the [place] one drink equals one 12 oz bottle of beer (355 mL), one 5 oz glass of wine (148 mL), or one 1 oz glass of hard liquor (44 mL). Lifestyle  Take daily care of your teeth and gums.  Stay active. Exercise for at least 30 minutes on 5 or more days each week.  Do not use any products that contain nicotine or tobacco, such as cigarettes, e-cigarettes, and chewing tobacco. If you need help quitting, ask your health care provider.  If you are sexually active, practice safe sex. Use a condom or other form of protection in order to prevent STIs (sexually transmitted infections).  Talk  with your health care provider about taking a low-dose aspirin or statin. What's next?  Go to your health care provider once a year for a well check visit.  Ask your health care provider how often you should have your eyes and teeth checked.  Stay up to date on all vaccines. This information is not intended to replace advice given to you by your health care provider. Make sure you discuss any questions you have with your health care provider. Document Revised: 08/06/2018 Document Reviewed: 08/06/2018 Elsevier Patient Education  2020 [facility]'s number is [phone]. Please call with questions or concerns about what we discussed today.

## 2019-12-27 ENCOUNTER — Telehealth: Payer: Self-pay

## 2019-12-27 ENCOUNTER — Other Ambulatory Visit: Payer: Self-pay | Admitting: Gastroenterology

## 2019-12-27 ENCOUNTER — Telehealth: Payer: Self-pay | Admitting: Gastroenterology

## 2019-12-27 ENCOUNTER — Other Ambulatory Visit: Payer: Self-pay | Admitting: Family Medicine

## 2019-12-27 DIAGNOSIS — N3000 Acute cystitis without hematuria: Secondary | ICD-10-CM

## 2019-12-27 MED ORDER — CEPHALEXIN 500 MG PO CAPS: 500.0000 mg | ORAL_CAPSULE | Freq: Three times a day (TID) | ORAL | 0 refills | Status: AC

## 2019-12-27 NOTE — Telephone Encounter (Signed)
Pt is requesting rf for omeprazole sent to [facility] in [place]. Pt recently had surgery and has not fully recovered yet. She made an appt for 6/7 at 3:20pm.

## 2019-12-27 NOTE — Telephone Encounter (Signed)
Patient has urgency, frequency and dysuria.  No fever, nausea or flank pain.  Has frequent UTIs.  Reasonable to treat based on symptoms.  She has taken keflex safely in  the past (known PCN allergy.)  Rx sent.  Will need face to face visit if worsens or fails to improve.

## 2019-12-27 NOTE — Telephone Encounter (Signed)
Patient called nurse line regarding UTI symptoms. Patient reports that she has been having increased urinary frequency, pain after urination, and right lower back pain. Patient denies fever or odor. Patient states that she has also been taking azo.   Offered to make patient appointment to be evaluated for UTI symptoms. Patient declined and requested antibiotic be sent into pharmacy for UTI.   To PCP  Please advise

## 2019-12-27 NOTE — Telephone Encounter (Signed)
Patient no showed December appt.  Sent Omeprazole #60 with 0 refills since pt made June appt with [facility].

## 2019-12-28 DIAGNOSIS — R29898 Other symptoms and signs involving the musculoskeletal system: Secondary | ICD-10-CM | POA: Diagnosis not present

## 2019-12-28 DIAGNOSIS — M25512 Pain in left shoulder: Secondary | ICD-10-CM | POA: Diagnosis not present

## 2019-12-28 DIAGNOSIS — M75102 Unspecified rotator cuff tear or rupture of left shoulder, not specified as traumatic: Secondary | ICD-10-CM | POA: Diagnosis not present

## 2019-12-30 DIAGNOSIS — M25512 Pain in left shoulder: Secondary | ICD-10-CM | POA: Diagnosis not present

## 2019-12-30 DIAGNOSIS — M75102 Unspecified rotator cuff tear or rupture of left shoulder, not specified as traumatic: Secondary | ICD-10-CM | POA: Diagnosis not present

## 2019-12-30 DIAGNOSIS — R29898 Other symptoms and signs involving the musculoskeletal system: Secondary | ICD-10-CM | POA: Diagnosis not present

## 2020-01-04 DIAGNOSIS — M25512 Pain in left shoulder: Secondary | ICD-10-CM | POA: Diagnosis not present

## 2020-01-04 DIAGNOSIS — M75102 Unspecified rotator cuff tear or rupture of left shoulder, not specified as traumatic: Secondary | ICD-10-CM | POA: Diagnosis not present

## 2020-01-04 DIAGNOSIS — R29898 Other symptoms and signs involving the musculoskeletal system: Secondary | ICD-10-CM | POA: Diagnosis not present

## 2020-01-06 DIAGNOSIS — R29898 Other symptoms and signs involving the musculoskeletal system: Secondary | ICD-10-CM | POA: Diagnosis not present

## 2020-01-06 DIAGNOSIS — M75102 Unspecified rotator cuff tear or rupture of left shoulder, not specified as traumatic: Secondary | ICD-10-CM | POA: Diagnosis not present

## 2020-01-06 DIAGNOSIS — M25512 Pain in left shoulder: Secondary | ICD-10-CM | POA: Diagnosis not present

## 2020-01-11 DIAGNOSIS — R29898 Other symptoms and signs involving the musculoskeletal system: Secondary | ICD-10-CM | POA: Diagnosis not present

## 2020-01-11 DIAGNOSIS — M25512 Pain in left shoulder: Secondary | ICD-10-CM | POA: Diagnosis not present

## 2020-01-11 DIAGNOSIS — M75102 Unspecified rotator cuff tear or rupture of left shoulder, not specified as traumatic: Secondary | ICD-10-CM | POA: Diagnosis not present

## 2020-01-13 ENCOUNTER — Other Ambulatory Visit: Payer: Self-pay

## 2020-01-13 ENCOUNTER — Encounter: Payer: Self-pay | Admitting: Family Medicine

## 2020-01-13 ENCOUNTER — Telehealth (INDEPENDENT_AMBULATORY_CARE_PROVIDER_SITE_OTHER): Payer: Medicare Other | Admitting: Family Medicine

## 2020-01-13 DIAGNOSIS — F32A Depression, unspecified: Secondary | ICD-10-CM | POA: Insufficient documentation

## 2020-01-13 DIAGNOSIS — M25512 Pain in left shoulder: Secondary | ICD-10-CM | POA: Diagnosis not present

## 2020-01-13 DIAGNOSIS — M75102 Unspecified rotator cuff tear or rupture of left shoulder, not specified as traumatic: Secondary | ICD-10-CM | POA: Diagnosis not present

## 2020-01-13 DIAGNOSIS — F339 Major depressive disorder, recurrent, unspecified: Secondary | ICD-10-CM | POA: Insufficient documentation

## 2020-01-13 DIAGNOSIS — R29898 Other symptoms and signs involving the musculoskeletal system: Secondary | ICD-10-CM | POA: Diagnosis not present

## 2020-01-13 DIAGNOSIS — F329 Major depressive disorder, single episode, unspecified: Secondary | ICD-10-CM

## 2020-01-13 MED ORDER — SERTRALINE HCL 25 MG PO TABS: 25.0000 mg | ORAL_TABLET | Freq: Every day | ORAL | 3 refills | Status: DC
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
## 2020-01-13 NOTE — Assessment & Plan Note (Signed)
Start med.  Given expectations of no immediate response. FU 3-4 weeks. Perhaps start counseling at that time.

## 2020-01-31 ENCOUNTER — Encounter: Payer: Self-pay | Admitting: Gastroenterology

## 2020-01-31 ENCOUNTER — Ambulatory Visit (INDEPENDENT_AMBULATORY_CARE_PROVIDER_SITE_OTHER): Payer: Medicare Other | Admitting: Gastroenterology

## 2020-01-31 VITALS — BP 102/56 | HR 76 | Ht 60.0 in | Wt 157.0 lb

## 2020-01-31 DIAGNOSIS — R1310 Dysphagia, unspecified: Secondary | ICD-10-CM | POA: Diagnosis not present

## 2020-01-31 DIAGNOSIS — K219 Gastro-esophageal reflux disease without esophagitis: Secondary | ICD-10-CM | POA: Diagnosis not present

## 2020-01-31 DIAGNOSIS — R194 Change in bowel habit: Secondary | ICD-10-CM

## 2020-01-31 MED ORDER — DICYCLOMINE HCL 10 MG PO CAPS
10.0000 mg | ORAL_CAPSULE | Freq: Three times a day (TID) | ORAL | 3 refills | Status: DC | PRN
Start: 2020-01-31 — End: 2021-01-29

## 2020-01-31 MED ORDER — PLENVU 140 G PO SOLR
1.0000 | Freq: Once | ORAL | 0 refills | Status: DC
Start: 2020-01-31 — End: 2020-03-22

## 2020-01-31 MED ORDER — DEXILANT 60 MG PO CPDR: 60.0000 mg | DELAYED_RELEASE_CAPSULE | Freq: Every day | ORAL | 0 refills | Status: DC

## 2020-01-31 NOTE — Patient Instructions (Addendum)
We have sent the following medications to your pharmacy for you to pick up at your convenience:bentyl  Stop taking omeprazole.   We have given you Dexilant samples to take one tablet by mouth daily 30 minutes before breakfast.   You have been scheduled for a Barium Esophogram at [facility] (1st floor of the hospital) on 03/08/20 at 10:30am. Please arrive 15 minutes prior to your appointment for registration. Make certain not to have anything to eat or drink 3 hours prior to your test. If you need to reschedule for any reason, please contact radiology at [phone] to do so. __________________________________________________________________ A barium swallow is an examination that concentrates on views of the esophagus. This tends to be a double contrast exam (barium and two liquids which, when combined, create a gas to distend the wall of the oesophagus) or single contrast (non-ionic iodine based). The study is usually tailored to your symptoms so a good history is essential. Attention is paid during the study to the form, structure and configuration of the esophagus, looking for functional disorders (such as aspiration, dysphagia, achalasia, motility and reflux) EXAMINATION You may be asked to change into a gown, depending on the type of swallow being performed. A radiologist and radiographer will perform the procedure. The radiologist will advise you of the type of contrast selected for your procedure and direct you during the exam. You will be asked to stand, sit or lie in several different positions and to hold a small amount of fluid in your mouth before being asked to swallow while the imaging is performed .In some instances you may be asked to swallow barium coated marshmallows to assess the motility of a solid food bolus. The exam can be recorded as a digital or video fluoroscopy procedure. POST PROCEDURE It will take 1-2 days for the barium to pass through your system. To facilitate  this, it is important, unless otherwise directed, to increase your fluids for the next 24-48hrs and to resume your normal diet.  This test typically takes about 30 minutes to perform. __________________________________________________________________________________  Theresa Brennan have been scheduled for a colonoscopy. Please follow written instructions given to you at your visit today.  Please pick up your prep supplies at the pharmacy within the next 1-3 days. If you use inhalers (even only as needed), please bring them with you on the day of your procedure.
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
## 2020-02-01 NOTE — Addendum Note (Signed)
Addended by: Jillene Bucks L on: 02/01/2020 08:22 AM   Modules accepted: Orders

## 2020-02-02 ENCOUNTER — Telehealth: Payer: Self-pay

## 2020-02-02 NOTE — Telephone Encounter (Signed)
Patient calls nurse line on the verge of tears reporting UTI symptoms. Patient reports dysuria, frequency, and low back pain, all starting ~20 minutes ago. Patient denies fever or chills. Patient reports she gets UTIs frequently and PCP typically treats over the phone. Patient is aware of the time and sorry for the late call, however she is hopeful PCP can send in Keflex. Patient advised PCP is in clinic, however I will do my best to make sure he is aware. UC given as an option if medication can not be sent in today. Will forward to PCP.   [facility] [place]

## 2020-02-03 ENCOUNTER — Other Ambulatory Visit: Payer: Self-pay

## 2020-02-03 ENCOUNTER — Telehealth (INDEPENDENT_AMBULATORY_CARE_PROVIDER_SITE_OTHER): Payer: Medicare Other | Admitting: Family Medicine

## 2020-02-03 DIAGNOSIS — N3000 Acute cystitis without hematuria: Secondary | ICD-10-CM

## 2020-02-03 MED ORDER — CEPHALEXIN 500 MG PO CAPS
500.0000 mg | ORAL_CAPSULE | Freq: Four times a day (QID) | ORAL | 0 refills | Status: DC
Start: 2020-02-03 — End: 2020-02-03

## 2020-02-03 MED ORDER — CEPHALEXIN 500 MG PO CAPS
500.0000 mg | ORAL_CAPSULE | Freq: Four times a day (QID) | ORAL | 0 refills | Status: AC
Start: 2020-02-03 — End: 2020-02-10

## 2020-02-03 NOTE — Assessment & Plan Note (Signed)
Patient's symptoms consistent with urinary tract infection.  After reviewing previous micro labs, appears that patient has history of Klebsiella pneumonia ESBL with multidrug resistance.  She also reports that Bactrim and Macrobid do not work for her.  Patient reports that Keflex has worked for her previously.  She has not had any reactions despite her amoxicillin allergy.  Will send 7 days of Keflex 4 times daily to the pharmacy.  Patient to let us know if she does not have any improvement of symptoms in 2 to 3 days given her history of multidrug resistance.  If this does occur, patient instructed to hold Azo in the morning and come in for a lab appointment to submit urine sample.  Patient is agreeable.  Patient also given other return precautions such as worsening pain, flank pain, fever.

## 2020-02-03 NOTE — Telephone Encounter (Signed)
Called patient.  The problem has already been managed by Dr. Selena Batten on a video visit earlier this morning.  No action needed by me.

## 2020-02-03 NOTE — Progress Notes (Signed)
[facility] Telemedicine Visit  Patient consented to have virtual visit and was identified by name and date of birth. Method of visit: Telephone  Encounter participants: Patient: Theresa Brennan - located at home  Provider: Melene Plan - located at [facility] Others (if applicable):   Chief Complaint: Dysuria   HPI:  Patient reports frequency, urgency, and dysuria. Started yesterday around 1600. Denies hematuria. Has been using Azo with relief, last dosed at 630 this morning. Patient reports lower back pain on right side.  She denies any flank pain or fevers.  She has a long history of UTIs in the past and reports that Bactrim and Macrobid do not work for her.  ROS: per HPI  Pertinent PMHx: Penicillin allergy, hypertension  Exam:  Ht 5' (1.524 m)   Wt 153 lb (69.4 kg)   BMI 29.88 kg/m   General: Sounds as if she is in pain Respiratory: No respiratory distress.  Speaking in full sentences.  Assessment/Plan:  Urinary tract infection Patient's symptoms consistent with urinary tract infection.  After reviewing previous micro labs, appears that patient has history of Klebsiella pneumonia ESBL with multidrug resistance.  She also reports that Bactrim and Macrobid do not work for her.  Patient reports that Keflex has worked for her previously.  She has not had any reactions despite her amoxicillin allergy.  Will send 7 days of Keflex 4 times daily to the pharmacy.  Patient to let us know if she does not have any improvement of symptoms in 2 to 3 days given her history of multidrug resistance.  If this does occur, patient instructed to hold Azo in the morning and come in for a lab appointment to submit urine sample.  Patient is agreeable.  Patient also given other return precautions such as worsening pain, flank pain, fever.    Time spent during visit with patient: 9 minutes  Melene Plan, M.D.  9:22 AM 02/03/2020

## 2020-02-07 ENCOUNTER — Ambulatory Visit (HOSPITAL_COMMUNITY): Admission: RE | Admit: 2020-02-07 | Payer: Medicare Other | Source: Ambulatory Visit

## 2020-02-22 ENCOUNTER — Telehealth: Payer: Self-pay

## 2020-02-22 DIAGNOSIS — N390 Urinary tract infection, site not specified: Secondary | ICD-10-CM | POA: Diagnosis not present

## 2020-02-22 DIAGNOSIS — R319 Hematuria, unspecified: Secondary | ICD-10-CM | POA: Diagnosis not present

## 2020-02-22 NOTE — Telephone Encounter (Signed)
Patient calls nurse line requesting Keflex for UTI symptoms. Patient reports she started having urgency and dysuria about 3 hours ago. Patient reports the pain is so bad she is crying. I advised patient PCP is on vacation, and per last telemedicine visit note from attending, it would be best if she was able to come in to give sample for UA and culture. Patient then began to cry and state, "I have no one to bring me, my friends are all out of town, and my husband died last year." I attempted to schedule a virtual visit for this afternoon for some relief, however we have no apts until tomorrow. Patient states, "I cant wait until tomorrow." I advised patient at this point the best I could do would be to send to [facility] to see if he would be willing treat. Will forward to [facility].   Please send to [facility] in [place] if appropriate.

## 2020-02-24 DIAGNOSIS — Z9889 Other specified postprocedural states: Secondary | ICD-10-CM | POA: Diagnosis not present

## 2020-02-24 DIAGNOSIS — M75101 Unspecified rotator cuff tear or rupture of right shoulder, not specified as traumatic: Secondary | ICD-10-CM | POA: Diagnosis not present

## 2020-02-24 DIAGNOSIS — M6281 Muscle weakness (generalized): Secondary | ICD-10-CM | POA: Diagnosis not present

## 2020-02-24 DIAGNOSIS — M25512 Pain in left shoulder: Secondary | ICD-10-CM | POA: Diagnosis not present

## 2020-02-26 NOTE — Telephone Encounter (Signed)
I have a "pocket veto" of this request.  Patient has frequent calls asking for antibiotics for UTI.  Unwilling to be seen.  I am not clear if UTIs are real, if she is developing resistance, etc.  I will likely suggest at her next visit that she be seen by urology to evaluate recurrent UTIs.

## 2020-02-29 DIAGNOSIS — Z9889 Other specified postprocedural states: Secondary | ICD-10-CM | POA: Diagnosis not present

## 2020-02-29 DIAGNOSIS — M75101 Unspecified rotator cuff tear or rupture of right shoulder, not specified as traumatic: Secondary | ICD-10-CM | POA: Diagnosis not present

## 2020-02-29 DIAGNOSIS — M6281 Muscle weakness (generalized): Secondary | ICD-10-CM | POA: Diagnosis not present

## 2020-02-29 DIAGNOSIS — M25512 Pain in left shoulder: Secondary | ICD-10-CM | POA: Diagnosis not present

## 2020-03-02 DIAGNOSIS — M6281 Muscle weakness (generalized): Secondary | ICD-10-CM | POA: Diagnosis not present

## 2020-03-02 DIAGNOSIS — M75101 Unspecified rotator cuff tear or rupture of right shoulder, not specified as traumatic: Secondary | ICD-10-CM | POA: Diagnosis not present

## 2020-03-02 DIAGNOSIS — M25512 Pain in left shoulder: Secondary | ICD-10-CM | POA: Diagnosis not present

## 2020-03-02 DIAGNOSIS — Z9889 Other specified postprocedural states: Secondary | ICD-10-CM | POA: Diagnosis not present

## 2020-03-06 ENCOUNTER — Telehealth: Payer: Self-pay | Admitting: Gastroenterology

## 2020-03-07 ENCOUNTER — Other Ambulatory Visit: Payer: Self-pay

## 2020-03-07 ENCOUNTER — Other Ambulatory Visit: Payer: Self-pay | Admitting: Family Medicine

## 2020-03-07 DIAGNOSIS — Z9889 Other specified postprocedural states: Secondary | ICD-10-CM | POA: Diagnosis not present

## 2020-03-07 DIAGNOSIS — M6281 Muscle weakness (generalized): Secondary | ICD-10-CM | POA: Diagnosis not present

## 2020-03-07 DIAGNOSIS — M75101 Unspecified rotator cuff tear or rupture of right shoulder, not specified as traumatic: Secondary | ICD-10-CM | POA: Diagnosis not present

## 2020-03-07 DIAGNOSIS — M25512 Pain in left shoulder: Secondary | ICD-10-CM | POA: Diagnosis not present

## 2020-03-07 MED ORDER — OMEPRAZOLE 40 MG PO CPDR
40.0000 mg | DELAYED_RELEASE_CAPSULE | Freq: Every day | ORAL | 5 refills | Status: DC
Start: 2020-03-07 — End: 2020-07-26

## 2020-03-07 MED ORDER — METHOCARBAMOL 500 MG PO TABS: 500.0000 mg | ORAL_TABLET | Freq: Four times a day (QID) | ORAL | 3 refills | Status: DC | PRN

## 2020-03-07 NOTE — Telephone Encounter (Signed)
Yes that's fine if she prefers omeprazole, happy to give that to her, although a bit unusual that omeprazole would help  her and not dexilant. I had recommended a barium swallow at the time of our office visit but I don't see she followed through with that.

## 2020-03-07 NOTE — Telephone Encounter (Signed)
Called and spoke to pt.  She is having a "sour and bitter" stomach and was trying to use bentyl which was not helping. I let her know that Bentyl is for abdominal  cramping and not acid reflux.  She said she needs something for the acid reflux which has been "burning really bad". She tried the Dexilant samples but it didn't help.  She tried 2 omeprazole 20 mg and it is helping.  She would like a refill sent to her pharmacy.  OK to send omeprazole 40 mg once daily in the am? #90 with 1 refill?

## 2020-03-07 NOTE — Telephone Encounter (Signed)
Script sent for omeprazole 40 mg once daily.  Pt was asked about scheduling a barium swallow which had been recommended at her last OV.  She indicated she thought she was scheduled for this Thursday 03-09-20 at 10:30. (She is not).  She Was scheduled on  6-14 at 10:30am and did not show so there was obviously  a communication error.  Pt has been rescheduled for this Thursday, 7-15 at 9:30am to arrive at [facility] at 9:15am. NPO 3 hours.  Patient confirmed new appt time.

## 2020-03-07 NOTE — Telephone Encounter (Signed)
Great, thanks Jan 

## 2020-03-09 ENCOUNTER — Telehealth: Payer: Self-pay | Admitting: Gastroenterology

## 2020-03-09 ENCOUNTER — Ambulatory Visit (HOSPITAL_COMMUNITY): Admission: RE | Admit: 2020-03-09 | Payer: Medicare Other | Source: Ambulatory Visit

## 2020-03-09 NOTE — Telephone Encounter (Signed)
Noted  

## 2020-03-09 NOTE — Telephone Encounter (Signed)
Pt left message with answering service cancelling barium swallow today due to having migraine. She will cb to r/s.

## 2020-03-13 DIAGNOSIS — M5416 Radiculopathy, lumbar region: Secondary | ICD-10-CM | POA: Diagnosis not present

## 2020-03-13 DIAGNOSIS — M4726 Other spondylosis with radiculopathy, lumbar region: Secondary | ICD-10-CM | POA: Diagnosis not present

## 2020-03-13 DIAGNOSIS — M25559 Pain in unspecified hip: Secondary | ICD-10-CM | POA: Diagnosis not present

## 2020-03-14 DIAGNOSIS — M75101 Unspecified rotator cuff tear or rupture of right shoulder, not specified as traumatic: Secondary | ICD-10-CM | POA: Diagnosis not present

## 2020-03-14 DIAGNOSIS — Z9889 Other specified postprocedural states: Secondary | ICD-10-CM | POA: Diagnosis not present

## 2020-03-14 DIAGNOSIS — M25512 Pain in left shoulder: Secondary | ICD-10-CM | POA: Diagnosis not present

## 2020-03-14 DIAGNOSIS — M6281 Muscle weakness (generalized): Secondary | ICD-10-CM | POA: Diagnosis not present

## 2020-03-15 DIAGNOSIS — M6281 Muscle weakness (generalized): Secondary | ICD-10-CM | POA: Diagnosis not present

## 2020-03-15 DIAGNOSIS — Z9889 Other specified postprocedural states: Secondary | ICD-10-CM | POA: Diagnosis not present

## 2020-03-15 DIAGNOSIS — M75101 Unspecified rotator cuff tear or rupture of right shoulder, not specified as traumatic: Secondary | ICD-10-CM | POA: Diagnosis not present

## 2020-03-15 DIAGNOSIS — M25512 Pain in left shoulder: Secondary | ICD-10-CM | POA: Diagnosis not present

## 2020-03-21 DIAGNOSIS — M25512 Pain in left shoulder: Secondary | ICD-10-CM | POA: Diagnosis not present

## 2020-03-21 DIAGNOSIS — M75101 Unspecified rotator cuff tear or rupture of right shoulder, not specified as traumatic: Secondary | ICD-10-CM | POA: Diagnosis not present

## 2020-03-21 DIAGNOSIS — M6281 Muscle weakness (generalized): Secondary | ICD-10-CM | POA: Diagnosis not present

## 2020-03-21 DIAGNOSIS — Z9889 Other specified postprocedural states: Secondary | ICD-10-CM | POA: Diagnosis not present

## 2020-03-22 ENCOUNTER — Other Ambulatory Visit: Payer: Self-pay

## 2020-03-22 ENCOUNTER — Encounter: Payer: Self-pay | Admitting: Gastroenterology

## 2020-03-22 ENCOUNTER — Telehealth: Payer: Self-pay | Admitting: Gastroenterology

## 2020-03-22 MED ORDER — PLENVU 140 G PO SOLR: 1.0000 | Freq: Once | ORAL | 0 refills | Status: DC

## 2020-03-22 NOTE — Progress Notes (Signed)
Called and spoke to pharmacy and patient.  The pharmacy indicated they did not get the script we sent on 6-17. They would have to order and patient needs today.  I am leaving a PLENVU sample at the front desk for pt to pick up today. She confirms she has the instructions from her visit and knows to start prepping today.

## 2020-03-22 NOTE — Telephone Encounter (Signed)
Patient came to the office at 4:00pm to pick up sample prep for tomorrow's procedure.  When she arrived she indicated that she can't drink prep - it makes her sick and makes her throw up. We discussed that todays preps are better tasting and less volume.  She could have tried the pills, Sutab but she has to start prepping tonight at 6:00pm and we would not be able to send a prescription and her get it in time since pharmacies don't carry Sutab.  I found a sample of Sutab and re-typed instructions and re-instructed patient for tomorrow's procedure.

## 2020-03-22 NOTE — Telephone Encounter (Signed)
Called and spoke to pharmacy and to patient.  The pharmacy indicated they did not get the script we sent on 6-17. They would have to order and patient needs today.  I am leaving a PLENVU sample at the front desk for pt to pick up today. She confirms she has the instructions from her visit and knows to start prepping today

## 2020-03-23 ENCOUNTER — Encounter: Payer: Self-pay | Admitting: Gastroenterology

## 2020-03-23 ENCOUNTER — Ambulatory Visit (AMBULATORY_SURGERY_CENTER): Payer: Medicare Other | Admitting: Gastroenterology

## 2020-03-23 ENCOUNTER — Other Ambulatory Visit: Payer: Self-pay

## 2020-03-23 VITALS — BP 142/61 | HR 79 | Temp 97.0°F | Resp 13 | Ht 60.0 in | Wt 157.0 lb

## 2020-03-23 DIAGNOSIS — R194 Change in bowel habit: Secondary | ICD-10-CM

## 2020-03-23 DIAGNOSIS — K649 Unspecified hemorrhoids: Secondary | ICD-10-CM

## 2020-03-23 MED ORDER — SODIUM CHLORIDE 0.9 % IV SOLN: 500.0000 mL | Freq: Once | INTRAVENOUS | Status: DC

## 2020-03-23 NOTE — Patient Instructions (Signed)
HANDOUTS PROVIDED ON: Hemorrhoids  Biopsies taken today have been sent for pathology.  The results can take 1-3 weeks to receive.  When your next colonoscopy should occur will be based on the pathology results.    You may resume your previous diet and medication schedule.  Thank you for allowing [place] to care for you today!!!      YOU HAD AN ENDOSCOPIC PROCEDURE TODAY AT THE [facility]:   Refer to the procedure report that was given to you for any specific questions about what was found during the examination.  If the procedure report does not answer your questions, please call your gastroenterologist to clarify.  If you requested that your care partner not be given the details of your procedure findings, then the procedure report has been included in a sealed envelope for you to review at your convenience later.  YOU SHOULD EXPECT: Some feelings of bloating in the abdomen. Passage of more gas than usual.  Walking can help get rid of the air that was put into your GI tract during the procedure and reduce the bloating. If you had a lower endoscopy (such as a colonoscopy or flexible sigmoidoscopy) you may notice spotting of blood in your stool or on the toilet paper. If you underwent a bowel prep for your procedure, you may not have a normal bowel movement for a few days.  Please Note:  You might notice some irritation and congestion in your nose or some drainage.  This is from the oxygen used during your procedure.  There is no need for concern and it should clear up in a day or so.  SYMPTOMS TO REPORT IMMEDIATELY:   Following lower endoscopy (colonoscopy or flexible sigmoidoscopy):  Excessive amounts of blood in the stool  Significant tenderness or worsening of abdominal pains  Swelling of the abdomen that is new, acute  Fever of 100F or higher    For urgent or emergent issues, a gastroenterologist can be reached at any hour by calling [phone]. Do not use MyChart  messaging for urgent concerns.    DIET:  We do recommend a small meal at first, but then you may proceed to your regular diet.  Drink plenty of fluids but you should avoid alcoholic beverages for 24 hours.  ACTIVITY:  You should plan to take it easy for the rest of today and you should NOT DRIVE or use heavy machinery until tomorrow (because of the sedation medicines used during the test).    FOLLOW UP: Our staff will call the number listed on your records 48-72 hours following your procedure to check on you and address any questions or concerns that you may have regarding the information given to you following your procedure. If we do not reach you, we will leave a message.  We will attempt to reach you two times.  During this call, we will ask if you have developed any symptoms of COVID 19. If you develop any symptoms (ie: fever, flu-like symptoms, shortness of breath, cough etc.) before then, please call [phone].  If you test positive for Covid 19 in the 2 weeks post procedure, please call and report this information to [place].    If any biopsies were taken you will be contacted by phone or by letter within the next 1-3 weeks.  Please call us at [phone] if you have not heard about the biopsies in 3 weeks.    SIGNATURES/CONFIDENTIALITY: You and/or your care partner have signed paperwork which  will be entered into your electronic medical record.  These signatures attest to the fact that that the information above on your After Visit Summary has been reviewed and is understood.  Full responsibility of the confidentiality of this discharge information lies with you and/or your care-partner. °

## 2020-03-23 NOTE — Progress Notes (Signed)
To PACU, VSS. Report to [email or web address] 

## 2020-03-23 NOTE — Progress Notes (Signed)
VS- Theresa Brennan 

## 2020-03-23 NOTE — Op Note (Signed)
[facility] Patient Name: Theresa Brennan Procedure Date: 03/23/2020 10:43 AM MRN: [id number] Endoscopist: Viviann Spare P. Adela Lank , MD Age: 74 Referring MD:  Date of Birth: 08/05/1946 Gender: Female Account #: [id number] Procedure:                Colonoscopy Indications:              Change in bowel habits - fecal leakage /                            incontinence, loose stools Medicines:                Monitored Anesthesia Care Procedure:                Pre-Anesthesia Assessment:                           - Prior to the procedure, a History and Physical                            was performed, and patient medications and                            allergies were reviewed. The patient's tolerance of                            previous anesthesia was also reviewed. The risks                            and benefits of the procedure and the sedation                            options and risks were discussed with the patient.                            All questions were answered, and informed consent                            was obtained. Prior Anticoagulants: The patient has                            taken no previous anticoagulant or antiplatelet                            agents. ASA Grade Assessment: III - A patient with                            severe systemic disease. After reviewing the risks                            and benefits, the patient was deemed in                            satisfactory condition to undergo the procedure.  After obtaining informed consent, the colonoscope                            was passed under direct vision. Throughout the                            procedure, the patient's blood pressure, pulse, and                            oxygen saturations were monitored continuously. The                            Colonoscope was introduced through the anus and                            advanced to the the terminal ileum,  with                            identification of the appendiceal orifice and IC                            valve. The colonoscopy was performed without                            difficulty. The patient tolerated the procedure                            well. The quality of the bowel preparation was                            good. The terminal ileum, ileocecal valve,                            appendiceal orifice, and rectum were photographed. Scope In: 10:59:54 AM Scope Out: 11:12:53 AM Scope Withdrawal Time: 0 hours 10 minutes 56 seconds  Total Procedure Duration: 0 hours 12 minutes 59 seconds  Findings:                 The perianal and digital rectal examinations were                            normal.                           The terminal ileum appeared normal.                           Internal hemorrhoids were found during retroflexion.                           The exam was otherwise without abnormality. No                            overt inflammatory changes, no polyps.  Biopsies for histology were taken with a cold                            forceps from the right colon, left colon and                            transverse colon for evaluation of microscopic                            colitis. Complications:            No immediate complications. Estimated blood loss:                            Minimal. Estimated Blood Loss:     Estimated blood loss was minimal. Impression:               - The examined portion of the ileum was normal.                           - Internal hemorrhoids.                           - The examination was otherwise normal.                           - Biopsies were taken with a cold forceps from the                            right colon, left colon and transverse colon for                            evaluation of microscopic colitis. Recommendation:           - Patient has a contact number available for                             emergencies. The signs and symptoms of potential                            delayed complications were discussed with the                            patient. Return to normal activities tomorrow.                            Written discharge instructions were provided to the                            patient.                           - Resume previous diet.                           - Continue present medications.                           -  Await pathology results with further                            recommendations. Viviann Spare P. Mckinleigh Schuchart, MD 03/23/2020 11:17:32 AM This report has been signed electronically.

## 2020-03-23 NOTE — Progress Notes (Signed)
Called to room to assist during endoscopic procedure.  Patient ID and intended procedure confirmed with present staff. Received instructions for my participation in the procedure from the performing physician.  

## 2020-03-27 ENCOUNTER — Telehealth: Payer: Self-pay | Admitting: *Deleted

## 2020-03-27 ENCOUNTER — Other Ambulatory Visit: Payer: Self-pay

## 2020-03-27 MED ORDER — TRAMADOL HCL 50 MG PO TABS: ORAL_TABLET | ORAL | 5 refills | Status: DC

## 2020-03-27 NOTE — Telephone Encounter (Signed)
°  Follow up Call-  Call back number 03/23/2020 10/07/2017  Post procedure Call Back phone  # [phone]  Permission to leave phone message Yes Yes  Some recent data might be hidden     Patient questions:  Do you have a fever, pain , or abdominal swelling? No. Pain Score  0 *  Have you tolerated food without any problems? Yes.    Have you been able to return to your normal activities? Yes.    Do you have any questions about your discharge instructions: Diet   No. Medications  No. Follow up visit  No.  Do you have questions or concerns about your Care? No.  Actions: * If pain score is 4 or above: No action needed, pain <4.  1. Have you developed a fever since your procedure? no  2.   Have you had an respiratory symptoms (SOB or cough) since your procedure? no  3.   Have you tested positive for COVID 19 since your procedure no  4.   Have you had any family members/close contacts diagnosed with the COVID 19 since your procedure?  no   If yes to any of these questions please route to Laverna Peace, RN and Charlett Lango, RN

## 2020-03-28 DIAGNOSIS — M75102 Unspecified rotator cuff tear or rupture of left shoulder, not specified as traumatic: Secondary | ICD-10-CM | POA: Diagnosis not present

## 2020-03-28 DIAGNOSIS — M25512 Pain in left shoulder: Secondary | ICD-10-CM | POA: Diagnosis not present

## 2020-03-28 DIAGNOSIS — M6281 Muscle weakness (generalized): Secondary | ICD-10-CM | POA: Diagnosis not present

## 2020-03-30 DIAGNOSIS — M75102 Unspecified rotator cuff tear or rupture of left shoulder, not specified as traumatic: Secondary | ICD-10-CM | POA: Diagnosis not present

## 2020-03-30 DIAGNOSIS — M6281 Muscle weakness (generalized): Secondary | ICD-10-CM | POA: Diagnosis not present

## 2020-03-30 DIAGNOSIS — M25512 Pain in left shoulder: Secondary | ICD-10-CM | POA: Diagnosis not present

## 2020-04-04 DIAGNOSIS — M75102 Unspecified rotator cuff tear or rupture of left shoulder, not specified as traumatic: Secondary | ICD-10-CM | POA: Diagnosis not present

## 2020-04-04 DIAGNOSIS — M25512 Pain in left shoulder: Secondary | ICD-10-CM | POA: Diagnosis not present

## 2020-04-04 DIAGNOSIS — M6281 Muscle weakness (generalized): Secondary | ICD-10-CM | POA: Diagnosis not present

## 2020-04-05 DIAGNOSIS — M25559 Pain in unspecified hip: Secondary | ICD-10-CM | POA: Diagnosis not present

## 2020-04-05 DIAGNOSIS — M47817 Spondylosis without myelopathy or radiculopathy, lumbosacral region: Secondary | ICD-10-CM | POA: Diagnosis not present

## 2020-04-05 DIAGNOSIS — S3993XA Unspecified injury of pelvis, initial encounter: Secondary | ICD-10-CM | POA: Diagnosis not present

## 2020-04-05 DIAGNOSIS — M7061 Trochanteric bursitis, right hip: Secondary | ICD-10-CM | POA: Diagnosis not present

## 2020-04-05 DIAGNOSIS — M5416 Radiculopathy, lumbar region: Secondary | ICD-10-CM | POA: Diagnosis not present

## 2020-04-05 DIAGNOSIS — M160 Bilateral primary osteoarthritis of hip: Secondary | ICD-10-CM | POA: Diagnosis not present

## 2020-04-06 DIAGNOSIS — M75102 Unspecified rotator cuff tear or rupture of left shoulder, not specified as traumatic: Secondary | ICD-10-CM | POA: Diagnosis not present

## 2020-04-06 DIAGNOSIS — M6281 Muscle weakness (generalized): Secondary | ICD-10-CM | POA: Diagnosis not present

## 2020-04-06 DIAGNOSIS — M25512 Pain in left shoulder: Secondary | ICD-10-CM | POA: Diagnosis not present

## 2020-04-10 DIAGNOSIS — E7800 Pure hypercholesterolemia, unspecified: Secondary | ICD-10-CM | POA: Diagnosis not present

## 2020-04-10 DIAGNOSIS — I499 Cardiac arrhythmia, unspecified: Secondary | ICD-10-CM | POA: Diagnosis not present

## 2020-04-10 DIAGNOSIS — Z8249 Family history of ischemic heart disease and other diseases of the circulatory system: Secondary | ICD-10-CM | POA: Diagnosis not present

## 2020-04-26 ENCOUNTER — Telehealth: Payer: Self-pay

## 2020-04-26 DIAGNOSIS — J329 Chronic sinusitis, unspecified: Secondary | ICD-10-CM

## 2020-04-26 NOTE — Telephone Encounter (Signed)
Patient calls nurse line reporting a sinus infection. Patient reports facial pain 5/10 with pressure around her eyes. Patient reports she can hardly stand to touch her face. Patient reports symptoms began on Saturday and have persisted. Patient denies fever/chills, SOB, or cough. Patient would prefer not to come in due to delta variant. Patient has been vaccinated. Patient is requesting an antibiotic to her local [facility]. Please advise.

## 2020-04-27 MED ORDER — AZITHROMYCIN 250 MG PO TABS: ORAL_TABLET | ORAL | 0 refills | Status: DC

## 2020-04-27 NOTE — Assessment & Plan Note (Signed)
Allergic to penicillins.  Will prescribe Zpac.  Also instructed to resume flonase

## 2020-04-27 NOTE — Telephone Encounter (Signed)
Called.  Facial pain, pressure and nasal congestion.  Typical of her sinusitis.  No cough or SOB.  Has been COVID vaccinated.  Allergic to PCN.  Rx z pac.  Be seen if not improved.

## 2020-05-05 ENCOUNTER — Other Ambulatory Visit: Payer: Self-pay | Admitting: Family Medicine

## 2020-05-05 DIAGNOSIS — F32A Depression, unspecified: Secondary | ICD-10-CM

## 2020-05-18 DIAGNOSIS — M5136 Other intervertebral disc degeneration, lumbar region: Secondary | ICD-10-CM | POA: Diagnosis not present

## 2020-05-18 DIAGNOSIS — M1612 Unilateral primary osteoarthritis, left hip: Secondary | ICD-10-CM | POA: Insufficient documentation

## 2020-05-18 DIAGNOSIS — M533 Sacrococcygeal disorders, not elsewhere classified: Secondary | ICD-10-CM | POA: Insufficient documentation

## 2020-05-18 DIAGNOSIS — M4726 Other spondylosis with radiculopathy, lumbar region: Secondary | ICD-10-CM | POA: Diagnosis not present

## 2020-05-24 ENCOUNTER — Ambulatory Visit: Payer: Medicare Other | Admitting: Family Medicine

## 2020-05-25 DIAGNOSIS — M75102 Unspecified rotator cuff tear or rupture of left shoulder, not specified as traumatic: Secondary | ICD-10-CM | POA: Diagnosis not present

## 2020-05-25 DIAGNOSIS — M75122 Complete rotator cuff tear or rupture of left shoulder, not specified as traumatic: Secondary | ICD-10-CM | POA: Diagnosis not present

## 2020-05-25 DIAGNOSIS — N3010 Interstitial cystitis (chronic) without hematuria: Secondary | ICD-10-CM | POA: Diagnosis not present

## 2020-05-25 DIAGNOSIS — R399 Unspecified symptoms and signs involving the genitourinary system: Secondary | ICD-10-CM | POA: Diagnosis not present

## 2020-05-25 DIAGNOSIS — N3000 Acute cystitis without hematuria: Secondary | ICD-10-CM | POA: Diagnosis not present

## 2020-05-25 DIAGNOSIS — Z4789 Encounter for other orthopedic aftercare: Secondary | ICD-10-CM | POA: Diagnosis not present

## 2020-06-01 ENCOUNTER — Other Ambulatory Visit: Payer: Self-pay | Admitting: Family Medicine

## 2020-06-07 DIAGNOSIS — M1612 Unilateral primary osteoarthritis, left hip: Secondary | ICD-10-CM | POA: Diagnosis not present

## 2020-06-14 ENCOUNTER — Other Ambulatory Visit: Payer: Self-pay | Admitting: Family Medicine

## 2020-06-14 DIAGNOSIS — F32A Depression, unspecified: Secondary | ICD-10-CM

## 2020-06-26 DIAGNOSIS — M5126 Other intervertebral disc displacement, lumbar region: Secondary | ICD-10-CM | POA: Diagnosis not present

## 2020-06-26 DIAGNOSIS — M5136 Other intervertebral disc degeneration, lumbar region: Secondary | ICD-10-CM | POA: Diagnosis not present

## 2020-06-26 DIAGNOSIS — M545 Low back pain, unspecified: Secondary | ICD-10-CM | POA: Diagnosis not present

## 2020-06-26 DIAGNOSIS — M5416 Radiculopathy, lumbar region: Secondary | ICD-10-CM | POA: Diagnosis not present

## 2020-07-07 DIAGNOSIS — M5416 Radiculopathy, lumbar region: Secondary | ICD-10-CM | POA: Diagnosis not present

## 2020-07-07 DIAGNOSIS — M5136 Other intervertebral disc degeneration, lumbar region: Secondary | ICD-10-CM | POA: Diagnosis not present

## 2020-07-07 DIAGNOSIS — M1612 Unilateral primary osteoarthritis, left hip: Secondary | ICD-10-CM | POA: Diagnosis not present

## 2020-07-07 DIAGNOSIS — M47816 Spondylosis without myelopathy or radiculopathy, lumbar region: Secondary | ICD-10-CM | POA: Diagnosis not present

## 2020-07-13 ENCOUNTER — Other Ambulatory Visit: Payer: Self-pay | Admitting: Family Medicine

## 2020-07-18 ENCOUNTER — Ambulatory Visit (INDEPENDENT_AMBULATORY_CARE_PROVIDER_SITE_OTHER): Payer: Medicare Other | Admitting: Family Medicine

## 2020-07-18 ENCOUNTER — Other Ambulatory Visit: Payer: Self-pay

## 2020-07-18 VITALS — BP 122/74 | HR 88 | Temp 98.2°F | Wt 161.0 lb

## 2020-07-18 DIAGNOSIS — J0191 Acute recurrent sinusitis, unspecified: Secondary | ICD-10-CM

## 2020-07-18 DIAGNOSIS — J329 Chronic sinusitis, unspecified: Secondary | ICD-10-CM | POA: Diagnosis not present

## 2020-07-18 DIAGNOSIS — F32A Depression, unspecified: Secondary | ICD-10-CM | POA: Diagnosis not present

## 2020-07-18 MED ORDER — SERTRALINE HCL 50 MG PO TABS: 50.0000 mg | ORAL_TABLET | Freq: Every day | ORAL | 3 refills | Status: DC

## 2020-07-18 MED ORDER — DOXYCYCLINE HYCLATE 100 MG PO TABS: 100.0000 mg | ORAL_TABLET | Freq: Two times a day (BID) | ORAL | 0 refills | Status: DC

## 2020-07-18 MED ORDER — IPRATROPIUM BROMIDE 0.03 % NA SOLN: 2.0000 | Freq: Two times a day (BID) | NASAL | 12 refills | Status: DC

## 2020-07-18 NOTE — Progress Notes (Signed)
    SUBJECTIVE:   CHIEF COMPLAINT / HPI: sinus infection  74 yo woman with h/o recurrent sinus infections returns today with 3 days of runny nose, nasal congestion, mild frontal HA, and cough in the morning. She denies fevers, CP, SOB, n/v/d, rashes. She reports that her symptoms are consistent with prior sinus infections.   MDD: elevated phq-9, patient states that she has been on sertraline 25 mg for several months with only minimal improvement. Her husband passed away about 18 months ago and she was having continued low mood in May 2021, which is when she was started on sertraline. Patient was in her first relationship since her husband died, but recently ended it. She is heartbroken and tearful. Answer to question 9 is 0.  PERTINENT  PMH / PSH: recurrent sinus infections, MDD   OBJECTIVE:  Nursing note and vitals reviewed BP 122/74   Pulse 88   Temp 98.2 F (36.8 C)   Wt 161 lb (73 kg)   SpO2 97%   BMI 31.44 kg/m   HEENT: PERRL. Sclera without injection or icterus. MMM. Nasal sinus erythematous and edematous, clear secretions present. External auditory canal examined and WNL. TM normal appearance, no erythema or bulging. Neck: Supple.  Cardiac: Regular rate and rhythm. Normal S1/S2. No murmurs, rubs, or gallops appreciated. Lungs: Clear bilaterally to ascultation.  Neuro: AOx3, at baseline Psych: Pleasant and appropriate, tearful when discussing her recent breakup  ASSESSMENT/PLAN:   Sinusitis, chronic Patient with recurrent sinusitis, allergic to PCNs. Will treat with doxycycline x 5 days. Recommend ipratropium nasal spray for rhinorrhea.  Depression Recent trigger of break up, still combined with grief. Increased sertraline to 50 mg. Recommend follow up in 2-3 weeks with PCP. Consider counseling.     Shirlean Mylar, MD [facility]

## 2020-07-18 NOTE — Patient Instructions (Addendum)
It was a pleasure to see you today!  1. For your cold symptoms: you can use doxycycline 100 mg twice a day for 5 days. I would recommend doing conservative care measures such as tylenol, warm fluids (tea, soup) first as well.   2. For your runny nose, use ipratropium nasal spray, 2 sprays in each nostril every 12 hours as needed.  3. Increase your zoloft to 50 mg daily. The improvement won't come immediately, please allow a total of 3-4 weeks. I recommend you follow up with Dr. Leveda Anna in 2 weeks to check in.   Be Well,  Dr. Leary Roca

## 2020-07-23 NOTE — Assessment & Plan Note (Signed)
Recent trigger of break up, still combined with grief. Increased sertraline to 50 mg. Recommend follow up in 2-3 weeks with PCP. Consider counseling.

## 2020-07-23 NOTE — Assessment & Plan Note (Signed)
Patient with recurrent sinusitis, allergic to PCNs. Will treat with doxycycline x 5 days. Recommend ipratropium nasal spray for rhinorrhea.

## 2020-07-26 ENCOUNTER — Other Ambulatory Visit: Payer: Self-pay | Admitting: Gastroenterology

## 2020-07-26 ENCOUNTER — Other Ambulatory Visit: Payer: Self-pay | Admitting: Family Medicine

## 2020-07-26 DIAGNOSIS — M5126 Other intervertebral disc displacement, lumbar region: Secondary | ICD-10-CM | POA: Diagnosis not present

## 2020-07-26 DIAGNOSIS — Z79899 Other long term (current) drug therapy: Secondary | ICD-10-CM | POA: Diagnosis not present

## 2020-07-26 DIAGNOSIS — I10 Essential (primary) hypertension: Secondary | ICD-10-CM | POA: Diagnosis not present

## 2020-07-26 DIAGNOSIS — M4726 Other spondylosis with radiculopathy, lumbar region: Secondary | ICD-10-CM | POA: Diagnosis not present

## 2020-07-26 DIAGNOSIS — I5032 Chronic diastolic (congestive) heart failure: Secondary | ICD-10-CM

## 2020-07-26 DIAGNOSIS — M5116 Intervertebral disc disorders with radiculopathy, lumbar region: Secondary | ICD-10-CM | POA: Diagnosis not present

## 2020-07-26 DIAGNOSIS — Z79891 Long term (current) use of opiate analgesic: Secondary | ICD-10-CM | POA: Diagnosis not present

## 2020-07-26 DIAGNOSIS — M5127 Other intervertebral disc displacement, lumbosacral region: Secondary | ICD-10-CM | POA: Diagnosis not present

## 2020-07-31 ENCOUNTER — Telehealth: Payer: Self-pay

## 2020-07-31 DIAGNOSIS — J329 Chronic sinusitis, unspecified: Secondary | ICD-10-CM

## 2020-07-31 MED ORDER — CEFIXIME 400 MG PO CAPS: 400.0000 mg | ORAL_CAPSULE | Freq: Every day | ORAL | 0 refills | Status: DC

## 2020-07-31 MED ORDER — NEOMYCIN-POLYMYXIN-HC 3.5-10000-1 OT SOLN: 3.0000 [drp] | Freq: Four times a day (QID) | OTIC | 0 refills | Status: DC

## 2020-07-31 NOTE — Telephone Encounter (Signed)
Patient with hx of requesting antibiotics for various problems.  Seen on 11/23 and given antibiotics   Objectively, she has had the same sinus infection for 2 weeks.  Did not respond to doxy.  Given duration of illness, I will treat and I informed her that she needs an appointment with me.

## 2020-07-31 NOTE — Telephone Encounter (Signed)
Patient returns call to nurse line regarding continued sick symptoms. Patient reports continued facial, nasal and ear pain. Patient denies fever. Patient does report intermittent dizziness that is worse in the AM and when going to bed. Denies chest pain or difficulty breathing. Also reports productive cough with clear to yellow mucus. (More dark yellow in the morning and clears throughout the day). Patient reports that she has been drinking lots of fluids and broths.   Patient completed full course of abx therapy per Dr. Leary Roca and is requesting a different abx for treatment. Advised patient that she may need to be reevaluated, however, prefers to wait for provider's instructions.   Strict ED precautions given in the meantime.   To PCP  Veronda Prude, RN

## 2020-08-09 ENCOUNTER — Other Ambulatory Visit: Payer: Self-pay | Admitting: Family Medicine

## 2020-09-07 DIAGNOSIS — M5136 Other intervertebral disc degeneration, lumbar region: Secondary | ICD-10-CM | POA: Diagnosis not present

## 2020-09-07 DIAGNOSIS — M47816 Spondylosis without myelopathy or radiculopathy, lumbar region: Secondary | ICD-10-CM | POA: Diagnosis not present

## 2020-09-07 DIAGNOSIS — M5416 Radiculopathy, lumbar region: Secondary | ICD-10-CM | POA: Diagnosis not present

## 2020-09-07 DIAGNOSIS — M1612 Unilateral primary osteoarthritis, left hip: Secondary | ICD-10-CM | POA: Diagnosis not present

## 2020-09-19 DIAGNOSIS — M25552 Pain in left hip: Secondary | ICD-10-CM | POA: Diagnosis not present

## 2020-09-19 DIAGNOSIS — M1612 Unilateral primary osteoarthritis, left hip: Secondary | ICD-10-CM | POA: Diagnosis not present

## 2020-09-19 DIAGNOSIS — M5416 Radiculopathy, lumbar region: Secondary | ICD-10-CM | POA: Diagnosis not present

## 2020-09-26 ENCOUNTER — Other Ambulatory Visit: Payer: Self-pay | Admitting: Family Medicine

## 2020-10-18 NOTE — Progress Notes (Deleted)
   Subjective:   Patient ID: Theresa Brennan    DOB: 12/25/1945, 75 y.o. female   MRN: [id number]  Theresa Brennan is a 75 y.o. female with a history of carotid artery stenosis, chronic diastolic heart failure, HTN, migraines w/out aura, allergic rhinitis, chronic sinusitis, GERD, IBS, subclinical hypothyroidism, DJD of hips, herniated nucleus pulposus of lumbar spine, osteoarthritis, osteopenia, interstitial cystitis, atrophic vaginitis, chronic pain, dyspareunia, depression, estrogen deficiency, HLD, hypoalbuminemia, lightheadedness, LE pain, memory loss, menopause, RLS, possible somatization disorder  here for "sinus infection" and spot on nose.  "Chronic Sinus Infection:  Last treated for sinus infection in November 2021 with Doxycycline x 5 days.  Spot on Nose:  Review of Systems:  Per HPI.   Objective:   There were no vitals taken for this visit. Vitals and nursing note reviewed.  General: pleasant ***, sitting comfortably in exam chair, well nourished, well developed, in no acute distress with non-toxic appearance HEENT: normocephalic, atraumatic, moist mucous membranes, oropharynx clear without erythema or exudate, TM normal bilaterally  Neck: supple, non-tender without lymphadenopathy CV: regular rate and rhythm without murmurs, rubs, or gallops, no lower extremity edema, 2+ radial and pedal pulses bilaterally Lungs: clear to auscultation bilaterally with normal work of breathing on room air Resp: breathing comfortably on room air, speaking in full sentences Abdomen: soft, non-tender, non-distended, no masses or organomegaly palpable, normoactive bowel sounds Skin: warm, dry, no rashes or lesions Extremities: warm and well perfused, normal tone MSK: ROM grossly intact, strength intact, gait normal Neuro: Alert and oriented, speech normal  Assessment & Plan:   No problem-specific Assessment & Plan notes found for this encounter.  No orders of the defined types were placed in this  encounter.  No orders of the defined types were placed in this encounter.  CT sinus ??  Orpah Cobb, DO PGY-3, [facility] 10/18/2020 1:36 PM

## 2020-10-19 ENCOUNTER — Ambulatory Visit: Payer: Medicare Other

## 2020-10-19 DIAGNOSIS — R399 Unspecified symptoms and signs involving the genitourinary system: Secondary | ICD-10-CM | POA: Diagnosis not present

## 2020-10-20 ENCOUNTER — Other Ambulatory Visit: Payer: Self-pay | Admitting: Family Medicine

## 2020-10-25 ENCOUNTER — Ambulatory Visit: Payer: Medicare Other | Admitting: Family Medicine

## 2020-10-30 ENCOUNTER — Ambulatory Visit: Payer: Medicare Other | Admitting: Family Medicine

## 2020-11-17 ENCOUNTER — Other Ambulatory Visit: Payer: Self-pay | Admitting: Family Medicine

## 2020-11-17 DIAGNOSIS — I5032 Chronic diastolic (congestive) heart failure: Secondary | ICD-10-CM

## 2020-11-21 DIAGNOSIS — M533 Sacrococcygeal disorders, not elsewhere classified: Secondary | ICD-10-CM | POA: Diagnosis not present

## 2020-11-21 DIAGNOSIS — S0990XA Unspecified injury of head, initial encounter: Secondary | ICD-10-CM | POA: Diagnosis not present

## 2020-11-21 DIAGNOSIS — J3489 Other specified disorders of nose and nasal sinuses: Secondary | ICD-10-CM | POA: Diagnosis not present

## 2020-11-21 DIAGNOSIS — Z20822 Contact with and (suspected) exposure to covid-19: Secondary | ICD-10-CM | POA: Diagnosis not present

## 2020-11-21 DIAGNOSIS — W182XXA Fall in (into) shower or empty bathtub, initial encounter: Secondary | ICD-10-CM | POA: Diagnosis not present

## 2020-11-21 DIAGNOSIS — S161XXA Strain of muscle, fascia and tendon at neck level, initial encounter: Secondary | ICD-10-CM | POA: Diagnosis not present

## 2020-11-21 DIAGNOSIS — M069 Rheumatoid arthritis, unspecified: Secondary | ICD-10-CM | POA: Diagnosis not present

## 2020-11-21 DIAGNOSIS — R519 Headache, unspecified: Secondary | ICD-10-CM | POA: Diagnosis not present

## 2020-11-21 DIAGNOSIS — Z79899 Other long term (current) drug therapy: Secondary | ICD-10-CM | POA: Diagnosis not present

## 2020-11-21 DIAGNOSIS — M542 Cervicalgia: Secondary | ICD-10-CM | POA: Diagnosis not present

## 2020-11-21 DIAGNOSIS — Z87891 Personal history of nicotine dependence: Secondary | ICD-10-CM | POA: Diagnosis not present

## 2020-11-21 DIAGNOSIS — E079 Disorder of thyroid, unspecified: Secondary | ICD-10-CM | POA: Diagnosis not present

## 2020-11-21 DIAGNOSIS — Z23 Encounter for immunization: Secondary | ICD-10-CM | POA: Diagnosis not present

## 2020-11-21 DIAGNOSIS — Z719 Counseling, unspecified: Secondary | ICD-10-CM | POA: Diagnosis not present

## 2020-11-30 ENCOUNTER — Ambulatory Visit: Payer: Medicare Other

## 2020-11-30 ENCOUNTER — Other Ambulatory Visit: Payer: Self-pay | Admitting: Family Medicine

## 2020-11-30 DIAGNOSIS — E785 Hyperlipidemia, unspecified: Secondary | ICD-10-CM

## 2020-12-21 ENCOUNTER — Ambulatory Visit (INDEPENDENT_AMBULATORY_CARE_PROVIDER_SITE_OTHER): Payer: Medicare Other | Admitting: Family Medicine

## 2020-12-21 ENCOUNTER — Other Ambulatory Visit: Payer: Self-pay

## 2020-12-21 DIAGNOSIS — J320 Chronic maxillary sinusitis: Secondary | ICD-10-CM | POA: Diagnosis not present

## 2020-12-21 DIAGNOSIS — J0191 Acute recurrent sinusitis, unspecified: Secondary | ICD-10-CM | POA: Diagnosis not present

## 2020-12-21 MED ORDER — DOXYCYCLINE HYCLATE 100 MG PO TABS
100.0000 mg | ORAL_TABLET | Freq: Two times a day (BID) | ORAL | 0 refills | Status: DC
Start: 2020-12-21 — End: 2021-02-22

## 2020-12-21 NOTE — Assessment & Plan Note (Addendum)
She appears to have acutely worsened sinusitis in the setting of a mild chronic sinusitis.  We will treat with 7 days of doxycycline.  She has an allergy to Augmentin. -Continue Flonase daily -Referral placed to ENT for chronic sinusitis. -Follow-up COVID test

## 2020-12-21 NOTE — Patient Instructions (Signed)
Sinusitis: It sounds like you have quite an issue with chronic sinusitis.  You should be seen by an ear nose and throat doctor for this issue.  We will treat you for a bacterial sinusitis with 7 days of doxycycline.  I would like you to continue using Flonase daily.  You should get a call in the next 1-2 weeks about scheduling an appointment with an ENT doctor.

## 2020-12-21 NOTE — Progress Notes (Signed)
    SUBJECTIVE:   CHIEF COMPLAINT / HPI:   Sinusitis Theresa Brennan reports that she has been experiencing ongoing sinus congestion and mild pressure for several months.  She reports that she has a history of recurrent sinus infections and has previously been seen by ENT.  She has not been to an ENT doctor in several years because her ENT doctor retired.  In the setting of her chronic sinus infection, she noticed worsened symptoms starting this past Tuesday, 2 days ago.  She now reports intense sinus pressure and headache in addition to a cough.  She denies feve, shortness of breath, nausea, vomiting.  She received the initial, 2 dose series for her COVID-vaccine over 1 year ago.  PERTINENT  PMH / PSH: Chronic sinusitis, seasonal allergies, chronic bronchitis  OBJECTIVE:   BP (!) 151/86   Pulse 88   Temp 98.8 F (37.1 C)   Ht 5' (1.524 m)   SpO2 99%   BMI 31.44 kg/m    General: Alert and cooperative and appears to be in no acute distress HEENT: Tenderness over the maxillary and frontal sinuses.  Oropharynx moist and normal without evidence of significant erythema.  No tonsillar enlargement.  No cervical lymphadenopathy. Cardio: Normal S1 and S2, no S3 or S4. Rhythm is regular. No murmurs or rubs.   Pulm: Clear to auscultation bilaterally, no crackles, wheezing, or diminished breath sounds. Normal respiratory effort Abdomen: Bowel sounds normal. Abdomen soft and non-tender.  Extremities: No peripheral edema. Warm/ well perfused.  Strong radial pulses. Neuro: Cranial nerves grossly intact  ASSESSMENT/PLAN:   Sinusitis, chronic She appears to have acutely worsened sinusitis in the setting of a mild chronic sinusitis.  We will treat with 7 days of doxycycline.  She has an allergy to Augmentin. -Continue Flonase daily -Referral placed to ENT for chronic sinusitis. -Follow-up COVID test     Mirian Mo, MD [facility]

## 2020-12-23 LAB — NOVEL CORONAVIRUS, NAA: SARS-CoV-2, NAA: NOT DETECTED

## 2020-12-23 LAB — SARS-COV-2, NAA 2 DAY TAT

## 2020-12-26 ENCOUNTER — Other Ambulatory Visit: Payer: Self-pay | Admitting: Family Medicine

## 2020-12-26 DIAGNOSIS — F32A Depression, unspecified: Secondary | ICD-10-CM

## 2021-01-02 ENCOUNTER — Other Ambulatory Visit: Payer: Self-pay | Admitting: Family Medicine

## 2021-01-17 ENCOUNTER — Other Ambulatory Visit: Payer: Self-pay | Admitting: Family Medicine

## 2021-01-17 ENCOUNTER — Other Ambulatory Visit: Payer: Self-pay | Admitting: Gastroenterology

## 2021-01-24 ENCOUNTER — Other Ambulatory Visit: Payer: Self-pay | Admitting: Family Medicine

## 2021-01-24 DIAGNOSIS — N3941 Urge incontinence: Secondary | ICD-10-CM | POA: Diagnosis not present

## 2021-01-24 DIAGNOSIS — R32 Unspecified urinary incontinence: Secondary | ICD-10-CM | POA: Diagnosis not present

## 2021-01-24 DIAGNOSIS — N952 Postmenopausal atrophic vaginitis: Secondary | ICD-10-CM | POA: Diagnosis not present

## 2021-01-29 ENCOUNTER — Other Ambulatory Visit: Payer: Self-pay

## 2021-01-29 ENCOUNTER — Ambulatory Visit (INDEPENDENT_AMBULATORY_CARE_PROVIDER_SITE_OTHER): Payer: Medicare Other | Admitting: Family Medicine

## 2021-01-29 ENCOUNTER — Encounter: Payer: Self-pay | Admitting: Family Medicine

## 2021-01-29 ENCOUNTER — Ambulatory Visit (INDEPENDENT_AMBULATORY_CARE_PROVIDER_SITE_OTHER): Payer: Medicare Other | Admitting: Otolaryngology

## 2021-01-29 VITALS — Temp 97.3°F

## 2021-01-29 DIAGNOSIS — H6121 Impacted cerumen, right ear: Secondary | ICD-10-CM | POA: Diagnosis not present

## 2021-01-29 DIAGNOSIS — D649 Anemia, unspecified: Secondary | ICD-10-CM

## 2021-01-29 DIAGNOSIS — I10 Essential (primary) hypertension: Secondary | ICD-10-CM | POA: Diagnosis not present

## 2021-01-29 DIAGNOSIS — E038 Other specified hypothyroidism: Secondary | ICD-10-CM

## 2021-01-29 DIAGNOSIS — I6529 Occlusion and stenosis of unspecified carotid artery: Secondary | ICD-10-CM

## 2021-01-29 DIAGNOSIS — I5032 Chronic diastolic (congestive) heart failure: Secondary | ICD-10-CM

## 2021-01-29 DIAGNOSIS — H7291 Unspecified perforation of tympanic membrane, right ear: Secondary | ICD-10-CM

## 2021-01-29 DIAGNOSIS — F32A Depression, unspecified: Secondary | ICD-10-CM | POA: Diagnosis not present

## 2021-01-29 DIAGNOSIS — J310 Chronic rhinitis: Secondary | ICD-10-CM | POA: Diagnosis not present

## 2021-01-29 MED ORDER — METHOCARBAMOL 500 MG PO TABS: ORAL_TABLET | ORAL | 3 refills | Status: DC

## 2021-01-29 MED ORDER — SERTRALINE HCL 50 MG PO TABS
1.0000 | ORAL_TABLET | Freq: Every day | ORAL | 3 refills | Status: DC
Start: 2021-01-29 — End: 2022-05-22

## 2021-01-29 NOTE — Patient Instructions (Signed)
I want to get to the bottom of why you are retaining fluid, not just treat the symptom. First step is blood work. I will call tomorrow with the blood test results.  When I call, I will also tell you what to do with your fluid pill, furosemide.  We will also talk about any more tests I need to order and when I will see you in follow up.  I will want to see you in 1-3 weeks depending on whether I need to more tests.

## 2021-01-29 NOTE — Assessment & Plan Note (Signed)
Asymptomatic.  On ASA and rosuvastatin for secondary prevention.  At risk for ischemic cardiomyopathy.

## 2021-01-29 NOTE — Assessment & Plan Note (Signed)
A bit up today.  Clearly needs more diuresis.  No med changes until I see labs.
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
## 2021-01-29 NOTE — Assessment & Plan Note (Addendum)
Likely her fluid retention is cardiac in origin.  Will get check renal, liver and thyroid function for differential.  BNP.  If BNP up, may need repeat echo.  Called with reassuring BNP.  Asked for cardiology referral/2nd opinion.

## 2021-01-29 NOTE — Progress Notes (Signed)
    SUBJECTIVE:   CHIEF COMPLAINT / HPI:   Swelling and fatigue. First visit to me in some time.  Has been seen for urgent issues such as UTI and sinus infection.  C/O worsening leg swelling and wt gain.  Feels tired all the time.  States that she has DOE.  She does not complain of orthopnea.  Brought in meds for my review.    No labs in over one year.  Echocardiogram 2 years ago suggested diastolic dysfunction.  Has known carotid arteriosclerosis so at risk for CAD.  States that she gets some brief chest pain rarely.  No known kidney or liver disease.  Already taking lasix bid.  States the current dose makes her urinate frequently for hours after taking it.    Brought in all her meds for quality med rec.    OBJECTIVE:   BP (!) 154/84   Pulse 87   Ht 5' (1.524 m)   Wt 186 lb (84.4 kg)   SpO2 97%   BMI 36.33 kg/m   Neck, no obvious JVD Lungs clear Cardiac RRR without m or g Abd benign Ext 3+ edema bilaterally.  Legs mild red without skin breakdown.    ASSESSMENT/PLAN:   Chronic diastolic heart failure Likely her fluid retention is cardiac in origin.  Will get check renal, liver and thyroid function for differential.  BNP.  If BNP up, may need repeat echo.  HYPERTENSION, BENIGN A bit up today.  Clearly needs more diuresis.  No med changes until I see labs.  Carotid artery stenosis Asymptomatic.  On ASA and rosuvastatin for secondary prevention.  At risk for ischemic cardiomyopathy.     Moses Manners, MD [facility]

## 2021-01-30 ENCOUNTER — Telehealth: Payer: Self-pay | Admitting: Family Medicine

## 2021-01-30 DIAGNOSIS — D649 Anemia, unspecified: Secondary | ICD-10-CM | POA: Insufficient documentation

## 2021-01-30 LAB — CBC
Hematocrit: 31.7 % — ABNORMAL LOW (ref 34.0–46.6)
Hemoglobin: 9.9 g/dL — ABNORMAL LOW (ref 11.1–15.9)
MCH: 24.8 pg — ABNORMAL LOW (ref 26.6–33.0)
MCHC: 31.2 g/dL — ABNORMAL LOW (ref 31.5–35.7)
MCV: 79 fL (ref 79–97)
Platelets: 379 10*3/uL (ref 150–450)
RBC: 4 x10E6/uL (ref 3.77–5.28)
RDW: 15.7 % — ABNORMAL HIGH (ref 11.7–15.4)
WBC: 9.1 10*3/uL (ref 3.4–10.8)

## 2021-01-30 LAB — LIPID PANEL
Chol/HDL Ratio: 2.3 ratio (ref 0.0–4.4)
Cholesterol, Total: 145 mg/dL (ref 100–199)
HDL: 62 mg/dL (ref >39)
LDL Chol Calc (NIH): 62 mg/dL (ref 0–99)
Triglycerides: 122 mg/dL (ref 0–149)
VLDL Cholesterol Cal: 21 mg/dL (ref 5–40)

## 2021-01-30 LAB — BRAIN NATRIURETIC PEPTIDE: BNP: 98.7 pg/mL (ref 0.0–100.0)

## 2021-01-30 LAB — CMP14+EGFR
ALT: 14 IU/L (ref 0–32)
AST: 24 IU/L (ref 0–40)
Albumin/Globulin Ratio: 1.3 (ref 1.2–2.2)
Albumin: 4.1 g/dL (ref 3.7–4.7)
Alkaline Phosphatase: 130 IU/L — ABNORMAL HIGH (ref 44–121)
BUN/Creatinine Ratio: 11 — ABNORMAL LOW (ref 12–28)
BUN: 9 mg/dL (ref 8–27)
Bilirubin Total: 0.2 mg/dL (ref 0.0–1.2)
CO2: 25 mmol/L (ref 20–29)
Calcium: 9.1 mg/dL (ref 8.7–10.3)
Chloride: 103 mmol/L (ref 96–106)
Creatinine, Ser: 0.79 mg/dL (ref 0.57–1.00)
Globulin, Total: 3.1 g/dL (ref 1.5–4.5)
Glucose: 99 mg/dL (ref 65–99)
Potassium: 4.9 mmol/L (ref 3.5–5.2)
Sodium: 140 mmol/L (ref 134–144)
Total Protein: 7.2 g/dL (ref 6.0–8.5)
eGFR: 78 mL/min/{1.73_m2} (ref >59)

## 2021-01-30 LAB — TSH: TSH: 7.71 u[IU]/mL — ABNORMAL HIGH (ref 0.450–4.500)

## 2021-01-30 MED ORDER — LEVOTHYROXINE SODIUM 88 MCG PO TABS: 88.0000 ug | ORAL_TABLET | ORAL | 3 refills | Status: DC

## 2021-01-30 MED ORDER — FERROUS SULFATE 324 (65 FE) MG PO TBEC: 324.0000 mg | DELAYED_RELEASE_TABLET | Freq: Every day | ORAL | 3 refills | Status: DC

## 2021-01-30 NOTE — Addendum Note (Signed)
Addended by: Moses Manners on: 01/30/2021 04:44 PM   Modules accepted: Orders

## 2021-01-30 NOTE — Assessment & Plan Note (Signed)
Anemia found on bloodwork.  Microcytic so likely iron deficient.  Colonoscopy up to date. Start iron.  Called and she will come in for a ferritin level.

## 2021-01-30 NOTE — Telephone Encounter (Signed)
Patient called.  See result note for changes based on labs.

## 2021-01-30 NOTE — Telephone Encounter (Signed)
Patient is calling and would like for Dr. Leveda Anna to call her as soon as possible when her results from yesterdays labs are available.   The best call back is [phone].

## 2021-02-01 DIAGNOSIS — N3941 Urge incontinence: Secondary | ICD-10-CM | POA: Diagnosis not present

## 2021-02-08 ENCOUNTER — Telehealth: Payer: Self-pay | Admitting: Internal Medicine

## 2021-02-08 NOTE — Telephone Encounter (Signed)
Thanks .Charline Bills agree Theresa Brennan  Dr Rexene Edison

## 2021-02-08 NOTE — Telephone Encounter (Signed)
Pt c/o swelling: STAT is pt has developed SOB within 24 hours  If swelling, where is the swelling located? Legs  How much weight have you gained and in what time span? Yes and 2 weeks time   Have you gained 3 pounds in a day or 5 pounds in a week? 5 lbs in a week   Do you have a log of your daily weights (if so, list)?   Are you currently taking a fluid pill? Yes one in the morning and one at night   Are you currently SOB? Yes  Have you traveled recently? No   Pt c/o Shortness Of Breath: STAT if SOB developed within the last 24 hours or pt is noticeably SOB on the phone  1. Are you currently SOB (can you hear that pt is SOB on the phone)? Yes  2. How long have you been experiencing SOB? 2 weeks   3. Are you SOB when sitting or when up moving around? Both   4. Are you currently experiencing any other symptoms? No

## 2021-02-08 NOTE — Telephone Encounter (Signed)
Spoke to patient she stated for the past 3 to 4 weeks she has been sob.Hard to walk to mailbox and back.She has swelling in both legs.She is on a low salt diet. Weight at present 188 lbs.Stated 4 months ago she weighed 159 lbs.Occasional chest pain.No chest pain at present.Appointment scheduled with Gillian Shields NP 7/6 at 10:00 am at [facility] location.Directions given.Advised to go to ED if she worsens.I will make Dr.Hilty aware.

## 2021-02-19 ENCOUNTER — Other Ambulatory Visit: Payer: Self-pay | Admitting: Family Medicine

## 2021-02-20 ENCOUNTER — Other Ambulatory Visit: Payer: Self-pay

## 2021-02-20 ENCOUNTER — Encounter (HOSPITAL_BASED_OUTPATIENT_CLINIC_OR_DEPARTMENT_OTHER): Payer: Self-pay | Admitting: *Deleted

## 2021-02-20 ENCOUNTER — Emergency Department (HOSPITAL_BASED_OUTPATIENT_CLINIC_OR_DEPARTMENT_OTHER): Payer: Medicare Other

## 2021-02-20 ENCOUNTER — Emergency Department (HOSPITAL_BASED_OUTPATIENT_CLINIC_OR_DEPARTMENT_OTHER)
Admission: EM | Admit: 2021-02-20 | Discharge: 2021-02-20 | Disposition: A | Discharge status: Home / Self Care | Payer: Medicare Other | Attending: Emergency Medicine | Admitting: Emergency Medicine

## 2021-02-20 DIAGNOSIS — N189 Chronic kidney disease, unspecified: Secondary | ICD-10-CM | POA: Insufficient documentation

## 2021-02-20 DIAGNOSIS — R5383 Other fatigue: Secondary | ICD-10-CM | POA: Insufficient documentation

## 2021-02-20 DIAGNOSIS — Z20822 Contact with and (suspected) exposure to covid-19: Secondary | ICD-10-CM | POA: Insufficient documentation

## 2021-02-20 DIAGNOSIS — R059 Cough, unspecified: Secondary | ICD-10-CM | POA: Insufficient documentation

## 2021-02-20 DIAGNOSIS — Z79899 Other long term (current) drug therapy: Secondary | ICD-10-CM | POA: Diagnosis not present

## 2021-02-20 DIAGNOSIS — Z87891 Personal history of nicotine dependence: Secondary | ICD-10-CM | POA: Insufficient documentation

## 2021-02-20 DIAGNOSIS — Z7982 Long term (current) use of aspirin: Secondary | ICD-10-CM | POA: Diagnosis not present

## 2021-02-20 DIAGNOSIS — R609 Edema, unspecified: Secondary | ICD-10-CM

## 2021-02-20 DIAGNOSIS — I130 Hypertensive heart and chronic kidney disease with heart failure and stage 1 through stage 4 chronic kidney disease, or unspecified chronic kidney disease: Secondary | ICD-10-CM | POA: Insufficient documentation

## 2021-02-20 DIAGNOSIS — R600 Localized edema: Secondary | ICD-10-CM | POA: Diagnosis not present

## 2021-02-20 DIAGNOSIS — I5032 Chronic diastolic (congestive) heart failure: Secondary | ICD-10-CM | POA: Diagnosis not present

## 2021-02-20 DIAGNOSIS — E039 Hypothyroidism, unspecified: Secondary | ICD-10-CM | POA: Insufficient documentation

## 2021-02-20 DIAGNOSIS — R0602 Shortness of breath: Secondary | ICD-10-CM

## 2021-02-20 LAB — COMPREHENSIVE METABOLIC PANEL
ALT: 22 U/L (ref 0–44)
AST: 37 U/L (ref 15–41)
Albumin: 3.5 g/dL (ref 3.5–5.0)
Alkaline Phosphatase: 116 U/L (ref 38–126)
Anion gap: 6 (ref 5–15)
BUN: 12 mg/dL (ref 8–23)
CO2: 28 mmol/L (ref 22–32)
Calcium: 8.9 mg/dL (ref 8.9–10.3)
Chloride: 105 mmol/L (ref 98–111)
Creatinine, Ser: 0.75 mg/dL (ref 0.44–1.00)
GFR, Estimated: >60 mL/min (ref >60)
Glucose, Bld: 83 mg/dL (ref 70–99)
Potassium: 4.5 mmol/L (ref 3.5–5.1)
Sodium: 139 mmol/L (ref 135–145)
Total Bilirubin: 0.5 mg/dL (ref 0.3–1.2)
Total Protein: 7.1 g/dL (ref 6.5–8.1)

## 2021-02-20 LAB — CBC WITH DIFFERENTIAL/PLATELET
Abs Immature Granulocytes: 0.03 10*3/uL (ref 0.00–0.07)
Basophils Absolute: 0.1 10*3/uL (ref 0.0–0.1)
Basophils Relative: 1 %
Eosinophils Absolute: 0.2 10*3/uL (ref 0.0–0.5)
Eosinophils Relative: 3 %
HCT: 32.3 % — ABNORMAL LOW (ref 36.0–46.0)
Hemoglobin: 9.5 g/dL — ABNORMAL LOW (ref 12.0–15.0)
Immature Granulocytes: 0 %
Lymphocytes Relative: 33 %
Lymphs Abs: 2.6 10*3/uL (ref 0.7–4.0)
MCH: 23.3 pg — ABNORMAL LOW (ref 26.0–34.0)
MCHC: 29.4 g/dL — ABNORMAL LOW (ref 30.0–36.0)
MCV: 79.2 fL — ABNORMAL LOW (ref 80.0–100.0)
Monocytes Absolute: 0.5 10*3/uL (ref 0.1–1.0)
Monocytes Relative: 6 %
Neutro Abs: 4.6 10*3/uL (ref 1.7–7.7)
Neutrophils Relative %: 57 %
Platelets: 397 10*3/uL (ref 150–400)
RBC: 4.08 MIL/uL (ref 3.87–5.11)
RDW: 17.4 % — ABNORMAL HIGH (ref 11.5–15.5)
WBC: 8 10*3/uL (ref 4.0–10.5)
nRBC: 0 % (ref 0.0–0.2)

## 2021-02-20 LAB — URINALYSIS, ROUTINE W REFLEX MICROSCOPIC
Bilirubin Urine: NEGATIVE
Glucose, UA: NEGATIVE mg/dL
Hgb urine dipstick: NEGATIVE
Ketones, ur: NEGATIVE mg/dL
Leukocytes,Ua: NEGATIVE
Nitrite: NEGATIVE
Protein, ur: NEGATIVE mg/dL
Specific Gravity, Urine: <1.005 — ABNORMAL LOW (ref 1.005–1.030)
pH: 7 (ref 5.0–8.0)

## 2021-02-20 LAB — RESP PANEL BY RT-PCR (FLU A&B, COVID) ARPGX2
Influenza A by PCR: NEGATIVE
Influenza B by PCR: NEGATIVE
SARS Coronavirus 2 by RT PCR: NEGATIVE

## 2021-02-20 LAB — BRAIN NATRIURETIC PEPTIDE: B Natriuretic Peptide: 169.4 pg/mL — ABNORMAL HIGH (ref 0.0–100.0)

## 2021-02-20 LAB — TROPONIN I (HIGH SENSITIVITY): Troponin I (High Sensitivity): 7 ng/L (ref <18)

## 2021-02-20 NOTE — ED Notes (Signed)
Pt bilateral lower extremity swelling, pt states dyspnea with any activity.

## 2021-02-20 NOTE — ED Notes (Signed)
Pt ambulated and maintained SpO2 at 99%.

## 2021-02-20 NOTE — Discharge Instructions (Addendum)
Your history, exam, work-up today were overall reassuring and consistent with some fluid overload causing your fatigue and likely some mild shortness of breath.  We did not see any other concerning findings on your work-up and given your lack of low oxygen with ambulation and your otherwise well appearance, we do feel you are safe for discharge home.  Your kidney function was reassuring so please call your PCP tomorrow to discuss further changes to your diuretic regimen.  Please rest.  Please keep your legs elevated and if you can use some sort of compressive stocking like a sock.  If any symptoms change or worsen, please return to the nearest emergency department.
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
## 2021-02-20 NOTE — ED Triage Notes (Signed)
Legs, feet and body have been swollen x 3 weeks. Her Lasix dosage was increased with no relief. Her Thyroid medication was increased and she was told to start taking Iron.

## 2021-02-21 ENCOUNTER — Ambulatory Visit: Payer: Medicare Other | Admitting: Vascular Surgery

## 2021-02-21 ENCOUNTER — Ambulatory Visit: Payer: Medicare Other

## 2021-02-21 ENCOUNTER — Ambulatory Visit (HOSPITAL_COMMUNITY)
Admission: RE | Admit: 2021-02-21 | Discharge: 2021-02-21 | Disposition: A | Discharge status: Home / Self Care | Payer: Medicare Other | Source: Ambulatory Visit | Attending: Vascular Surgery | Admitting: Vascular Surgery

## 2021-02-21 ENCOUNTER — Telehealth: Payer: Self-pay

## 2021-02-21 ENCOUNTER — Other Ambulatory Visit: Payer: Self-pay

## 2021-02-21 DIAGNOSIS — I8390 Asymptomatic varicose veins of unspecified lower extremity: Secondary | ICD-10-CM | POA: Diagnosis not present

## 2021-02-21 DIAGNOSIS — Z87440 Personal history of urinary (tract) infections: Secondary | ICD-10-CM | POA: Insufficient documentation

## 2021-02-21 LAB — TSH: TSH: 3.096 u[IU]/mL (ref 0.350–4.500)

## 2021-02-21 NOTE — Telephone Encounter (Signed)
Patient's granddaughter calls nurse line regarding follow up from ED last night. Toni Amend reports that patient is having increased edema in both legs and that legs are weeping and are covered in blisters. Patient is having difficulty with mobility due to pain in legs. Denies shortness of breath and chest pain at this time.   Per ED note, patient was to reach out to PCP office today for further management of lasix/ diuretic changes.   Scheduled patient for 1:30 this afternoon for further evaluation.   Strict ED precautions given in the meantime.   Veronda Prude, RN

## 2021-02-22 ENCOUNTER — Encounter: Payer: Self-pay | Admitting: Vascular Surgery

## 2021-02-22 ENCOUNTER — Other Ambulatory Visit: Payer: Self-pay

## 2021-02-22 ENCOUNTER — Ambulatory Visit (INDEPENDENT_AMBULATORY_CARE_PROVIDER_SITE_OTHER): Payer: Medicare Other | Admitting: Vascular Surgery

## 2021-02-22 VITALS — BP 162/86 | HR 75 | Temp 98.2°F | Resp 20 | Ht 60.0 in | Wt 189.0 lb

## 2021-02-22 DIAGNOSIS — M7989 Other specified soft tissue disorders: Secondary | ICD-10-CM | POA: Diagnosis not present

## 2021-02-22 DIAGNOSIS — I6529 Occlusion and stenosis of unspecified carotid artery: Secondary | ICD-10-CM | POA: Diagnosis not present

## 2021-02-22 LAB — URINE CULTURE: Culture: 20000 — AB
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
## 2021-02-23 ENCOUNTER — Encounter: Payer: Self-pay | Admitting: Internal Medicine

## 2021-02-23 ENCOUNTER — Ambulatory Visit (INDEPENDENT_AMBULATORY_CARE_PROVIDER_SITE_OTHER): Payer: Medicare Other | Admitting: Internal Medicine

## 2021-02-23 VITALS — BP 136/66 | HR 90 | Ht 61.0 in | Wt 188.6 lb

## 2021-02-23 DIAGNOSIS — I6529 Occlusion and stenosis of unspecified carotid artery: Secondary | ICD-10-CM | POA: Diagnosis not present

## 2021-02-23 DIAGNOSIS — M7989 Other specified soft tissue disorders: Secondary | ICD-10-CM | POA: Diagnosis not present

## 2021-02-23 DIAGNOSIS — I5033 Acute on chronic diastolic (congestive) heart failure: Secondary | ICD-10-CM

## 2021-02-23 DIAGNOSIS — R0600 Dyspnea, unspecified: Secondary | ICD-10-CM

## 2021-02-23 MED ORDER — METOLAZONE 2.5 MG PO TABS: 2.5000 mg | ORAL_TABLET | ORAL | 3 refills | Status: DC
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
## 2021-02-23 NOTE — Patient Instructions (Signed)
Medication Instructions:  START metolazone 2.5mg  every other day  -- take on Saturday, Monday, Wednesday  *If you need a refill on your cardiac medications before your next appointment, please call your pharmacy*   Lab Work: CMET, BNP on Wednesday 7/6  If you have labs (blood work) drawn today and your tests are completely normal, you will receive your results only by: MyChart Message (if you have MyChart) OR A paper copy in the mail If you have any lab test that is abnormal or we need to change your treatment, we will call you to review the results.   Testing/Procedures: Your physician has requested that you have an echocardiogram. Echocardiography is a painless test that uses sound waves to create images of your heart. It provides your doctor with information about the size and shape of your heart and how well your heart's chambers and valves are working. This procedure takes approximately one hour. There are no restrictions for this procedure. -- [address]   Follow-Up: At [facility], you and your health needs are our priority.  As part of our continuing mission to provide you with exceptional heart care, we have created designated Provider Care Teams.  These Care Teams include your primary Cardiologist (physician) and Advanced Practice Providers (APPs -  Physician Assistants and Nurse Practitioners) who all work together to provide you with the care you need, when you need it.  We recommend signing up for the patient portal called "MyChart".  Sign up information is provided on this After Visit Summary.  MyChart is used to connect with patients for Virtual Visits (Telemedicine).  Patients are able to view lab/test results, encounter notes, upcoming appointments, etc.  Non-urgent messages can be sent to your provider as well.   To learn more about what you can do with MyChart, go to [email or web address].    Your next appointment:   1 month(s)  The format  for your next appointment:   In Person  Provider:   You may see Chrystie Nose, MD or one of the following Advanced Practice Providers on your designated Care Team:   Azalee Course, PA-C Micah Flesher, PA-C or  Judy Pimple, [place]  OK to double book on a DOD day if needed   Other Instructions

## 2021-02-24 ENCOUNTER — Telehealth: Payer: Self-pay | Admitting: Emergency Medicine

## 2021-02-24 NOTE — Telephone Encounter (Signed)
Post ED Visit - Positive Culture Follow-up  Culture report reviewed by antimicrobial stewardship pharmacist: [facility] []  , Pharm.D. []  Enzo Bi, Pharm.D., BCPS AQ-ID []  , Pharm.D., BCPS []  Celedonio Miyamoto, Pharm.D., BCPS []  [place], Garvin Fila.D., BCPS, AAHIVP []  , Pharm.D., BCPS, AAHIVP []  Georgina Pillion, PharmD, BCPS []  , PharmD, BCPS []  [place], PharmD, BCPS [x]  [place], PharmD []  , PharmD, BCPS []  Estella Husk, PharmD  Pharmacy Team []  Lysle Pearl, PharmD []  , PharmD []  Phillips Climes, PharmD []  , Rph []  Agapito Games) , PharmD []  Loleta Dicker, PharmD []  , PharmD []  Mervyn Gay, PharmD []  , PharmD []  Vinnie Level, PharmD []  Wonda Olds, PharmD []  , PharmD []  Len Childs, PharmD   Positive urine culture No further patient follow-up is required at this time.  Eean Buss 02/24/2021, 4:29 PM

## 2021-02-27 ENCOUNTER — Encounter: Payer: Medicare Other | Admitting: Vascular Surgery

## 2021-02-27 ENCOUNTER — Encounter (HOSPITAL_COMMUNITY): Payer: Medicare Other

## 2021-02-28 ENCOUNTER — Ambulatory Visit (HOSPITAL_BASED_OUTPATIENT_CLINIC_OR_DEPARTMENT_OTHER): Payer: Medicare Other | Admitting: Family

## 2021-03-08 ENCOUNTER — Other Ambulatory Visit: Payer: Self-pay | Admitting: Family Medicine

## 2021-03-08 DIAGNOSIS — E785 Hyperlipidemia, unspecified: Secondary | ICD-10-CM

## 2021-03-13 DIAGNOSIS — I5033 Acute on chronic diastolic (congestive) heart failure: Secondary | ICD-10-CM | POA: Diagnosis not present

## 2021-03-13 DIAGNOSIS — R0600 Dyspnea, unspecified: Secondary | ICD-10-CM | POA: Diagnosis not present

## 2021-03-13 DIAGNOSIS — M7989 Other specified soft tissue disorders: Secondary | ICD-10-CM | POA: Diagnosis not present

## 2021-03-14 ENCOUNTER — Other Ambulatory Visit: Payer: Self-pay

## 2021-03-14 ENCOUNTER — Ambulatory Visit (HOSPITAL_COMMUNITY): Payer: Medicare Other | Attending: Cardiology

## 2021-03-14 DIAGNOSIS — I5033 Acute on chronic diastolic (congestive) heart failure: Secondary | ICD-10-CM | POA: Diagnosis not present

## 2021-03-14 DIAGNOSIS — M7989 Other specified soft tissue disorders: Secondary | ICD-10-CM | POA: Insufficient documentation

## 2021-03-14 DIAGNOSIS — R0600 Dyspnea, unspecified: Secondary | ICD-10-CM | POA: Diagnosis not present

## 2021-03-14 LAB — COMPREHENSIVE METABOLIC PANEL
ALT: 18 IU/L (ref 0–32)
AST: 30 IU/L (ref 0–40)
Albumin/Globulin Ratio: 1.4 (ref 1.2–2.2)
Albumin: 4.2 g/dL (ref 3.7–4.7)
Alkaline Phosphatase: 130 IU/L — ABNORMAL HIGH (ref 44–121)
BUN/Creatinine Ratio: 16 (ref 12–28)
BUN: 13 mg/dL (ref 8–27)
Bilirubin Total: 0.2 mg/dL (ref 0.0–1.2)
CO2: 28 mmol/L (ref 20–29)
Calcium: 9.8 mg/dL (ref 8.7–10.3)
Chloride: 97 mmol/L (ref 96–106)
Creatinine, Ser: 0.79 mg/dL (ref 0.57–1.00)
Globulin, Total: 2.9 g/dL (ref 1.5–4.5)
Glucose: 93 mg/dL (ref 65–99)
Potassium: 3.8 mmol/L (ref 3.5–5.2)
Sodium: 142 mmol/L (ref 134–144)
Total Protein: 7.1 g/dL (ref 6.0–8.5)
eGFR: 78 mL/min/{1.73_m2} (ref >59)

## 2021-03-14 LAB — ECHOCARDIOGRAM COMPLETE
Area-P 1/2: 3.31 cm2
S' Lateral: 2.9 cm

## 2021-03-14 LAB — BRAIN NATRIURETIC PEPTIDE: BNP: 123.7 pg/mL — ABNORMAL HIGH (ref 0.0–100.0)

## 2021-03-15 ENCOUNTER — Ambulatory Visit (INDEPENDENT_AMBULATORY_CARE_PROVIDER_SITE_OTHER): Payer: Medicare Other | Admitting: Family Medicine

## 2021-03-15 ENCOUNTER — Encounter: Payer: Self-pay | Admitting: Family Medicine

## 2021-03-15 DIAGNOSIS — J329 Chronic sinusitis, unspecified: Secondary | ICD-10-CM | POA: Diagnosis not present

## 2021-03-15 DIAGNOSIS — I5032 Chronic diastolic (congestive) heart failure: Secondary | ICD-10-CM | POA: Diagnosis not present

## 2021-03-15 DIAGNOSIS — J301 Allergic rhinitis due to pollen: Secondary | ICD-10-CM

## 2021-03-15 MED ORDER — CETIRIZINE HCL 10 MG PO TABS: 10.0000 mg | ORAL_TABLET | Freq: Every day | ORAL | 11 refills | Status: DC

## 2021-03-15 MED ORDER — AZITHROMYCIN 250 MG PO TABS: 250.0000 mg | ORAL_TABLET | Freq: Every day | ORAL | 0 refills | Status: DC

## 2021-03-15 MED ORDER — FLUTICASONE PROPIONATE 50 MCG/ACT NA SUSP: 2.0000 | Freq: Every day | NASAL | 6 refills | Status: DC

## 2021-03-15 NOTE — Assessment & Plan Note (Signed)
Acute on chronic.  Amox allergic.  While I prefer to wait 7-10 days prior to antibiotics, patient strongly preferred RX now.

## 2021-03-15 NOTE — Assessment & Plan Note (Signed)
Refill flonase and ceterizine

## 2021-03-15 NOTE — Progress Notes (Signed)
    SUBJECTIVE:   CHIEF COMPLAINT / HPI:   Originally scheduled for face-to-face visit.  Changed to phone visit due to being sick. Acute illness.  Flair of her chronic sinusitis.  Has had nasal drainage for weeks.  2 days of severe congestion and purulent drainage.  Emphasizes that she typically does not improve without antibiotics.  Has not been using zyrtec or flonase: "I need new prescriptions. Diastolic dysfunction CHF.  Dry weight likely ~160 lbs.  Reviewed notes, blood work and echo from cards.  She has finally begun diuresing with the addition of mealizone.  Wt on her scale today was 177 lbs.    OBJECTIVE:   There were no vitals taken for this visit.  No SOB over phone.  Speaks in full sentences.  ASSESSMENT/PLAN:   ALLERGIC RHINITIS, SEASONAL Refill flonase and ceterizine  Chronic diastolic heart failure (HCC) Finally imporving.  See me in 2-3 weeks for weight check and BMP    Moses Manners, MD [facility]

## 2021-03-15 NOTE — Patient Instructions (Addendum)
Instructed to see me face-to-face in 2-3 weeks for weight and BMP for her diastolic CHF. Duration of phone call was 15 minutes.

## 2021-03-15 NOTE — Assessment & Plan Note (Signed)
Finally imporving.  See me in 2-3 weeks for weight check and BMP

## 2021-03-16 DIAGNOSIS — U071 COVID-19: Secondary | ICD-10-CM | POA: Diagnosis not present

## 2021-03-20 ENCOUNTER — Telehealth: Payer: Self-pay

## 2021-03-20 NOTE — Telephone Encounter (Signed)
Patient calls nurse line regarding testing positive for COVID. Reports positive test result last night (7/25). Symptom onset Saturday evening. Reports headache, cough, nasal congestion, loss of taste and smell. Patient has received two doses of Pfizer vaccine.   Denies SHOB, difficulty breathing or chest pain.   Patient was instructed to reach out to PCP regarding initiation of possible antibody therapy.   Please advise if patient would be a good candidate for treatment.   Provided patient with supportive measures and ED precautions.   Veronda Prude, RN

## 2021-03-21 MED ORDER — PAXLOVID 20 X 150 MG & 10 X 100MG PO TBPK: ORAL_TABLET | ORAL | 0 refills | Status: AC

## 2021-03-21 MED ORDER — GUAIFENESIN-DM 100-10 MG/5ML PO SYRP: 5.0000 mL | ORAL_SOLUTION | ORAL | 3 refills | Status: DC | PRN

## 2021-03-21 NOTE — Telephone Encounter (Signed)
Patient called back this morning requesting medication to keep her symptoms from getting worse.  I let her know that provider was out of the office when she called yesterday but was today.  Theresa Brennan,CMA

## 2021-03-21 NOTE — Telephone Encounter (Signed)
Covid sx x 5 days.  No SOB.  Cough and weakness.  Wants cough med and antiviral.  Explained right at 5 day window and unclear if helpful.  Still wants Rx.  Told to hod statin and stop azithro.

## 2021-03-21 NOTE — Progress Notes (Signed)
Addendum.  Patient gave consent for telehealth visit.  Time spent on the phone with patient was 17 minutes.  Patient was at her home.  I was in my [facility] office.

## 2021-03-21 NOTE — Telephone Encounter (Signed)
Attempted to call.  Left VM saying I would call again.

## 2021-04-06 ENCOUNTER — Other Ambulatory Visit: Payer: Self-pay | Admitting: Family Medicine

## 2021-04-10 ENCOUNTER — Ambulatory Visit: Payer: Medicare Other | Admitting: Internal Medicine

## 2021-04-11 ENCOUNTER — Telehealth: Payer: Self-pay

## 2021-04-11 NOTE — Telephone Encounter (Signed)
Patient calls nurse line requesting to schedule appointment as soon as possible regarding continued weakness post COVID infection. Patient was COVID positive on 7/25. Reports that she also has a decreased appetite and slight dizziness with activity.   Denies falls, head injuries or syncopal episodes.   Scheduled patient for next available appointment on 8/25. Provided with ED precautions.   Spoke with Dr. Leveda Anna regarding patient. Agrees with current plan.   Veronda Prude, RN

## 2021-04-12 NOTE — Telephone Encounter (Signed)
Yes, discussed and agree

## 2021-04-19 ENCOUNTER — Ambulatory Visit: Payer: Medicare Other

## 2021-04-19 NOTE — Progress Notes (Deleted)
    SUBJECTIVE:   CHIEF COMPLAINT / HPI:   ***  PERTINENT  PMH / PSH: ***  OBJECTIVE:   There were no vitals taken for this visit.   General: Alert, no acute distress Cardio: Normal S1 and S2, RRR, no r/m/g Pulm: CTAB, normal work of breathing Abdomen: Bowel sounds normal. Abdomen soft and non-tender.  Extremities: No peripheral edema.  Neuro: Cranial nerves grossly intact   ASSESSMENT/PLAN:   No problem-specific Assessment & Plan notes found for this encounter.     Jerome Otter, MD [facility]   

## 2021-04-29 ENCOUNTER — Encounter (HOSPITAL_COMMUNITY): Payer: Self-pay | Admitting: Emergency Medicine

## 2021-04-29 ENCOUNTER — Emergency Department (HOSPITAL_COMMUNITY)
Admission: EM | Admit: 2021-04-29 | Discharge: 2021-04-29 | Disposition: A | Discharge status: Home / Self Care | Payer: Medicare Other | Attending: Emergency Medicine | Admitting: Emergency Medicine

## 2021-04-29 DIAGNOSIS — Z7982 Long term (current) use of aspirin: Secondary | ICD-10-CM | POA: Diagnosis not present

## 2021-04-29 DIAGNOSIS — Y93K3 Activity, grooming and shearing an animal: Secondary | ICD-10-CM | POA: Insufficient documentation

## 2021-04-29 DIAGNOSIS — S0590XA Unspecified injury of unspecified eye and orbit, initial encounter: Secondary | ICD-10-CM | POA: Diagnosis not present

## 2021-04-29 DIAGNOSIS — Z87891 Personal history of nicotine dependence: Secondary | ICD-10-CM | POA: Insufficient documentation

## 2021-04-29 DIAGNOSIS — S0501XA Injury of conjunctiva and corneal abrasion without foreign body, right eye, initial encounter: Secondary | ICD-10-CM | POA: Diagnosis not present

## 2021-04-29 DIAGNOSIS — N189 Chronic kidney disease, unspecified: Secondary | ICD-10-CM | POA: Insufficient documentation

## 2021-04-29 DIAGNOSIS — E039 Hypothyroidism, unspecified: Secondary | ICD-10-CM | POA: Diagnosis not present

## 2021-04-29 DIAGNOSIS — W272XXA Contact with scissors, initial encounter: Secondary | ICD-10-CM | POA: Insufficient documentation

## 2021-04-29 DIAGNOSIS — Z79899 Other long term (current) drug therapy: Secondary | ICD-10-CM | POA: Insufficient documentation

## 2021-04-29 DIAGNOSIS — Z955 Presence of coronary angioplasty implant and graft: Secondary | ICD-10-CM | POA: Insufficient documentation

## 2021-04-29 DIAGNOSIS — I10 Essential (primary) hypertension: Secondary | ICD-10-CM | POA: Diagnosis not present

## 2021-04-29 DIAGNOSIS — I130 Hypertensive heart and chronic kidney disease with heart failure and stage 1 through stage 4 chronic kidney disease, or unspecified chronic kidney disease: Secondary | ICD-10-CM | POA: Diagnosis not present

## 2021-04-29 DIAGNOSIS — I5032 Chronic diastolic (congestive) heart failure: Secondary | ICD-10-CM | POA: Insufficient documentation

## 2021-04-29 DIAGNOSIS — S0591XA Unspecified injury of right eye and orbit, initial encounter: Secondary | ICD-10-CM | POA: Diagnosis present

## 2021-04-29 MED ORDER — ERYTHROMYCIN 5 MG/GM OP OINT: TOPICAL_OINTMENT | OPHTHALMIC | 0 refills | Status: DC

## 2021-04-29 MED ORDER — TETRACAINE HCL 0.5 % OP SOLN
2.0000 [drp] | Freq: Once | OPHTHALMIC | Status: AC
  Administered 2021-04-29: 2 [drp] via OPHTHALMIC
  Filled 2021-04-29: qty 4

## 2021-04-29 MED ORDER — FLUORESCEIN SODIUM 1 MG OP STRP
1.0000 | ORAL_STRIP | Freq: Once | OPHTHALMIC | Status: AC
  Administered 2021-04-29: 1 via OPHTHALMIC
  Filled 2021-04-29: qty 1

## 2021-04-29 NOTE — ED Triage Notes (Signed)
Pt to triage via [facility] from home.  She hit R eye with scissors while trimming her dog.  Reports R eye blurred vision.  DT last year.

## 2021-04-29 NOTE — ED Notes (Signed)
E-signature pad unavailable at time of pt discharge. This RN discussed discharge materials with pt and answered all pt questions. Pt stated understanding of discharge material. ? ?
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
## 2021-04-29 NOTE — Discharge Instructions (Addendum)
You came today to be evaluated for your right eye injury.  Your physical exam showed that you have a corneal abrasion.  Because of this you are prescribed antibiotic eye ointment.  You can take at this appointment antimigraine timecard while assessed this is a 24-hour pharmacy.  Please take this as prescribed.  Please follow-up with the ophthalmologist Dr. Genia Del, call his office on Tuesday to schedule an appointment for this coming week.  Please take Ibuprofen (Advil, motrin) and Tylenol (acetaminophen) to relieve your pain.    You may take up to 600 MG (3 pills) of normal strength ibuprofen every 8 hours as needed.   You make take tylenol, up to 1,000 mg (two extra strength pills) every 8 hours as needed.   It is safe to take ibuprofen and tylenol at the same time as they work differently.   Do not take more than 3,000 mg tylenol in a 24 hour period (not more than one dose every 8 hours.  Please check all medication labels as many medications such as pain and cold medications may contain tylenol.  Do not drink alcohol while taking these medications.  Do not take other NSAID'S while taking ibuprofen (such as aleve or naproxen).  Please take ibuprofen with food to decrease stomach upset.   Get help right away if: You have severe eye pain that does not get better with medicine. You have vision loss.

## 2021-04-29 NOTE — ED Notes (Signed)
Pt notified EMT she wanted to speak with an RN about eye. This RN spoke to pt in lobby. Pt tearful about having to wait and explained eye hurt very badly and it feels like sandpaper. This RN thanked pt for being able to wait and she would be seen as soon as it was possible.

## 2021-04-29 NOTE — ED Notes (Signed)
EDP at pt bedside for eye exam

## 2021-05-01 ENCOUNTER — Other Ambulatory Visit: Payer: Self-pay | Admitting: Gastroenterology

## 2021-05-02 DIAGNOSIS — Z79891 Long term (current) use of opiate analgesic: Secondary | ICD-10-CM | POA: Diagnosis not present

## 2021-05-02 DIAGNOSIS — M4726 Other spondylosis with radiculopathy, lumbar region: Secondary | ICD-10-CM | POA: Diagnosis not present

## 2021-05-02 DIAGNOSIS — I10 Essential (primary) hypertension: Secondary | ICD-10-CM | POA: Diagnosis not present

## 2021-05-02 DIAGNOSIS — Z8679 Personal history of other diseases of the circulatory system: Secondary | ICD-10-CM | POA: Diagnosis not present

## 2021-05-02 DIAGNOSIS — M5116 Intervertebral disc disorders with radiculopathy, lumbar region: Secondary | ICD-10-CM | POA: Diagnosis not present

## 2021-05-02 DIAGNOSIS — M5126 Other intervertebral disc displacement, lumbar region: Secondary | ICD-10-CM | POA: Diagnosis not present

## 2021-05-02 DIAGNOSIS — S0501XD Injury of conjunctiva and corneal abrasion without foreign body, right eye, subsequent encounter: Secondary | ICD-10-CM | POA: Diagnosis not present

## 2021-05-22 ENCOUNTER — Other Ambulatory Visit: Payer: Self-pay | Admitting: Family Medicine

## 2021-05-22 DIAGNOSIS — I5032 Chronic diastolic (congestive) heart failure: Secondary | ICD-10-CM

## 2021-05-28 ENCOUNTER — Ambulatory Visit (INDEPENDENT_AMBULATORY_CARE_PROVIDER_SITE_OTHER): Payer: Medicare Other | Admitting: Family Medicine

## 2021-05-28 ENCOUNTER — Other Ambulatory Visit: Payer: Self-pay

## 2021-05-28 ENCOUNTER — Encounter: Payer: Self-pay | Admitting: Family Medicine

## 2021-05-28 DIAGNOSIS — I5032 Chronic diastolic (congestive) heart failure: Secondary | ICD-10-CM

## 2021-05-28 DIAGNOSIS — I6529 Occlusion and stenosis of unspecified carotid artery: Secondary | ICD-10-CM | POA: Diagnosis not present

## 2021-05-28 DIAGNOSIS — E038 Other specified hypothyroidism: Secondary | ICD-10-CM

## 2021-05-28 DIAGNOSIS — D649 Anemia, unspecified: Secondary | ICD-10-CM

## 2021-05-28 DIAGNOSIS — M7989 Other specified soft tissue disorders: Secondary | ICD-10-CM

## 2021-05-28 MED ORDER — LEVOTHYROXINE SODIUM 88 MCG PO TABS: 88.0000 ug | ORAL_TABLET | ORAL | 3 refills | Status: DC

## 2021-05-28 NOTE — Patient Instructions (Signed)
I will call with lab test results. I likely will not have good or easy answers for you. Leg elevation - remember, higher than your heart - is a great treatment that will not cause problems. We will talk about other changes when I changes when I call.

## 2021-05-28 NOTE — Assessment & Plan Note (Signed)
Recheck TSH 

## 2021-05-28 NOTE — Progress Notes (Signed)
    SUBJECTIVE:   CHIEF COMPLAINT / HPI:   FU leg swelling. Patient C/O continued leg swelling.  Worse at night and better in the morning.  Legs have skin changes.  Has been seen by cards with dx of HFpEF and by [facility] who did not identify a vascular problem.  Currently on lasix and zaroxylyn.  Takes potassium supplements.  "I cannot wear compression stockings.    Hx of hypothyroid.  On replacement.  Dose recently increased.  Hx of anemia.  On iron.  No recent recheck    OBJECTIVE:   BP 140/69   Pulse 72   Ht 5\' 1"  (1.549 m)   Wt 179 lb 12.8 oz (81.6 kg)   SpO2 100%   BMI 33.97 kg/m   Skin and conjunctiva not pale Lungs clear Cardiac RRR without m or g Ext 3+ edema both legs with hemosiderin skin changes.    ASSESSMENT/PLAN:   Subclinical hypothyroidism Recheck TSH  Anemia Check CBC and ferritin.  Note: last colonoscopy was 02/2020.     Chronic diastolic heart failure (HCC) Obvious peripheral edema.  No evidence of central edema on exam.  Leg swelling Exam is most consistent with chronic venous insufficiency. Clearly has 10+ lbs of edema in her legs. Avoid overuse of diuretics. States cannot use compression stockings Discussed leg elevation at length.  Await labs before doing any med changes.       03/2020, MD [facility]

## 2021-05-28 NOTE — Assessment & Plan Note (Signed)
Exam is most consistent with chronic venous insufficiency. Clearly has 10+ lbs of edema in her legs. Avoid overuse of diuretics. States cannot use compression stockings Discussed leg elevation at length.  Await labs before doing any med changes.

## 2021-05-28 NOTE — Assessment & Plan Note (Signed)
Check CBC and ferritin.  Note: last colonoscopy was 02/2020.

## 2021-05-28 NOTE — Assessment & Plan Note (Signed)
Obvious peripheral edema.  No evidence of central edema on exam.

## 2021-05-29 LAB — BASIC METABOLIC PANEL
BUN/Creatinine Ratio: 14 (ref 12–28)
BUN: 10 mg/dL (ref 8–27)
CO2: 25 mmol/L (ref 20–29)
Calcium: 9.4 mg/dL (ref 8.7–10.3)
Chloride: 106 mmol/L (ref 96–106)
Creatinine, Ser: 0.72 mg/dL (ref 0.57–1.00)
Glucose: 90 mg/dL (ref 70–99)
Potassium: 5.1 mmol/L (ref 3.5–5.2)
Sodium: 144 mmol/L (ref 134–144)
eGFR: 87 mL/min/{1.73_m2} (ref >59)

## 2021-05-29 LAB — CBC
Hematocrit: 35.8 % (ref 34.0–46.6)
Hemoglobin: 11.2 g/dL (ref 11.1–15.9)
MCH: 26.3 pg — ABNORMAL LOW (ref 26.6–33.0)
MCHC: 31.3 g/dL — ABNORMAL LOW (ref 31.5–35.7)
MCV: 84 fL (ref 79–97)
Platelets: 354 10*3/uL (ref 150–450)
RBC: 4.26 x10E6/uL (ref 3.77–5.28)
RDW: 19.9 % — ABNORMAL HIGH (ref 11.7–15.4)
WBC: 6.5 10*3/uL (ref 3.4–10.8)

## 2021-05-29 LAB — FERRITIN: Ferritin: 23 ng/mL (ref 15–150)

## 2021-05-29 LAB — TSH: TSH: 8.25 u[IU]/mL — ABNORMAL HIGH (ref 0.450–4.500)

## 2021-05-29 MED ORDER — LEVOTHYROXINE SODIUM 100 MCG PO TABS: 100.0000 ug | ORAL_TABLET | ORAL | 3 refills | Status: DC

## 2021-05-29 NOTE — Addendum Note (Signed)
Addended by: Moses Manners on: 05/29/2021 05:31 PM   Modules accepted: Orders

## 2021-06-01 DIAGNOSIS — U071 COVID-19: Secondary | ICD-10-CM | POA: Diagnosis not present

## 2021-06-11 ENCOUNTER — Telehealth: Payer: Self-pay

## 2021-06-11 DIAGNOSIS — J329 Chronic sinusitis, unspecified: Secondary | ICD-10-CM

## 2021-06-11 NOTE — Telephone Encounter (Signed)
Patient calls nurse line requesting to speak with Dr. Leveda Anna regarding possible sinus infection. Reports headache, facial pain and nasal congestion.   Denies fever, body aches and sore throat.   Patient reports taking Zyrtec daily, as well as nasal spray. However, these medications have not been helping.   Patient does not want to schedule appointment at this time.   Please advise.   Veronda Prude, RN

## 2021-06-12 MED ORDER — AZITHROMYCIN 250 MG PO TABS: 250.0000 mg | ORAL_TABLET | Freq: Every day | ORAL | 0 refills | Status: DC

## 2021-06-12 NOTE — Telephone Encounter (Signed)
I tried to strike a balance between patient satisfaction and good medication.  She has three days of worsening cough and upper respiratory symptoms.  No fever.  Cough is longer than two days.  She is reluctant to come in worried that she may be exposed to COVID.  I expressed my concern about multiple over the phone antibiotics between her sinsusitis and her UTIs.  We agreed next time she thinks she needs antibiotics, she will come in to be seen.  Zpac sent.

## 2021-07-12 ENCOUNTER — Other Ambulatory Visit: Payer: Self-pay | Admitting: Gastroenterology

## 2021-07-12 ENCOUNTER — Other Ambulatory Visit: Payer: Self-pay | Admitting: Family Medicine

## 2021-07-12 DIAGNOSIS — I5032 Chronic diastolic (congestive) heart failure: Secondary | ICD-10-CM

## 2021-07-14 ENCOUNTER — Other Ambulatory Visit: Payer: Self-pay | Admitting: Gastroenterology

## 2021-07-17 ENCOUNTER — Other Ambulatory Visit: Payer: Self-pay

## 2021-07-17 ENCOUNTER — Encounter: Payer: Self-pay | Admitting: Gastroenterology

## 2021-07-17 ENCOUNTER — Encounter: Payer: Medicare Other | Admitting: Gastroenterology

## 2021-07-17 ENCOUNTER — Ambulatory Visit (INDEPENDENT_AMBULATORY_CARE_PROVIDER_SITE_OTHER): Payer: Medicare Other | Admitting: Gastroenterology

## 2021-07-17 VITALS — BP 130/80 | HR 86 | Ht 61.0 in | Wt 174.6 lb

## 2021-07-17 DIAGNOSIS — K5900 Constipation, unspecified: Secondary | ICD-10-CM

## 2021-07-17 DIAGNOSIS — R1310 Dysphagia, unspecified: Secondary | ICD-10-CM

## 2021-07-17 DIAGNOSIS — K219 Gastro-esophageal reflux disease without esophagitis: Secondary | ICD-10-CM

## 2021-07-17 DIAGNOSIS — Z791 Long term (current) use of non-steroidal anti-inflammatories (NSAID): Secondary | ICD-10-CM | POA: Diagnosis not present

## 2021-07-17 DIAGNOSIS — D649 Anemia, unspecified: Secondary | ICD-10-CM | POA: Diagnosis not present

## 2021-07-17 DIAGNOSIS — I6529 Occlusion and stenosis of unspecified carotid artery: Secondary | ICD-10-CM

## 2021-07-17 MED ORDER — PANTOPRAZOLE SODIUM 40 MG PO TBEC: 40.0000 mg | DELAYED_RELEASE_TABLET | Freq: Every day | ORAL | 3 refills | Status: DC
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
## 2021-07-17 NOTE — Patient Instructions (Signed)
If you are age 75 or older, your body mass index should be between 23-30. Your Body mass index is 32.99 kg/m. If this is out of the aforementioned range listed, please consider follow up with your Primary Care Provider.  If you are age 68 or younger, your body mass index should be between 19-25. Your Body mass index is 32.99 kg/m. If this is out of the aformentioned range listed, please consider follow up with your Primary Care Provider.   ________________________________________________________  The [facility] providers would like to encourage you to use [facility] to communicate with providers for non-urgent requests or questions.  Due to long hold times on the telephone, sending your provider a message by [facility] may be a faster and more efficient way to get a response.  Please allow 48 business hours for a response.  Please remember that this is for non-urgent requests.  _______________________________________________________  We have sent the following medications to your pharmacy for you to pick up at your convenience:  Pantoprazole  Stop iron Stop NSAIDS  Start Miralax daily

## 2021-07-18 ENCOUNTER — Encounter: Payer: Self-pay | Admitting: Gastroenterology

## 2021-07-26 ENCOUNTER — Ambulatory Visit: Payer: Medicare Other | Admitting: Family Medicine

## 2021-07-27 NOTE — Progress Notes (Signed)
This encounter was created in error - please disregard.

## 2021-07-31 ENCOUNTER — Other Ambulatory Visit: Payer: Self-pay

## 2021-07-31 ENCOUNTER — Encounter: Payer: Self-pay | Admitting: Gastroenterology

## 2021-07-31 ENCOUNTER — Ambulatory Visit (AMBULATORY_SURGERY_CENTER): Payer: Medicare Other | Admitting: Gastroenterology

## 2021-07-31 VITALS — BP 143/70 | HR 80 | Temp 96.6°F | Resp 14 | Ht 61.0 in | Wt 174.0 lb

## 2021-07-31 DIAGNOSIS — R1310 Dysphagia, unspecified: Secondary | ICD-10-CM | POA: Diagnosis not present

## 2021-07-31 DIAGNOSIS — K31819 Angiodysplasia of stomach and duodenum without bleeding: Secondary | ICD-10-CM | POA: Diagnosis not present

## 2021-07-31 DIAGNOSIS — K317 Polyp of stomach and duodenum: Secondary | ICD-10-CM | POA: Diagnosis not present

## 2021-07-31 DIAGNOSIS — D649 Anemia, unspecified: Secondary | ICD-10-CM | POA: Diagnosis not present

## 2021-07-31 DIAGNOSIS — K449 Diaphragmatic hernia without obstruction or gangrene: Secondary | ICD-10-CM | POA: Diagnosis not present

## 2021-07-31 DIAGNOSIS — K219 Gastro-esophageal reflux disease without esophagitis: Secondary | ICD-10-CM

## 2021-07-31 MED ORDER — SODIUM CHLORIDE 0.9 % IV SOLN: 500.0000 mL | Freq: Once | INTRAVENOUS | Status: DC

## 2021-07-31 MED ORDER — SUCRALFATE 1 GM/10ML PO SUSP: 1.0000 g | Freq: Four times a day (QID) | ORAL | 2 refills | Status: DC | PRN

## 2021-07-31 NOTE — Progress Notes (Signed)
History and Physical Interval Note: No changes since clinic visit 07/17/21 - here for EGD to evaluate GERD, dysphagia, anemia, history of ulcers - now on protonix 40mg  BID with some improvement. She wishes to proceed.  07/31/2021 9:22 AM  Shawndrea 14/01[birth year]2021  has presented today for endoscopic procedure(s), with the diagnosis of  Encounter Diagnoses  Name Primary?   Gastroesophageal reflux disease, unspecified whether esophagitis present Yes   Dysphagia, unspecified type    Anemia, unspecified type   .  The various methods of evaluation and treatment have been discussed with the patient and/or family. After consideration of risks, benefits and other options for treatment, the patient has consented to  the endoscopic procedure(s).   The patient's history has been reviewed, patient examined, no change in status, stable for surgery.  I have reviewed the patient's chart and labs.  Questions were answered to the patient's satisfaction.    Rueben Bash, MD [facility] Gastroenterology

## 2021-07-31 NOTE — Progress Notes (Signed)
Vss nad transferred to pacu 

## 2021-07-31 NOTE — Op Note (Signed)
[facility] Patient Name: Theresa Brennan Procedure Date: 07/31/2021 9:19 AM MRN: [id number] Endoscopist: Theresa Brennan , MD Age: 75 Referring MD:  Date of Birth: April 03, 1946 Gender: Female Account #: [id number] Procedure:                Upper GI endoscopy Indications:              Dysphagia, Follow-up of gastro-esophageal reflux                            disease, anemia with prior NSAID use - EGD to                            reassess and perform dilation to which she has                            responded to in the past. Now on protonix twice                            daily with some improvement since last visit                            (failed omeprazole / Dexilant in past) Medicines:                Monitored Anesthesia Care Procedure:                Pre-Anesthesia Assessment:                           - Prior to the procedure, a History and Physical                            was performed, and patient medications and                            allergies were reviewed. The patient's tolerance of                            previous anesthesia was also reviewed. The risks                            and benefits of the procedure and the sedation                            options and risks were discussed with the patient.                            All questions were answered, and informed consent                            was obtained. Prior Anticoagulants: The patient has                            taken no previous anticoagulant or antiplatelet  agents. ASA Grade Assessment: III - A patient with                            severe systemic disease. After reviewing the risks                            and benefits, the patient was deemed in                            satisfactory condition to undergo the procedure.                           After obtaining informed consent, the endoscope was                            passed under direct  vision. Throughout the                            procedure, the patient's blood pressure, pulse, and                            oxygen saturations were monitored continuously. The                            GIF [id number] was introduced through the                            mouth, and advanced to the second part of duodenum.                            The upper GI endoscopy was accomplished without                            difficulty. The patient tolerated the procedure                            well. Scope In: Scope Out: Findings:                 Esophagogastric landmarks were identified: the                            Z-line was found at 30 cm, the gastroesophageal                            junction was found at 30 cm and the upper extent of                            the gastric folds was found at 35 cm from the                            incisors.                           A  5 cm hiatal hernia was present. This appeared new                            since her last exam.                           The exam of the esophagus was otherwise normal.                           A guidewire was placed and the scope was withdrawn.                            Empiric dilation was performed in the entire                            esophagus with a Savary dilator with mild                            resistance at 17 mm and 18 mm. Relook endoscopy                            showed no mucosal wrents.                           A single linear erosion with no stigmata of recent                            bleeding was found in the distal gastric antrum at                            area of what appears to be scarring from suspected                            prior ulcer disease, at same location noted on                            prior endoscopy. No stigmata of bleeding noted.                            Biopsies taken.                           The exam of the stomach was otherwise normal.                            Biopsies were taken with a cold forceps in the                            gastric body, at the incisura and in the gastric                            antrum for Helicobacter pylori testing.  The duodenal bulb and second portion of the                            duodenum were normal. Complications:            No immediate complications. Estimated blood loss:                            Minimal. Estimated Blood Loss:     Estimated blood loss was minimal. Impression:               - Esophagogastric landmarks identified.                           - 5 cm hiatal hernia which appears new since her                            last exam and could be cause of worsening reflux.                           - Normal esophagus - no erosive changes or                            inflammatory changes otherwise. Empiric dilation                            performed to 23mm given benefit with this on the                            last exam.                           - Linear erosion in distal antrum with no stigmata                            of recent bleeding. Patient has history of PUD and                            NSAID use                           - Normal stomach otherwise - biopsies taken to rule                            out H pylori                           - Normal duodenal bulb and second portion of the                            duodenum. Recommendation:           - Patient has a contact number available for                            emergencies. The signs and symptoms of potential  delayed complications were discussed with the                            patient. Return to normal activities tomorrow.                            Written discharge instructions were provided to the                            patient.                           - Resume previous diet.                           - Continue present  medications.                           - Await pathology results and course on higher dose                            protonix. No erosive changes, suspect patient is                            having nonacid reflux in the setting of hiatal                            hernia. Will discuss options with her, can try                            adding liquid carafate PRN to see if that helps,                            consideration for surgical evaluation for hiatal                            hernia repair if symptoms significant. Theresa Lipps P. Theresa Posa, MD 07/31/2021 9:48:07 AM This report has been signed electronically.

## 2021-07-31 NOTE — Progress Notes (Signed)
Vitals-SH  Pt's states no medical or surgical changes since previsit or office visit. 

## 2021-07-31 NOTE — Progress Notes (Signed)
Called to room to assist during endoscopic procedure.  Patient ID and intended procedure confirmed with present staff. Received instructions for my participation in the procedure from the performing physician.  

## 2021-07-31 NOTE — Patient Instructions (Signed)
Handout on hernia given to patient. Follow post dilation diet today. Await pathology results.   YOU HAD AN ENDOSCOPIC PROCEDURE TODAY AT THE [facility]:   Refer to the procedure report that was given to you for any specific questions about what was found during the examination.  If the procedure report does not answer your questions, please call your gastroenterologist to clarify.  If you requested that your care partner not be given the details of your procedure findings, then the procedure report has been included in a sealed envelope for you to review at your convenience later.  YOU SHOULD EXPECT: Some feelings of bloating in the abdomen. Passage of more gas than usual.  Walking can help get rid of the air that was put into your GI tract during the procedure and reduce the bloating. If you had a lower endoscopy (such as a colonoscopy or flexible sigmoidoscopy) you may notice spotting of blood in your stool or on the toilet paper. If you underwent a bowel prep for your procedure, you may not have a normal bowel movement for a few days.  Please Note:  You might notice some irritation and congestion in your nose or some drainage.  This is from the oxygen used during your procedure.  There is no need for concern and it should clear up in a day or so.  SYMPTOMS TO REPORT IMMEDIATELY:  Following upper endoscopy (EGD)  Vomiting of blood or coffee ground material  New chest pain or pain under the shoulder blades  Painful or persistently difficult swallowing  New shortness of breath  Fever of 100F or higher  Black, tarry-looking stools  For urgent or emergent issues, a gastroenterologist can be reached at any hour by calling [phone]. Do not use MyChart messaging for urgent concerns.    DIET:  We do recommend a small meal at first, but then you may proceed to your regular diet.  Drink plenty of fluids but you should avoid alcoholic beverages for 24 hours.  ACTIVITY:  You  should plan to take it easy for the rest of today and you should NOT DRIVE or use heavy machinery until tomorrow (because of the sedation medicines used during the test).    FOLLOW UP: Our staff will call the number listed on your records 48-72 hours following your procedure to check on you and address any questions or concerns that you may have regarding the information given to you following your procedure. If we do not reach you, we will leave a message.  We will attempt to reach you two times.  During this call, we will ask if you have developed any symptoms of COVID 19. If you develop any symptoms (ie: fever, flu-like symptoms, shortness of breath, cough etc.) before then, please call [phone].  If you test positive for Covid 19 in the 2 weeks post procedure, please call and report this information to [place].    If any biopsies were taken you will be contacted by phone or by letter within the next 1-3 weeks.  Please call us at [phone] if you have not heard about the biopsies in 3 weeks.    SIGNATURES/CONFIDENTIALITY: You and/or your care partner have signed paperwork which will be entered into your electronic medical record.  These signatures attest to the fact that that the information above on your After Visit Summary has been reviewed and is understood.  Full responsibility of the confidentiality of this discharge information lies with you and/or your  care-partner.  

## 2021-08-02 ENCOUNTER — Telehealth: Payer: Self-pay | Admitting: *Deleted

## 2021-08-02 NOTE — Telephone Encounter (Signed)
  Follow up Call-  Call back number 07/31/2021 03/23/2020  Post procedure Call Back phone  # [phone]  Permission to leave phone message Yes Yes  Some recent data might be hidden     Patient questions:  Do you have a fever, pain , or abdominal swelling? No. Pain Score 3  Have you tolerated food without any problems? Yes.    Have you been able to return to your normal activities? Yes.    Do you have any questions about your discharge instructions: Diet   No. Medications  No. Follow up visit  No.  Do you have questions or concerns about your Care? No.  Actions: * If pain score is 4 or above: No action needed, pain <4.

## 2021-08-24 ENCOUNTER — Telehealth: Payer: Self-pay | Admitting: Gastroenterology

## 2021-08-24 NOTE — Telephone Encounter (Signed)
It looks like patient has also already been on Omeprazole and Dexilant, which she had break through GERD symptoms. Please adviosed what else she can try.

## 2021-08-24 NOTE — Telephone Encounter (Signed)
Patient called requesting a change in medication.  She has been taking pantoprazole, but she says it is giving her migraines and it needs to be changed to something else.  She would like the new medication called into [facility] on [address] in [place] today, if possible.  Please call patient if there is any issue with this.  Thank you.

## 2021-08-25 NOTE — Telephone Encounter (Signed)
I was out of the office all week and just seeing this message now.   If the protonix is causing side effects she can stop it. We can try nexium 40mg  BID if she hasn't tried that yet. I had also put her on liquid carafate, not sure if that is helping her either. Tough situation, she has a large hiatal hernia, if medications are not helping to control her symptoms may need to consider repairing the hernia. May be good to have her follow up with me in the office to discuss options otherwise if you can help coordinate. Thanks

## 2021-08-28 NOTE — Telephone Encounter (Signed)
Patient has returned phone call and I discussed trying Nexium since Pantoprazole gives her a headache. She advised that the Nexium also gives her a headache. She said that the Carafate helps but she has only gotten it filled once due to it costing $75. I have scheduled her to follow up on 10/03/2021 at 11:0 am. She is trying to avoid having hernia repair.

## 2021-08-28 NOTE — Telephone Encounter (Signed)
Left voicemail for patient to return phone call 

## 2021-08-28 NOTE — Telephone Encounter (Signed)
Okay. Well, up to her in regards to which PPIs she wants to try. Other option would be aciphex 40mg  / day if you want to try that, or prevacid 30mg  / day. She can also try pepcid 20mg  BID OTC. Thanks

## 2021-09-04 MED ORDER — RABEPRAZOLE SODIUM 20 MG PO TBEC: 20.0000 mg | DELAYED_RELEASE_TABLET | Freq: Every day | ORAL | 0 refills | Status: DC

## 2021-09-04 NOTE — Telephone Encounter (Signed)
Called patient to discuss options of Aciphex or Prevacid. She was on Prevacid for years which worked and she had no issues with. She is willing to try Aciphex. I advised I would send a 30 day supply to her pharmacy.

## 2021-09-04 NOTE — Telephone Encounter (Signed)
Left voicemail to return phone call.

## 2021-09-05 ENCOUNTER — Other Ambulatory Visit: Payer: Self-pay | Admitting: Family Medicine

## 2021-09-12 ENCOUNTER — Other Ambulatory Visit: Payer: Self-pay | Admitting: Family Medicine

## 2021-09-12 DIAGNOSIS — Z1231 Encounter for screening mammogram for malignant neoplasm of breast: Secondary | ICD-10-CM

## 2021-09-21 ENCOUNTER — Telehealth: Payer: Self-pay

## 2021-09-21 DIAGNOSIS — G43009 Migraine without aura, not intractable, without status migrainosus: Secondary | ICD-10-CM

## 2021-09-21 DIAGNOSIS — J329 Chronic sinusitis, unspecified: Secondary | ICD-10-CM

## 2021-09-21 MED ORDER — DOXYCYCLINE HYCLATE 100 MG PO TABS: 100.0000 mg | ORAL_TABLET | Freq: Two times a day (BID) | ORAL | 0 refills | Status: DC

## 2021-09-21 MED ORDER — SUMATRIPTAN SUCCINATE 100 MG PO TABS: ORAL_TABLET | ORAL | 2 refills | Status: DC

## 2021-09-21 NOTE — Telephone Encounter (Signed)
Called: Confirmed duration 14 days, which makes antibiotic use reasonable.  Pen/amox allergic.  Per up to date, doxy is good choice.  Rx sent.  Also refilled immitrex.  She will make an appointment.

## 2021-09-21 NOTE — Telephone Encounter (Signed)
Patient calls nurse line reporting a sinus infection. Patient reports symptom onset ~ 2 weeks ago and has gotten worse. Patient reports headaches, bilateral ear pain and facial pain. Patient states, "my whole face hurts." Patient denies fever/chill, body aches, cough or congestion. Patient dies report nausea, however is contributing to her headaches.   Patient is requesting an antibiotic. Patient declines an apt.   Will forward to PCP.

## 2021-10-03 ENCOUNTER — Ambulatory Visit: Payer: Medicare Other | Admitting: Gastroenterology

## 2021-10-15 ENCOUNTER — Other Ambulatory Visit: Payer: Self-pay | Admitting: Family Medicine

## 2021-10-15 ENCOUNTER — Other Ambulatory Visit: Payer: Self-pay | Admitting: Gastroenterology

## 2021-10-25 ENCOUNTER — Telehealth: Payer: Self-pay | Admitting: Gastroenterology

## 2021-10-25 MED ORDER — RABEPRAZOLE SODIUM 20 MG PO TBEC: 20.0000 mg | DELAYED_RELEASE_TABLET | Freq: Every day | ORAL | 0 refills | Status: DC

## 2021-10-25 NOTE — Telephone Encounter (Signed)
Rx re-sent as requested. Patient informed. ?

## 2021-10-25 NOTE — Telephone Encounter (Signed)
Patient called and stated that [facility] and they did not have a proscription for her. Requesting that it be resent. Please advise.  ?

## 2021-11-05 ENCOUNTER — Encounter: Payer: Self-pay | Admitting: Family Medicine

## 2021-11-05 ENCOUNTER — Other Ambulatory Visit: Payer: Self-pay

## 2021-11-05 ENCOUNTER — Ambulatory Visit (INDEPENDENT_AMBULATORY_CARE_PROVIDER_SITE_OTHER): Payer: Medicare Other | Admitting: Family Medicine

## 2021-11-05 DIAGNOSIS — J329 Chronic sinusitis, unspecified: Secondary | ICD-10-CM

## 2021-11-05 DIAGNOSIS — H6091 Unspecified otitis externa, right ear: Secondary | ICD-10-CM | POA: Insufficient documentation

## 2021-11-05 DIAGNOSIS — G43009 Migraine without aura, not intractable, without status migrainosus: Secondary | ICD-10-CM

## 2021-11-05 DIAGNOSIS — H6061 Unspecified chronic otitis externa, right ear: Secondary | ICD-10-CM | POA: Diagnosis not present

## 2021-11-05 DIAGNOSIS — I10 Essential (primary) hypertension: Secondary | ICD-10-CM

## 2021-11-05 MED ORDER — NEOMYCIN-POLYMYXIN-HC 3.5-10000-1 OT SOLN
3.0000 [drp] | Freq: Four times a day (QID) | OTIC | 0 refills | Status: DC
Start: 2021-11-05 — End: 2022-09-20

## 2021-11-05 MED ORDER — PROPRANOLOL HCL ER 60 MG PO CP24: 60.0000 mg | ORAL_CAPSULE | Freq: Every day | ORAL | 1 refills | Status: DC

## 2021-11-05 NOTE — Assessment & Plan Note (Signed)
Add propranolol for migraine prophylaxis ?

## 2021-11-05 NOTE — Progress Notes (Signed)
? ? ?  SUBJECTIVE:  ? ?CHIEF COMPLAINT / HPI:  ? ?Sinus problems all winter.  Patient with known chronic sinusitis.  Had been followed by ENT until he retired.  Also remote hx of allergy testing.  "I am allergic to a lot of things."  We had fallen into a pattern of me prescribing antibiotics over the phone too often for my comfort.  Only one antibiotic Rx this winter 09/21/21.  Takes zyrtec and flonase reportedly without benefit. She does not recall that spring allergies are particularly problematic for her. ?Second issue is right ear pain.  She has had several surgeries on right ear and gets frequent pain.  Previous ENT often prescribed drops before he retired.   ?Finally, complains of daily headaches.  Taking immitrex on a daily basis(1/2 tab).  Not on a preventive med.   ? ? ? ?OBJECTIVE:  ? ?BP (!) 145/76   Pulse 75   Ht 5\' 1"  (1.549 m)   Wt 183 lb 6.4 oz (83.2 kg)   SpO2 98%   BMI 34.65 kg/m?   ?Left TM and canal normal. ?Right ear pain to manipulation of tragus.  Canal tortuous.  I can't be certain whether this is edema or chronic abnormal architecture. ?Throat coblestoned. ?Neck no nodes ?Lungs clear ?Neuro, motor, sensory, gait, cognition and affect all grossly normal. ? ?ASSESSMENT/PLAN:  ? ?Right otitis externa ?Will treat from otitis externa ? ?Sinusitis, chronic ?Explained that nasal obstruction is the typical cause of frequent sinus problems.  After discussing options, we agreed on allergy referral. ? ?HYPERTENSION, BENIGN ?BP up a little.  Add propranolol LA for both BP and migraine prophylaxis. ? ?Migraine without aura ?Add propranolol for migraine prophylaxis ?  ? ? ?[name], MD ?Start  ?

## 2021-11-05 NOTE — Assessment & Plan Note (Signed)
BP up a little.  Add propranolol LA for both BP and migraine prophylaxis. ?

## 2021-11-05 NOTE — Patient Instructions (Signed)
Someone should call about the allergy referral ?I sent in drops for your right ear ?I also sent in a new medication that I hope cuts down on the frequency of the headaches. ?See me in one month and we will talk more about the headaches.   ?

## 2021-11-05 NOTE — Assessment & Plan Note (Signed)
Explained that nasal obstruction is the typical cause of frequent sinus problems.  After discussing options, we agreed on allergy referral. ?

## 2021-11-05 NOTE — Assessment & Plan Note (Signed)
Will treat from otitis externa ?

## 2021-11-06 ENCOUNTER — Other Ambulatory Visit: Payer: Self-pay | Admitting: Family Medicine

## 2021-11-06 DIAGNOSIS — G43009 Migraine without aura, not intractable, without status migrainosus: Secondary | ICD-10-CM

## 2021-11-08 ENCOUNTER — Ambulatory Visit: Payer: Medicare Other | Admitting: Gastroenterology

## 2021-11-08 DIAGNOSIS — R300 Dysuria: Secondary | ICD-10-CM | POA: Diagnosis not present

## 2021-11-08 DIAGNOSIS — N390 Urinary tract infection, site not specified: Secondary | ICD-10-CM | POA: Diagnosis not present

## 2021-11-08 NOTE — Progress Notes (Deleted)
? ?HPI :  ?76 year old female here for a follow-up visit.  She has a history of GERD, gastric ulcers, IBS. ?  ?Recall I performed an EGD for her on October 07, 2017 which did not show any obvious Barrett's esophagus or esophagitis.  She underwent empiric dilation to 18 mm without any obvious mucosal rents.  She states at the time it resolved her dysphagia however over time her symptoms have slowly recurred.  She has some intermittent dysphagia to solids that bother her.  She states she gets "choked" and had a few episodes where food would not go down that scared her.  She complained of this at the last time I saw her in June of last year.  I had recommended a barium swallow to see if we can help localize her dysphagia but she failed to follow through with that.  Her dysphagia has essentially persisted.  She does endorse that the dilation helped and inquires about that. ?  ?The main issue that is been bothering her has been her reflux.  She has been taking omeprazole 40 mg twice daily for some time now.  She had some breakthrough at the last visit and I tried switching her to [facility] and states it did not help too much and she felt better on omeprazole.  She states she had been doing pretty well in general in regards to control of her symptoms however over the past 6 months she has had worsening symptoms.  She feels frequent pyrosis and regurgitation at times despite taking her medications.  She ran out of her medications completely last week and symptoms got markedly worse.  She has a lot of burning and discomfort in her upper abdomen after eating.  She states if she bends over food may regurgitate and come out.  Last week she had a few episodes of vomiting but normally does not have that.  In regards to all the regimen she has been on, she states Nexium has given her headaches and Dexilant did not work as well as omeprazole.  She tried Pepcid for very long time ago. ?  ?I reviewed her chart and noted she became  anemic this past summer when she presented to the ED with lower extremity swelling.  She had a microcytic anemia and hemoglobin drifted to 9.  She denied any bleeding symptoms at that time.  She was empirically started on iron without checking iron studies, and her hemoglobin has improved to 11's with low normal ferritin recently.  She continues to take iron states it upsets her stomach and causes constipation.  She is straining a lot with her bowels.  She had a colonoscopy last year which looked okay.  No polyps.  Biopsies were taken for microscopic colitis for loose stools at the time and was negative.  She states her loose stools have clearly resolved and constipation is now the active issue.  She wants to come off the iron. ?  ?Upon further questioning while she denied NSAID use, she states she does take Aleve every night and has done so for some time.  She stopped it a few weeks ago when her stomach became upset.  She did not realize this was an NSAID.  She has a history of gastric ulcers.  Small ulcer noted on her last EGD. ?  ?Prior workup: ?EGD 10/07/2017 - normal esophagus, empiric dilation to 76mm, small pre-plyoric change ?  ?1. Surgical [P], gastric antrum ?- GASTRIC ANTRAL MUCOSA WITH NONSPECIFIC REACTIVE GASTROPATHY. ?- NEGATIVE  FOR INTESTINAL METAPLASIA, DYSPLASIA OR MALIGNANCY. ?2. Surgical [P], gastric antrum and gastric body ?- GASTRIC OXYNTIC MUCOSA WITH NO SPECIFIC HISTOPATHOLOGIC CHANGES. ?- WARTHIN-STARRY STAIN IS NEGATIVE FOR HELICOBACTER PYLORI. ?  ?  ?CT scan 03/22/19 - IMPRESSION: ?No evidence of bowel obstruction. ?Status post cholecystectomy, appendectomy, and hysterectomy. ?No CT findings to account for the patient's abdominal/back pain. ?  ?  ?Colonoscopy 03/23/20 - The perianal and digital rectal examinations were normal. ?- The terminal ileum appeared normal. ?- Internal hemorrhoids were found during retroflexion. ?- The exam was otherwise without abnormality. No overt inflammatory  changes, no polyps. ?- Biopsies for histology were taken with a cold forceps from the right colon, left colon and ?transverse colon for evaluation of microscopic colitis. ?  ?Surgical [P], colon, random sites ?- BENIGN COLONIC MUCOSA. ?- NO ACTIVE INFLAMMATION OR EVIDENCE OF MICROSCOPIC COLITIS. ?- NO DYSPLASIA OR MALIGNANCY. ?  ?Echo 03/14/21 - EF 55-60%, grade II DD ? ? ?76 year old female here for reassessment of following: ?  ?GERD ?Dysphagia ?Anemia ?NSAID use ?Constipation ?  ?Longstanding history of reflux that had been controlled with high-dose omeprazole however over the past 6 months with worsening symptoms and ongoing dysphagia.  She has had relief of her dysphagia with empiric dilation in the past although was not long-lasting.  We discussed options.  She has had microcytic anemia develop over the summer in the setting of NSAID use, she has a history of peptic ulcer and I suspect that might be related.  Her iron studies were not checked at the time and empirically started on iron, more recent CBC shows improvement of anemia ferritin low normal.  She had a small ulcer in her stomach at the last exam.  Given her anemia and recent worsening of symptoms with benefit with empiric dilation in the past regarding her dysphagia, I offered her an EGD to further evaluate the symptoms.  I discussed risks and benefits of endoscopy and anesthesia and she wanted to proceed.  In the interim I told her to avoid all NSAIDs, can use Tylenol for her joint pains.  We will switch her from omeprazole to Protonix 40 mg twice daily which she has not tried before and see if that provides any additional benefit.  We will trend her hemoglobin moving forward.  Her constipation may likely be due to iron supplementation, she really is not tolerating the iron too well and I think okay to stop given her anemia has resolved, as long as she avoids NSAIDs and continues PPI.  She can use MiraLAX as needed for constipation in the interim. ?   ?Plan: ?- EGD with empiric dilation ?- stop omeprazole ?- start protonix 40mg  BID - 1 month supply with 3 RF] ?- stop iron - not tolerating it ?- stop all NSAIDs ?- start Miralax daily - titrate as needed ?  ? ?EGD 07/31/21:Dysphagia, Follow-up of gastro-esophageal reflux disease, anemia with prior NSAID use - EGD ?to reassess and perform dilation to which she has responded to in the past. Now on protonix ?twice daily with some improvement since last visit (failed omeprazole / Dexilant in past) ? ?Esophagogastric landmarks were identified: the Z-line was found at 30 cm, the ?gastroesophageal junction was found at 30 cm and the upper extent of the gastric folds was ?found at 35 cm from the incisors. ?Findings: ?- A 5 cm hiatal hernia was present. This appeared new since her last exam. ?- The exam of the esophagus was otherwise normal. ?- A guidewire was placed  and the scope was withdrawn. Empiric dilation was performed in the ?entire esophagus with a Savary dilator with mild resistance at 17 mm and 18 mm. Relook ?endoscopy showed no mucosal wrents. ?- A single linear erosion with no stigmata of recent bleeding was found in the distal gastric ?antrum at area of what appears to be scarring from suspected prior ulcer disease, at same ?location noted on prior endoscopy. No stigmata of bleeding noted. Biopsies taken. ?- The exam of the stomach was otherwise normal. ?- Biopsies were taken with a cold forceps in the gastric body, at the incisura and in the gastric ?antrum for Helicobacter pylori testing. ?- The duodenal bulb and second portion of the duodenum were normal. ? ?Await pathology results and course on higher dose protonix. No erosive changes, ?suspect patient is having nonacid reflux in the setting of hiatal hernia. Will discuss options ?with her, can try adding liquid carafate PRN to see if that helps, consideration for surgical ?evaluation for hiatal hernia repair if symptoms significant. ? ?Diagnosis ?Surgical  [P], gastric biopsies ?BENIGN FUNDIC GLAND GASTRIC POLYP. ?ADDITIONAL FRAGMENTS OF GASTRIC MUCOSA WITH MINIMAL VASCULAR ECTASIA. ?H. PYLORI AND INTESTINAL METAPLASIA ARE NOT IDENTIFIED. ?NEGATIVE FOR DYSPL

## 2021-11-29 ENCOUNTER — Telehealth: Payer: Self-pay | Admitting: *Deleted

## 2021-11-29 DIAGNOSIS — J329 Chronic sinusitis, unspecified: Secondary | ICD-10-CM

## 2021-11-29 MED ORDER — CEFIXIME 400 MG PO CAPS: 400.0000 mg | ORAL_CAPSULE | Freq: Every day | ORAL | 0 refills | Status: DC

## 2021-11-29 NOTE — Telephone Encounter (Signed)
Pt calls to report that her sinus infection is not better and now she thinks she has a bladder infection ( burning with urination).  She wants to know if something can be called in to treat both issues. Jone Baseman, CMA ? ?

## 2021-11-29 NOTE — Chronic Care Management (AMB) (Signed)
?  Care Management  ? ?Note ? ?11/29/2021 ?Name: Theresa Brennan MRN: [id number] DOB: July 08, 1946 ? ?[name] is a 76 y.o. year old female who is a primary care patient of Hensel, Santiago Bumpers, MD. I reached out to [name] by phone today offer care coordination services.  ? ?Ms. Fullam was given information about care management services today including:  ?Care management services include personalized support from designated clinical staff supervised by her physician, including individualized plan of care and coordination with other care providers ?24/7 contact phone numbers for assistance for urgent and routine care needs. ?The patient may stop care management services at any time by phone call to the office staff. ? ?Patient agreed to services and verbal consent obtained.  ? ?Follow up plan: ?Telephone appointment with care management team member scheduled for:12/05/21 ? ?Gwenevere Ghazi  ?Care Guide, Embedded Care Coordination ?[facility]  Care Management  ?Direct Dial: [phone] ? ?

## 2021-11-29 NOTE — Telephone Encounter (Signed)
Feels continued sinus infection and now UTI sx.  She has an allergy appointment at the beginning of May.  Reviewed sinsusitis treatment in pen allergic patient (she has taken cephalosporins without problem)  Will rx with cefixime which should also cover the UTI. ?

## 2021-12-05 ENCOUNTER — Ambulatory Visit: Payer: Medicare Other

## 2021-12-05 NOTE — Patient Instructions (Signed)
?[name]  it was nice speaking with you. Please call me directly [phone] if you have questions about the goals we discussed. ? ? ? ?Patient Care Plan: RN Nurse Case Manager  ?  ? ?Problem Identified: General Plan of Care for patient with Congestive Hearrt Failure   ?  ? ?Goal: The patient will monitor andx prevent CHF exacerbation   ?Start Date: 12/05/2021  ?Expected End Date: 03/25/2022  ?Priority: High  ?Note:   ?Current Barriers:  ?Knowledge Deficits related to plan of care for management of CHF  ? ?RNCM Clinical Goal(s):  ?Patient will verbalize understanding of plan for management of CHF as evidenced by weighing daily and reducing , monitoring her diet and taking her medications  through collaboration with RN Care manager, provider, and care team.  ? ?Interventions: ?1:1 collaboration with primary care provider regarding development and update of comprehensive plan of care as evidenced by provider attestation and co-signature ?Inter-disciplinary care team collaboration (see longitudinal plan of care) ?Evaluation of current treatment plan related to  self management and patient's adherence to plan as established by provider ? ? ?Heart Failure Interventions:  (Status: New goal.)  Long Term Goal  ?Wt Readings from Last 3 Encounters:  ?11/05/21 183 lb 6.4 oz (83.2 kg)  ?07/31/21 174 lb (78.9 kg)  ?07/17/21 174 lb 9.6 oz (79.2 kg)  ? ?Basic overview and discussion of pathophysiology of Heart Failure reviewed ?Provided education on low sodium diet ?Advised patient to weigh each morning after emptying bladder ?Discussed importance of daily weight and advised patient to weigh and record daily ?Reviewed role of diuretics in prevention of fluid overload and management of heart failure ?Discussed the importance of keeping all appointments with provider ?12/05/21: I contacted Theresa Brennan by telephone for an initial visit; I spoke with Theresa Brennan, her grandaughter; she is on the ROI and the patient in response to the  referral. Theresa Brennan informed me that she lives in the home alone and can do her Adls and Iadls, but it takes her some time due to back and hip pain. I completed a review of his medical history, allergies, medications, falls, and health status and inquired about SDOH; no needs were identified. She discussed her feet swelling, and she did not understand why. We talked about diet, sodium intake, and elevating her feet, and she said she does it all except for wearing the compression stocking; they hurt her feet. She denies any chest pain or shortness of breath.  ?RNCM will send her education about CHF ?She also asked if I would message Dr. Leveda AnnaHensel about her propranolol prescription. ? ?Patient Goals/Self Care Activities: ?-Patient/Caregiver will self-administer medications as prescribed as evidenced by self-report/primary caregiver report  ?-Patient/Caregiver will attend all scheduled provider appointments as evidenced by clinician review of documented attendance to scheduled appointments and patient/caregiver report ?-Patient/Caregiver will call pharmacy for medication refills as evidenced by patient report and review of pharmacy fill history as appropriate ?-Patient/Caregiver will call provider office for new concerns or questions as evidenced by review of documented incoming telephone call notes and patient report ?-Patient/Caregiver verbalizes understanding of plan ?-Patient/Caregiver will focus on medication adherence by taking medications as prescribed ?-Weigh daily and record (notify MD with 3 lb weight gain over night or 5 lb in a week) ?-Follow CHF Action Plan ?-Adhere to low sodium diet ? ?  ?  ? ?Theresa Brennan received Care Management services today:  ?Care Management services include personalized support from designated clinical staff supervised by her physician, including individualized  plan of care and coordination with other care providers ?24/7 contact [phone] for assistance for urgent and routine  care needs. ?Care Management are voluntary services and be declined at any time by calling the office. ? ?The patient verbalized understanding of instructions provided today and agreed to receive a mailed copy of patient instruction and/or educational materials.  ? ?Follow Up Plan: Patient would like continued follow-up.  CCM RNCM will outreach the patient within the next 2 weeks  Patient will call office if needed prior to next encounter ? ? ?[name], RN  ? ?[phone]  ?

## 2021-12-05 NOTE — Chronic Care Management (AMB) (Signed)
? Care Management ?  ? RN Visit Note ? ?12/05/2021 ?Name: Theresa Brennan MRN: [id number] DOB: 26-Nov-1945 ? ?Subjective: ?Theresa Brennan is a 76 y.o. year old female who is a primary care patient of Hensel, Jamal Collin, MD. The care management team was consulted for assistance with disease management and care coordination needs.   ? ?Engaged with patient by telephone for initial visit in response to provider referral for case management and/or care coordination services.  ? ?Consent to Services:  ? Theresa Brennan was given information about Care Management services today including:  ?Care Management services includes personalized support from designated clinical staff supervised by her physician, including individualized plan of care and coordination with other care providers ?24/7 contact phone numbers for assistance for urgent and routine care needs. ?The patient may stop case management services at any time by phone call to the office staff. ? ?Patient agreed to services and consent obtained.  ? ? ?Summary: Patient is making progress with her chronic conditions , but currently is experiencing difficulty with swelling in her feet . See Care Plan below for interventions and patient self-care actives. ? ?Recommendation: The patient may benefit from taking medications as prescribed, monitoring food intake, watch fluid intake, weighing Daily and record values, checking blood pressures and record values, calling your physician if numbers are abnormal, and The patient agrees. ? ?Follow up Plan: Patient would like continued follow-up.  CCM RNCM will outreach the patient within the next 2 weeks.  Patient will call office if needed prior to next encounter ? ? ?Assessment: Review of patient past medical history, allergies, medications, health status, including review of consultants reports, laboratory and other test data, was performed as part of comprehensive evaluation and provision of chronic care management services.  ? ?SDOH  (Social Determinants of Health) assessments and interventions performed:  No ? ?Care Plan ?Conditions to be addressed/monitored: CHF ? ?Care Plan : RN Nurse Case Manager  ?Updates made by Lazaro Arms, RN since 12/05/2021 12:00 AM  ?  ? ?Problem: General Plan of Care for patient with Congestive Hearrt Failure   ?  ? ?Goal: Coping Skills Enhanced   ?Start Date: 12/05/2021  ?Expected End Date: 03/25/2022  ?Priority: High  ?Note:   ?Current Barriers:  ?Knowledge Deficits related to plan of care for management of CHF  ? ?RNCM Clinical Goal(s):  ?Patient will verbalize understanding of plan for management of CHF as evidenced by weighing daily and reducing , monitoring her diet and taking her medications  through collaboration with RN Care manager, provider, and care team.  ? ?Interventions: ?1:1 collaboration with primary care provider regarding development and update of comprehensive plan of care as evidenced by provider attestation and co-signature ?Inter-disciplinary care team collaboration (see longitudinal plan of care) ?Evaluation of current treatment plan related to  self management and patient's adherence to plan as established by provider ? ? ?Heart Failure Interventions:  (Status: New goal.)  Long Term Goal  ?Wt Readings from Last 3 Encounters:  ?11/05/21 183 lb 6.4 oz (83.2 kg)  ?07/31/21 174 lb (78.9 kg)  ?07/17/21 174 lb 9.6 oz (79.2 kg)  ? ?Basic overview and discussion of pathophysiology of Heart Failure reviewed ?Provided education on low sodium diet ?Advised patient to weigh each morning after emptying bladder ?Discussed importance of daily weight and advised patient to weigh and record daily ?Reviewed role of diuretics in prevention of fluid overload and management of heart failure ?Discussed the importance of keeping all appointments with provider ?12/05/21:  I contacted TheresaBrennan by telephone for an initial visit; I spoke with Theresa Brennan, her grandaughter; she is on the ROI and the patient in response to  the referral. [name] informed me that she lives in the home alone and can do her Adls and Iadls, but it takes her some time due to back and hip pain. I completed a review of his medical history, allergies, medications, falls, and health status and inquired about SDOH; no needs were identified. She discussed her feet swelling, and she did not understand why. We talked about diet, sodium intake, and elevating her feet, and she said she does it all except for wearing the compression stocking; they hurt her feet. She denies any chest pain or shortness of breath.  ?RNCM will send her education about CHF ?She also asked if I would message Dr. Andria Frames about her propranolol prescription. ? ?Patient Goals/Self Care Activities: ?-Patient/Caregiver will self-administer medications as prescribed as evidenced by self-report/primary caregiver report  ?-Patient/Caregiver will attend all scheduled provider appointments as evidenced by clinician review of documented attendance to scheduled appointments and patient/caregiver report ?-Patient/Caregiver will call pharmacy for medication refills as evidenced by patient report and review of pharmacy fill history as appropriate ?-Patient/Caregiver will call provider office for new concerns or questions as evidenced by review of documented incoming telephone call notes and patient report ?-Patient/Caregiver verbalizes understanding of plan ?-Patient/Caregiver will focus on medication adherence by taking medications as prescribed ?-Weigh daily and record (notify MD with 3 lb weight gain over night or 5 lb in a week) ?-Follow CHF Action Plan ?-Adhere to low sodium diet ? ?  ? ? ? ?Lazaro Arms RN, BSN, [place] ?Care Management Coordinator ?[place]  ?Phone: [phone]  ?  ? ? ? ? ? ? ? ? ?

## 2021-12-06 ENCOUNTER — Other Ambulatory Visit: Payer: Self-pay | Admitting: Family Medicine

## 2021-12-06 NOTE — Progress Notes (Signed)
Apparently not taking propranolol because it made her feel weird. ?

## 2021-12-17 ENCOUNTER — Ambulatory Visit: Payer: Medicare Other | Admitting: Internal Medicine
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
## 2021-12-19 ENCOUNTER — Ambulatory Visit: Payer: Medicare Other

## 2021-12-19 NOTE — Chronic Care Management (AMB) (Signed)
?Chronic Care Management  ? ?CCM RN Visit Note ? ?12/19/2021 ?Name: KIAH CAMPOLI MRN: [id number] DOB: 1946/07/28 ? ?Subjective: ?Theresa Brennan is a 76 y.o. year old female who is a primary care patient of Hensel, Santiago Bumpers, MD. The care management team was consulted for assistance with disease management and care coordination needs.   ? ?Engaged with patient by telephone for follow up visit in response to provider referral for case management and/or care coordination services.  ? ?Consent to Services:  ?The patient was given information about Chronic Care Management services, agreed to services, and gave verbal consent prior to initiation of services.  Please see initial visit note for detailed documentation.  ? ?Patient agreed to services and verbal consent obtained.  ? ? ?Summary: Patient is making progress with CHF , but currently is experiencing difficulty with sinus headaches and  right side pain.  I advised the patient to call and make an appointment to be checked.  She verbalized understanding.. See Care Plan below for interventions and patient self-care actives. ? ?Recommendation: The patient may benefit from taking medications as prescribed, monitoring food intake, watch fluid intake, weighing Daily and record values, calling your physician if numbers are abnormal, and The patient agrees. ? ?Follow up Plan: Patient would like continued follow-up.  CCM RNCM will outreach the patient within the next 5 weeks  Patient will call office if needed prior to next encounter ? ? ?Assessment: Review of patient past medical history, allergies, medications, health status, including review of consultants reports, laboratory and other test data, was performed as part of comprehensive evaluation and provision of chronic care management services.  ? ?SDOH (Social Determinants of Health) assessments and interventions performed:   ? ?Conditions to be addressed/monitored:CHF ? ?Care Plan : RN Nurse Case Manager  ?Updates made by  [name], RN since 12/19/2021 12:00 AM  ?  ? ?Problem: General Plan of Care for patient with Congestive Hearrt Failure   ?  ? ?Goal: The patient will monitor and prevent CHF exacerbation   ?Start Date: 12/05/2021  ?Expected End Date: 03/25/2022  ?Priority: High  ?Note:   ?Current Barriers:  ?Knowledge Deficits related to plan of care for management of CHF  ? ?RNCM Clinical Goal(s):  ?Patient will verbalize understanding of plan for management of CHF as evidenced by weighing daily and reducing , monitoring her diet and taking her medications  through collaboration with RN Care manager, provider, and care team.  ? ?Interventions: ?1:1 collaboration with primary care provider regarding development and update of comprehensive plan of care as evidenced by provider attestation and co-signature ?Inter-disciplinary care team collaboration (see longitudinal plan of care) ?Evaluation of current treatment plan related to  self management and patient's adherence to plan as established by provider ? ? ?Heart Failure Interventions:  (Status: New goal.)  Long Term Goal  ?Wt Readings from Last 3 Encounters:  ?11/05/21 183 lb 6.4 oz (83.2 kg)  ?07/31/21 174 lb (78.9 kg)  ?07/17/21 174 lb 9.6 oz (79.2 kg)  ? ?Basic overview and discussion of pathophysiology of Heart Failure reviewed ?Provided education on low sodium diet ?Advised patient to weigh each morning after emptying bladder ?Discussed importance of daily weight and advised patient to weigh and record daily ?Reviewed role of diuretics in prevention of fluid overload and management of heart failure ?Discussed the importance of keeping all appointments with provider ?12/19/21: The patient said that she was not feeling well today. She woke up with a headache and thought she  had a sinus infection, and her right side was hurting. She feels it may be a UTI. I advised the patient to call the office for an appointment to be seen. She verbalized understanding. The patient did not weigh  today but weighed yesterday, and it was 183 lbs. She denies any chest pain or shortness of breath but had some leg swelling that she has addressed with the doctor before. She received the information I sent her about CHF, and we reviewed the HF action plan, diet, medication, weighing daily and when to weigh, and the importance. ? ? ?Patient Goals/Self Care Activities: ?-Patient/Caregiver will self-administer medications as prescribed as evidenced by self-report/primary caregiver report  ?-Patient/Caregiver will attend all scheduled provider appointments as evidenced by clinician review of documented attendance to scheduled appointments and patient/caregiver report ?-Patient/Caregiver will call pharmacy for medication refills as evidenced by patient report and review of pharmacy fill history as appropriate ?-Patient/Caregiver will call provider office for new concerns or questions as evidenced by review of documented incoming telephone call notes and patient report ?-Patient/Caregiver verbalizes understanding of plan ?-Patient/Caregiver will focus on medication adherence by taking medications as prescribed ?-Weigh daily and record (notify MD with 3 lb weight gain over night or 5 lb in a week) ?-Follow CHF Action Plan ?-Adhere to low sodium diet ? ?  ? ?[name] RN, BSN, CPC ?Care Management Coordinator ?[facility]  ?Phone: [phone]  ?  ? ? ? ? ? ? ? ?

## 2021-12-19 NOTE — Patient Instructions (Signed)
Visit Information ? ?[name]  it was nice speaking with you. Please call me directly [phone] if you have questions about the goals we discussed. ? ?  ?Patient Goals/Self Care Activities: ?-Patient/Caregiver will self-administer medications as prescribed as evidenced by self-report/primary caregiver report  ?-Patient/Caregiver will attend all scheduled provider appointments as evidenced by clinician review of documented attendance to scheduled appointments and patient/caregiver report ?-Patient/Caregiver will call pharmacy for medication refills as evidenced by patient report and review of pharmacy fill history as appropriate ?-Patient/Caregiver will call provider office for new concerns or questions as evidenced by review of documented incoming telephone call notes and patient report ?-Patient/Caregiver verbalizes understanding of plan ?-Patient/Caregiver will focus on medication adherence by taking medications as prescribed ?-Weigh daily and record (notify MD with 3 lb weight gain over night or 5 lb in a week) ?-Follow CHF Action Plan ?-Adhere to low sodium diet ?  ? ? ?Patient verbalizes understanding of instructions and care plan provided today and agrees to view in MyChart. Active MyChart status confirmed with patient.   ? ?Follow up Plan: Patient would like continued follow-up.  CCM RNCM will outreach the patient within the next 5 weeks.  Patient will call office if needed prior to next encounter ? ?Theresa Fairlyraci Kelie Gainey, RN ? ?[phone]  ?

## 2021-12-21 ENCOUNTER — Other Ambulatory Visit: Payer: Self-pay | Admitting: Family Medicine

## 2021-12-21 DIAGNOSIS — I5032 Chronic diastolic (congestive) heart failure: Secondary | ICD-10-CM

## 2021-12-21 DIAGNOSIS — G43009 Migraine without aura, not intractable, without status migrainosus: Secondary | ICD-10-CM

## 2022-01-01 ENCOUNTER — Other Ambulatory Visit: Payer: Self-pay | Admitting: Gastroenterology

## 2022-01-18 ENCOUNTER — Ambulatory Visit: Payer: Medicare Other

## 2022-01-18 NOTE — Chronic Care Management (AMB) (Signed)
   RN Case Manager Care Management   Phone Outreach    01/18/2022 Name: Theresa Brennan MRN: [id number] DOB: [birth year]  [name] is a 76 y.o. year old female who is a primary care patient of Hensel, Santiago Bumpers, MD .   Unable to keep phone appointment today and requested to reschedule.  Follow Up Plan: Appointment was rescheduled with CM RN on  01/23/22 at 130 PM  Review of patient status, including review of consultants reports, relevant laboratory and other test results, and collaboration with appropriate care team members and the patient's provider was performed as part of comprehensive patient evaluation and provision of care management services.    [name] RN, BSN, [facility] Care Management Coordinator [facility] Phone: [phone] Fax: [phone]

## 2022-01-23 ENCOUNTER — Ambulatory Visit: Payer: Medicare Other

## 2022-01-23 NOTE — Chronic Care Management (AMB) (Signed)
Chronic Care Management   CCM RN Visit Note  01/23/2022 Name: Theresa Brennan MRN: [id number] DOB: 31-Jul-1946  Subjective: Theresa Brennan is a 76 y.o. year old female who is a primary care patient of Hensel, Santiago Bumpers, MD. The care management team was consulted for assistance with disease management and care coordination needs.    Engaged with patient by telephone for follow up visit in response to provider referral for case management and/or care coordination services.   Consent to Services:  The patient was given information about Chronic Care Management services, agreed to services, and gave verbal consent prior to initiation of services.  Please see initial visit note for detailed documentation.   Patient agreed to services and verbal consent obtained.    Summary:  The patient is making good efforts towards her chronic conditions but she is dealing with a sinus infection. . See Care Plan below for interventions and patient self-care actives.  Recommendation: The patient may benefit from taking medications as prescribed, weighing Daily and record values, and The patient agrees.  Follow up Plan: Patient would like continued follow-up.  CCM RNCM will outreach the patient within the next 5 weeks  Patient will call office if needed prior to next encounter   Assessment: Review of patient past medical history, allergies, medications, health status, including review of consultants reports, laboratory and other test data, was performed as part of comprehensive evaluation and provision of chronic care management services.   SDOH (Social Determinants of Health) assessments and interventions performed:  No  CCM Care Plan  Allergies  Allergen Reactions   Amoxicillin Swelling    SWELLING REACTION UNSPECIFIED  [Denies allergy but Significant Reactions to PCN's]   Meloxicam Nausea Only, Rash and Other (See Comments)    Rebound Headache   Metoprolol Other (See Comments)    Feet and leg pain/cramp  (Rayn[place]?)   Morphine Sulfate Nausea And Vomiting and Other (See Comments)    Severe Rebound Headache   Penicillins Swelling, Rash and Other (See Comments)    Blisters in mouth and red tongue PATIENT HAS HAD A PCN REACTION WITH IMMEDIATE RASH, FACIAL/TONGUE/THROAT SWELLING, SOB, OR LIGHTHEADEDNESS WITH HYPOTENSION:  #  #  YES  #  #  Has patient had a PCN reaction causing severe rash involving mucus membranes or skin necrosis: No Has patient had a PCN reaction that required hospitalization: No Has patient had a PCN reaction occurring within the last 10 years: No    Celebrex [Celecoxib] Other (See Comments)    Weight gain and fluid retention   Gabapentin Other (See Comments)    Headache and foot swelling   Naproxen     UNSPECIFIED REACTION    Tylenol Arthritis Ext [Acetaminophen] Other (See Comments)    Headache   (only tylenol arthritis)    Patient states she can take tylenol    Outpatient Encounter Medications as of 01/23/2022  Medication Sig   albuterol (PROVENTIL HFA;VENTOLIN HFA) 108 (90 Base) MCG/ACT inhaler Inhale 2 puffs into the lungs every 6 (six) hours as needed for wheezing or shortness of breath.   aspirin EC 325 MG tablet Take 1 tablet (325 mg total) by mouth daily. (Patient taking differently: Take 325 mg by mouth at bedtime.)   cefixime (SUPRAX) 400 MG CAPS capsule Take 1 capsule (400 mg total) by mouth daily.   cetirizine (ZYRTEC) 10 MG tablet Take 1 tablet (10 mg total) by mouth daily.   doxycycline (VIBRA-TABS) 100 MG tablet Take 1 tablet (100 mg  total) by mouth 2 (two) times daily. (Patient not taking: Reported on 12/05/2021)   estradiol (ESTRACE) 0.5 MG tablet Take 0.5 mg by mouth daily.    fluticasone (FLONASE) 50 MCG/ACT nasal spray Place 2 sprays into both nostrils daily.   furosemide (LASIX) 40 MG tablet Take 1 tablet by mouth twice daily   ipratropium (ATROVENT) 0.03 % nasal spray Place 2 sprays into both nostrils 2 (two) times daily. (Patient not taking: Reported  on 12/05/2021)   levothyroxine (SYNTHROID) 100 MCG tablet Take 1 tablet (100 mcg total) by mouth every morning. 30 minutes before food   metolazone (ZAROXOLYN) 2.5 MG tablet Take 1 tablet (2.5 mg total) by mouth every other day. Take 30 minutes prior to morning dose of lasix   Multiple Vitamins-Minerals (WOMENS MULTIVITAMIN PLUS) TABS Take 1 tablet by mouth at bedtime.    neomycin-polymyxin-hydrocortisone (CORTISPORIN) OTIC solution Place 3 drops into the right ear 4 (four) times daily.   potassium chloride SA (KLOR-CON) 20 MEQ tablet Take 1 tablet (20 mEq total) by mouth 2 (two) times daily. Please note new instructions.   RABEprazole (ACIPHEX) 20 MG tablet Take 1 tablet by mouth once daily   rosuvastatin (CRESTOR) 10 MG tablet Take 1 tablet by mouth once daily   sertraline (ZOLOFT) 50 MG tablet Take 1 tablet (50 mg total) by mouth daily.   sucralfate (CARAFATE) 1 GM/10ML suspension Take 10 mLs (1 g total) by mouth 4 (four) times daily as needed.   SUMAtriptan (IMITREX) 100 MG tablet TAKE 1 TABLET BY MOUTH ONCE DAILY AS DIRECTED FOR MIGRAINE HEADACHES. PATIENT NEEDS APPOINTMENT FOR FUTURE REFILLS.   traMADol (ULTRAM) 50 MG tablet TAKE 1 TABLET BY MOUTH EVERY 6 HOURS AS NEEDED FOR SEVERE PAIN   No facility-administered encounter medications on file as of 01/23/2022.    Patient Active Problem List   Diagnosis Date Noted   Right otitis externa 11/05/2021   History of recurrent urinary tract infection 02/21/2021   Anemia 01/30/2021   Primary osteoarthritis of left hip 05/18/2020   Sacroiliac dysfunction 05/18/2020   Depression 01/13/2020   Recurrent major depressive disorder (HCC) 01/13/2020   Tear of left rotator cuff 10/29/2019   Lightheadedness 02/19/2019   Cervical spine disease 10/29/2018   Grief 10/29/2018   HNP (herniated nucleus pulposus), lumbar 04/17/2018   Lower extremity pain, bilateral 12/10/2017   Estrogen deficiency 07/24/2017   Memory loss 11/06/2016   Chronic pain  syndrome 09/22/2015   Encounter for chronic pain management 09/22/2015   Chronic diastolic heart failure (HCC) 02/15/2015   Carotid artery stenosis 12/16/2014   Hyperlipidemia 12/07/2014   Subclinical hypothyroidism 12/07/2014   Chest pain 12/05/2014   Leg swelling 12/05/2014   Restless leg syndrome 08/29/2014   Lumbar back pain 10/09/2013   VAGINITIS, ATROPHIC 03/29/2010   MENOPAUSE-RELATED VASOMOTOR SYMPTOMS, HOT FLASHES 01/25/2010   DEGENERATIVE JOINT DISEASE, HIPS 01/25/2010   Migraine without aura 12/06/2008   ALLERGIC RHINITIS, SEASONAL 12/06/2008   HYPERTENSION, BENIGN 12/23/2006   Somatization disorder 10/23/2006   Sinusitis, chronic 10/23/2006   GASTROESOPHAGEAL REFLUX, NO ESOPHAGITIS 10/23/2006   IRRITABLE BOWEL SYNDROME 10/23/2006   Interstitial cystitis 10/23/2006   Osteoarthritis, multiple sites 10/23/2006   OSTEOPENIA 10/23/2006    Conditions to be addressed/monitored:CHF  Care Plan : RN Nurse Case Manager  Updates made by Juanell Fairlyoles, Allysa Governale, RN since 01/23/2022 12:00 AM     Problem: General Plan of Care for patient with Congestive Hearrt Failure      Goal: The patient will monitor and prevent CHF  exacerbation   Start Date: 12/05/2021  Expected End Date: 03/25/2022  Priority: High  Note:   Current Barriers:  Knowledge Deficits related to plan of care for management of CHF   RNCM Clinical Goal(s):  Patient will verbalize understanding of plan for management of CHF as evidenced by weighing daily and reducing , monitoring her diet and taking her medications  through collaboration with RN Care manager, provider, and care team.   Interventions: 1:1 collaboration with primary care provider regarding development and update of comprehensive plan of care as evidenced by provider attestation and co-signature Inter-disciplinary care team collaboration (see longitudinal plan of care) Evaluation of current treatment plan related to  self management and patient's adherence to  plan as established by provider   Heart Failure Interventions:  (Status: New goal.)  Long Term Goal  Wt Readings from Last 3 Encounters:  11/05/21 183 lb 6.4 oz (83.2 kg)  07/31/21 174 lb (78.9 kg)  07/17/21 174 lb 9.6 oz (79.2 kg)   Basic overview and discussion of pathophysiology of Heart Failure reviewed Provided education on low sodium diet Advised patient to weigh each morning after emptying bladder Discussed importance of daily weight and advised patient to weigh and record daily Reviewed role of diuretics in prevention of fluid overload and management of heart failure Discussed the importance of keeping all appointments with provider 01/23/22: During my conversation with Mrs. Schnepf, she shared that she was having a difficult day due to having to euthanize her dog. As a result, she was experiencing a sinus headache from crying. Mrs. Leaver reported no chest pain, shortness of breath, or swelling. She admitted to keeping her legs elevated since she hasn't moved around much. She also mentioned that she has been monitoring her blood pressure, which was 135/80 today.   Patient Goals/Self Care Activities: -Patient/Caregiver will self-administer medications as prescribed as evidenced by self-report/primary caregiver report  -Patient/Caregiver will attend all scheduled provider appointments as evidenced by clinician review of documented attendance to scheduled appointments and patient/caregiver report -Patient/Caregiver will call pharmacy for medication refills as evidenced by patient report and review of pharmacy fill history as appropriate -Patient/Caregiver will call provider office for new concerns or questions as evidenced by review of documented incoming telephone call notes and patient report -Patient/Caregiver verbalizes understanding of plan -Patient/Caregiver will focus on medication adherence by taking medications as prescribed -Weigh daily and record (notify MD with 3 lb weight  gain over night or 5 lb in a week) -Follow CHF Action Plan -Adhere to low sodium diet     [name] RN, BSN, [facility] Care Management Coordinator [facility]  Phone: [phone]

## 2022-01-23 NOTE — Patient Instructions (Signed)
Visit Information  Ms. Stoecklein  it was nice speaking with you. Please call me directly [phone] if you have questions about the goals we discussed.  Patient Goals/Self Care Activities: -Patient/Caregiver will self-administer medications as prescribed as evidenced by self-report/primary caregiver report  -Patient/Caregiver will attend all scheduled provider appointments as evidenced by clinician review of documented attendance to scheduled appointments and patient/caregiver report -Patient/Caregiver will call pharmacy for medication refills as evidenced by patient report and review of pharmacy fill history as appropriate -Patient/Caregiver will call provider office for new concerns or questions as evidenced by review of documented incoming telephone call notes and patient report -Patient/Caregiver verbalizes understanding of plan -Patient/Caregiver will focus on medication adherence by taking medications as prescribed -Weigh daily and record (notify MD with 3 lb weight gain over night or 5 lb in a week) -Follow CHF Action Plan -Adhere to low sodium diet     Patient verbalizes understanding of instructions and care plan provided today and agrees to view in MyChart. Active MyChart status and patient understanding of how to access instructions and care plan via MyChart confirmed with patient.     Follow up Plan: Patient would like continued follow-up.  CCM RNCM will outreach the patient within the next 5 weeks.  Patient will call office if needed prior to next encounter  [name], RN  [phone]

## 2022-01-29 ENCOUNTER — Encounter: Payer: Self-pay | Admitting: *Deleted

## 2022-02-04 DIAGNOSIS — M5116 Intervertebral disc disorders with radiculopathy, lumbar region: Secondary | ICD-10-CM | POA: Diagnosis not present

## 2022-02-04 DIAGNOSIS — Z79891 Long term (current) use of opiate analgesic: Secondary | ICD-10-CM | POA: Diagnosis not present

## 2022-02-04 DIAGNOSIS — M47816 Spondylosis without myelopathy or radiculopathy, lumbar region: Secondary | ICD-10-CM | POA: Diagnosis not present

## 2022-02-12 ENCOUNTER — Telehealth: Payer: Self-pay | Admitting: *Deleted

## 2022-02-12 NOTE — Chronic Care Management (AMB) (Signed)
  Care Coordination Note  02/12/2022 Name: Theresa Brennan MRN: [id number] DOB: [birth year]  Theresa Brennan is a 76 y.o. year old female who is a primary care patient of Hensel, Santiago Bumpers, MD and is actively engaged with the care management team. I reached out to Theresa Brennan by phone today to assist with re-scheduling a follow up visit with the RN Case Manager  Follow up plan: Unsuccessful telephone outreach attempt made. A HIPAA compliant phone message was left for the patient providing contact information and requesting a return call.  The care management team will reach out to the patient again over the next 7-10 days.  If patient returns call to provider office, please advise to call Embedded Care Management Care Guide Misty Stanley at [phone].  Gwenevere Ghazi  Care Guide, Embedded Care Coordination [facility] Management  Direct Dial: [phone]

## 2022-02-18 DIAGNOSIS — F330 Major depressive disorder, recurrent, mild: Secondary | ICD-10-CM | POA: Diagnosis not present

## 2022-02-18 DIAGNOSIS — E039 Hypothyroidism, unspecified: Secondary | ICD-10-CM | POA: Diagnosis not present

## 2022-02-18 DIAGNOSIS — I5030 Unspecified diastolic (congestive) heart failure: Secondary | ICD-10-CM | POA: Diagnosis not present

## 2022-02-18 DIAGNOSIS — E261 Secondary hyperaldosteronism: Secondary | ICD-10-CM | POA: Diagnosis not present

## 2022-02-18 DIAGNOSIS — I872 Venous insufficiency (chronic) (peripheral): Secondary | ICD-10-CM | POA: Diagnosis not present

## 2022-02-18 DIAGNOSIS — E785 Hyperlipidemia, unspecified: Secondary | ICD-10-CM | POA: Diagnosis not present

## 2022-02-18 DIAGNOSIS — I700 Atherosclerosis of aorta: Secondary | ICD-10-CM | POA: Diagnosis not present

## 2022-02-18 DIAGNOSIS — Z0000 Encounter for general adult medical examination without abnormal findings: Secondary | ICD-10-CM | POA: Diagnosis not present

## 2022-02-18 DIAGNOSIS — I6529 Occlusion and stenosis of unspecified carotid artery: Secondary | ICD-10-CM | POA: Diagnosis not present

## 2022-02-19 NOTE — Chronic Care Management (AMB) (Signed)
  Care Coordination Note  02/19/2022 Name: Theresa Brennan MRN: [id number] DOB: 05-Jan-1946  Theresa Brennan is a 76 y.o. year old female who is a primary care patient of Hensel, Santiago Bumpers, MD and is actively engaged with the care management team. I reached out to Theresa Brennan by phone today to assist with re-scheduling a follow up visit with the RN Case Manager  Follow up plan: A second unsuccessful telephone outreach attempt made. A HIPAA compliant phone message was left for the patient providing contact information and requesting a return call.  The care management team will reach out to the patient again over the next 7 days.  If patient returns call to provider office, please advise to call Embedded Care Management Care Guide Misty Stanley at [phone].  Gwenevere Ghazi  Care Guide, Embedded Care Coordination [facility] Management  Direct Dial: [phone]

## 2022-02-20 NOTE — Chronic Care Management (AMB) (Signed)
  Care Coordination Note  02/20/2022 Name: SIARA GORDER MRN: [id number] DOB: August 02, 1946  [name] is a 76 y.o. year old female who is a primary care patient of Hensel, Santiago Bumpers, MD and is actively engaged with the care management team. I reached out to [name] by phone today to assist with re-scheduling a follow up visit with the RN Case Manager  Follow up plan: A third unsuccessful telephone outreach attempt made. A HIPAA compliant phone message was left for the patient providing contact information and requesting a return call. Unable to make contact on outreach attempts x 3. PCP Dr. Leveda Anna notified via routed documentation in medical record. We have been unable to make contact with the patient for follow up. The care management team is available to follow up with the patient after provider conversation with the patient regarding recommendation for care management engagement and subsequent re-referral to the care management team.   [facility] Guide, Embedded Care Coordination [facility]  Care Management  Direct Dial: [phone]

## 2022-02-25 ENCOUNTER — Telehealth: Payer: Medicare Other

## 2022-03-06 DIAGNOSIS — M5116 Intervertebral disc disorders with radiculopathy, lumbar region: Secondary | ICD-10-CM | POA: Diagnosis not present

## 2022-03-30 ENCOUNTER — Other Ambulatory Visit: Payer: Self-pay | Admitting: Family Medicine

## 2022-03-30 DIAGNOSIS — J329 Chronic sinusitis, unspecified: Secondary | ICD-10-CM

## 2022-04-11 ENCOUNTER — Telehealth: Payer: Self-pay | Admitting: *Deleted

## 2022-04-11 NOTE — Telephone Encounter (Addendum)
Received a refill request for sumatriptan 100mg  tab that says it needs a PA.  Pt has not been seen since Randee Upchurch of 2022.  LVM for pt to call office back to schedule appointment for refill.Will route to PCP just as an FYI, in case he wants to send in and start PA before then. Riyan Gavina Zimmerman Rumple, CMA

## 2022-04-12 NOTE — Telephone Encounter (Signed)
Agree, needs appointment.

## 2022-04-18 ENCOUNTER — Other Ambulatory Visit: Payer: Self-pay | Admitting: Family Medicine

## 2022-04-18 DIAGNOSIS — E038 Other specified hypothyroidism: Secondary | ICD-10-CM

## 2022-05-02 DIAGNOSIS — R300 Dysuria: Secondary | ICD-10-CM | POA: Diagnosis not present

## 2022-05-02 DIAGNOSIS — N390 Urinary tract infection, site not specified: Secondary | ICD-10-CM | POA: Diagnosis not present

## 2022-05-13 DIAGNOSIS — H18513 Endothelial corneal dystrophy, bilateral: Secondary | ICD-10-CM | POA: Diagnosis not present

## 2022-05-14 DIAGNOSIS — M47816 Spondylosis without myelopathy or radiculopathy, lumbar region: Secondary | ICD-10-CM | POA: Diagnosis not present

## 2022-05-14 DIAGNOSIS — M5136 Other intervertebral disc degeneration, lumbar region: Secondary | ICD-10-CM | POA: Diagnosis not present

## 2022-05-14 DIAGNOSIS — M5416 Radiculopathy, lumbar region: Secondary | ICD-10-CM | POA: Diagnosis not present

## 2022-05-14 DIAGNOSIS — N390 Urinary tract infection, site not specified: Secondary | ICD-10-CM | POA: Diagnosis not present

## 2022-05-15 ENCOUNTER — Other Ambulatory Visit (HOSPITAL_COMMUNITY): Payer: Self-pay | Admitting: Sports Medicine

## 2022-05-15 ENCOUNTER — Other Ambulatory Visit (HOSPITAL_BASED_OUTPATIENT_CLINIC_OR_DEPARTMENT_OTHER): Payer: Self-pay | Admitting: Sports Medicine

## 2022-05-15 ENCOUNTER — Other Ambulatory Visit: Payer: Self-pay | Admitting: Sports Medicine

## 2022-05-15 DIAGNOSIS — M5136 Other intervertebral disc degeneration, lumbar region: Secondary | ICD-10-CM

## 2022-05-15 DIAGNOSIS — M5416 Radiculopathy, lumbar region: Secondary | ICD-10-CM

## 2022-05-15 DIAGNOSIS — M47816 Spondylosis without myelopathy or radiculopathy, lumbar region: Secondary | ICD-10-CM

## 2022-05-22 ENCOUNTER — Telehealth: Payer: Self-pay

## 2022-05-22 DIAGNOSIS — F32A Depression, unspecified: Secondary | ICD-10-CM

## 2022-05-22 MED ORDER — SERTRALINE HCL 50 MG PO TABS: 50.0000 mg | ORAL_TABLET | Freq: Every day | ORAL | 3 refills | Status: AC

## 2022-05-22 NOTE — Telephone Encounter (Signed)
I refilled the sertraline as requested.  I did not refill the robaxin - it is a drug not recommended in her age group.  She will need an appointment with me if she wishes to discuss this further.

## 2022-05-22 NOTE — Telephone Encounter (Signed)
Patient calls nurse line requesting a refill on Zoloft and Robaxin.   I do not see Robaxin on her current medication list.   Patient reports she has not refilled in a while, however she "needs it bad."   Will forward to PCP.

## 2022-05-23 NOTE — Telephone Encounter (Signed)
Contacted patient.   Patient scheduled for 10/16 with PCP to discuss medication.   Patient is asking for a "small" supply to get her to apt. Patient reports Robaxin is the only thing that settles her back. Reports MRI on 10/12 of lumbar spine.   Will forward to PCP.

## 2022-05-27 ENCOUNTER — Ambulatory Visit
Admission: EM | Admit: 2022-05-27 | Discharge: 2022-05-27 | Disposition: A | Discharge status: Home / Self Care | Payer: Medicare Other | Attending: Family Medicine | Admitting: Family Medicine

## 2022-05-27 DIAGNOSIS — L03115 Cellulitis of right lower limb: Secondary | ICD-10-CM

## 2022-05-27 DIAGNOSIS — L539 Erythematous condition, unspecified: Secondary | ICD-10-CM | POA: Diagnosis not present

## 2022-05-27 DIAGNOSIS — R600 Localized edema: Secondary | ICD-10-CM

## 2022-05-27 MED ORDER — DOXYCYCLINE HYCLATE 100 MG PO CAPS: 100.0000 mg | ORAL_CAPSULE | Freq: Two times a day (BID) | ORAL | 0 refills | Status: DC
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
## 2022-05-27 NOTE — Discharge Instructions (Signed)
Apply the hydrocortisone cream as needed for itch, take the full course of antibiotics to cover for possible infection, elevate the legs at rest to help with the swelling.  Follow-up with your primary care provider within the next week to recheck the area.

## 2022-05-27 NOTE — ED Triage Notes (Signed)
Pt presents with right lower leg swelling and redness that began 3 days ago

## 2022-05-28 DIAGNOSIS — H16303 Unspecified interstitial keratitis, bilateral: Secondary | ICD-10-CM | POA: Diagnosis not present

## 2022-06-06 ENCOUNTER — Ambulatory Visit (HOSPITAL_COMMUNITY): Payer: Medicare Other

## 2022-06-10 ENCOUNTER — Ambulatory Visit: Payer: Medicare Other | Admitting: Family Medicine

## 2022-06-28 ENCOUNTER — Ambulatory Visit (HOSPITAL_COMMUNITY)
Admission: RE | Admit: 2022-06-28 | Discharge: 2022-06-28 | Disposition: A | Discharge status: Home / Self Care | Payer: Medicare Other | Source: Ambulatory Visit | Attending: Sports Medicine | Admitting: Sports Medicine

## 2022-06-28 DIAGNOSIS — M5136 Other intervertebral disc degeneration, lumbar region: Secondary | ICD-10-CM | POA: Insufficient documentation

## 2022-06-28 DIAGNOSIS — M47816 Spondylosis without myelopathy or radiculopathy, lumbar region: Secondary | ICD-10-CM | POA: Insufficient documentation

## 2022-06-28 DIAGNOSIS — M48061 Spinal stenosis, lumbar region without neurogenic claudication: Secondary | ICD-10-CM | POA: Diagnosis not present

## 2022-06-28 DIAGNOSIS — M4316 Spondylolisthesis, lumbar region: Secondary | ICD-10-CM | POA: Diagnosis not present

## 2022-06-28 DIAGNOSIS — M545 Low back pain, unspecified: Secondary | ICD-10-CM | POA: Diagnosis not present

## 2022-06-28 DIAGNOSIS — M5416 Radiculopathy, lumbar region: Secondary | ICD-10-CM | POA: Diagnosis not present

## 2022-07-11 DIAGNOSIS — J0140 Acute pansinusitis, unspecified: Secondary | ICD-10-CM | POA: Diagnosis not present

## 2022-07-11 DIAGNOSIS — M545 Low back pain, unspecified: Secondary | ICD-10-CM | POA: Diagnosis not present

## 2022-07-21 ENCOUNTER — Other Ambulatory Visit: Payer: Self-pay | Admitting: Family Medicine

## 2022-07-21 ENCOUNTER — Other Ambulatory Visit: Payer: Self-pay | Admitting: Gastroenterology

## 2022-07-21 DIAGNOSIS — G43009 Migraine without aura, not intractable, without status migrainosus: Secondary | ICD-10-CM

## 2022-08-02 DIAGNOSIS — J209 Acute bronchitis, unspecified: Secondary | ICD-10-CM | POA: Diagnosis not present

## 2022-08-22 ENCOUNTER — Other Ambulatory Visit: Payer: Self-pay | Admitting: Gastroenterology

## 2022-09-20 ENCOUNTER — Ambulatory Visit (INDEPENDENT_AMBULATORY_CARE_PROVIDER_SITE_OTHER): Payer: Medicare Other | Admitting: Nurse Practitioner

## 2022-09-20 ENCOUNTER — Other Ambulatory Visit (HOSPITAL_COMMUNITY)
Admission: RE | Admit: 2022-09-20 | Discharge: 2022-09-20 | Disposition: A | Discharge status: Home / Self Care | Payer: Medicare Other | Source: Ambulatory Visit | Attending: Nurse Practitioner | Admitting: Nurse Practitioner

## 2022-09-20 ENCOUNTER — Encounter: Payer: Self-pay | Admitting: *Deleted

## 2022-09-20 ENCOUNTER — Encounter: Payer: Self-pay | Admitting: Nurse Practitioner

## 2022-09-20 VITALS — BP 116/66 | HR 68 | Ht 61.0 in | Wt 183.4 lb

## 2022-09-20 DIAGNOSIS — I779 Disorder of arteries and arterioles, unspecified: Secondary | ICD-10-CM | POA: Insufficient documentation

## 2022-09-20 DIAGNOSIS — Z79899 Other long term (current) drug therapy: Secondary | ICD-10-CM

## 2022-09-20 DIAGNOSIS — I38 Endocarditis, valve unspecified: Secondary | ICD-10-CM | POA: Insufficient documentation

## 2022-09-20 DIAGNOSIS — I5032 Chronic diastolic (congestive) heart failure: Secondary | ICD-10-CM

## 2022-09-20 DIAGNOSIS — R5383 Other fatigue: Secondary | ICD-10-CM

## 2022-09-20 DIAGNOSIS — R079 Chest pain, unspecified: Secondary | ICD-10-CM | POA: Diagnosis not present

## 2022-09-20 DIAGNOSIS — R0602 Shortness of breath: Secondary | ICD-10-CM

## 2022-09-20 DIAGNOSIS — E785 Hyperlipidemia, unspecified: Secondary | ICD-10-CM

## 2022-09-20 DIAGNOSIS — Z1329 Encounter for screening for other suspected endocrine disorder: Secondary | ICD-10-CM | POA: Insufficient documentation

## 2022-09-20 DIAGNOSIS — I10 Essential (primary) hypertension: Secondary | ICD-10-CM | POA: Insufficient documentation

## 2022-09-20 DIAGNOSIS — I5033 Acute on chronic diastolic (congestive) heart failure: Secondary | ICD-10-CM | POA: Insufficient documentation

## 2022-09-20 LAB — COMPREHENSIVE METABOLIC PANEL
ALT: 16 U/L (ref 0–44)
AST: 24 U/L (ref 15–41)
Albumin: 3.8 g/dL (ref 3.5–5.0)
Alkaline Phosphatase: 92 U/L (ref 38–126)
Anion gap: 9 (ref 5–15)
BUN: 17 mg/dL (ref 8–23)
CO2: 30 mmol/L (ref 22–32)
Calcium: 9.7 mg/dL (ref 8.9–10.3)
Chloride: 101 mmol/L (ref 98–111)
Creatinine, Ser: 0.65 mg/dL (ref 0.44–1.00)
GFR, Estimated: >60 mL/min (ref >60)
Glucose, Bld: 105 mg/dL — ABNORMAL HIGH (ref 70–99)
Potassium: 4.1 mmol/L (ref 3.5–5.1)
Sodium: 140 mmol/L (ref 135–145)
Total Bilirubin: 0.4 mg/dL (ref 0.3–1.2)
Total Protein: 7.8 g/dL (ref 6.5–8.1)

## 2022-09-20 LAB — LIPID PANEL
Cholesterol: 152 mg/dL (ref 0–200)
HDL: 60 mg/dL (ref >40)
LDL Cholesterol: 77 mg/dL (ref 0–99)
Total CHOL/HDL Ratio: 2.5 RATIO
Triglycerides: 76 mg/dL (ref <150)
VLDL: 15 mg/dL (ref 0–40)

## 2022-09-20 LAB — CBC
HCT: 37.8 % (ref 36.0–46.0)
Hemoglobin: 11.9 g/dL — ABNORMAL LOW (ref 12.0–15.0)
MCH: 28.5 pg (ref 26.0–34.0)
MCHC: 31.5 g/dL (ref 30.0–36.0)
MCV: 90.4 fL (ref 80.0–100.0)
Platelets: 322 10*3/uL (ref 150–400)
RBC: 4.18 MIL/uL (ref 3.87–5.11)
RDW: 15.8 % — ABNORMAL HIGH (ref 11.5–15.5)
WBC: 8.9 10*3/uL (ref 4.0–10.5)
nRBC: 0 % (ref 0.0–0.2)

## 2022-09-20 LAB — BRAIN NATRIURETIC PEPTIDE: B Natriuretic Peptide: 69 pg/mL (ref 0.0–100.0)
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
## 2022-09-20 NOTE — Patient Instructions (Signed)
Medication Instructions:  Continue all current medications.   Labwork: CBC, CMET, Thyroid Panel, BNP, FLP - orders given today  Reminder:  Nothing to eat or drink after 12 midnight prior to labs.   Testing/Procedures: Your physician has requested that you have an echocardiogram. Echocardiography is a painless test that uses sound waves to create images of your heart. It provides your doctor with information about the size and shape of your heart and how well your heart's chambers and valves are working. This procedure takes approximately one hour. There are no restrictions for this procedure. Please do NOT wear cologne, perfume, aftershave, or lotions (deodorant is allowed). Please arrive 15 minutes prior to your appointment time.  Your physician has requested that you have a lexiscan myoview. For further information please visit [email or web address]. Please follow instruction sheet, as given.  Follow-Up: Office will contact with results via phone, letter or mychart.    6-8 weeks    Any Other Special Instructions Will Be Listed Below (If Applicable). Weight log [place]   If you need a refill on your cardiac medications before your next appointment, please call your pharmacy.

## 2022-09-25 LAB — THYROID PANEL
Free Thyroxine Index: 2.1 (ref 1.2–4.9)
T3 Uptake Ratio: 25 % (ref 24–39)
T4, Total: 8.5 ug/dL (ref 4.5–12.0)

## 2022-09-26 ENCOUNTER — Ambulatory Visit (HOSPITAL_COMMUNITY)
Admission: RE | Admit: 2022-09-26 | Discharge: 2022-09-26 | Disposition: A | Discharge status: Home / Self Care | Payer: Medicare Other | Source: Ambulatory Visit | Attending: Nurse Practitioner | Admitting: Nurse Practitioner

## 2022-09-26 ENCOUNTER — Ambulatory Visit (HOSPITAL_COMMUNITY)
Admission: RE | Admit: 2022-09-26 | Discharge: 2022-09-26 | Disposition: A | Discharge status: Home / Self Care | Payer: Medicare Other | Source: Ambulatory Visit | Attending: Internal Medicine | Admitting: Internal Medicine

## 2022-09-26 DIAGNOSIS — R0602 Shortness of breath: Secondary | ICD-10-CM

## 2022-09-26 DIAGNOSIS — R079 Chest pain, unspecified: Secondary | ICD-10-CM | POA: Insufficient documentation

## 2022-09-26 LAB — NM MYOCAR MULTI W/SPECT W/WALL MOTION / EF
LV dias vol: 50 mL (ref 46–106)
LV sys vol: 11 mL
Nuc Stress EF: 77 %
Peak HR: 88 {beats}/min
RATE: 0.4
Rest HR: 79 {beats}/min
Rest Nuclear Isotope Dose: 10 mCi
SDS: 3
SRS: 0
SSS: 3
ST Depression (mm): 0 mm
Stress Nuclear Isotope Dose: 30 mCi
TID: 0.88

## 2022-09-26 MED ORDER — SODIUM CHLORIDE FLUSH 0.9 % IV SOLN
INTRAVENOUS | Status: AC
  Administered 2022-09-26: 10 mL
  Filled 2022-09-26: qty 10

## 2022-09-26 MED ORDER — TECHNETIUM TC 99M TETROFOSMIN IV KIT
30.0000 | PACK | Freq: Once | INTRAVENOUS | Status: AC | PRN
  Administered 2022-09-26: 30 via INTRAVENOUS

## 2022-09-26 MED ORDER — REGADENOSON 0.4 MG/5ML IV SOLN
INTRAVENOUS | Status: AC
  Administered 2022-09-26: 0.4 mg
  Filled 2022-09-26: qty 5

## 2022-09-26 MED ORDER — TECHNETIUM TC 99M TETROFOSMIN IV KIT
10.0000 | PACK | Freq: Once | INTRAVENOUS | Status: AC | PRN
  Administered 2022-09-26: 10 via INTRAVENOUS

## 2022-10-04 ENCOUNTER — Telehealth: Payer: Self-pay | Admitting: Nurse Practitioner

## 2022-10-04 NOTE — Telephone Encounter (Signed)
LABS -  Please update Theresa Brennan and let her know that all of her recent labs are unremarkable. Great result!   Thanks!   Finis Bud, AGNP-C  STRESS TEST -  Please update Theresa Brennan and inform her that her stress test came back normal. Very good result! Will discuss this more with her when I see her back in the office.   Thanks!   Finis Bud, AGNP-C

## 2022-10-04 NOTE — Telephone Encounter (Signed)
  Laurine Blazer, LPN 12/29/9739  6:38 PM EST Back to Top    Notified, copy to pcp.

## 2022-10-04 NOTE — Telephone Encounter (Signed)
Follow Up:     Patient is returning call from today. She said she thought it was [employer].

## 2022-10-09 ENCOUNTER — Other Ambulatory Visit: Payer: Medicare Other

## 2022-10-28 ENCOUNTER — Telehealth: Payer: Self-pay | Admitting: Internal Medicine

## 2022-10-28 NOTE — Telephone Encounter (Signed)
Advised that appointment with Theresa Brennan should be rescheduled until all ordered tests are completed. Echo is 11/12/2022 so f/u appointment scheduled for 11/15/2022 '@1'$ :00 pm with Theresa Brennan. Verbalized understanding.

## 2022-10-28 NOTE — Telephone Encounter (Signed)
Pt is calling about her appt for tomorrow with Finis Bud. Pt is stating that she missed her EKG appt and wanted to reschedule but she wanted to know if she needs to reschedule her appt with Finis Bud for after she has the EKG. I went ahead and schedule the pt for 03/19 at 11:30 AM for her EKG. Pt is requesting to have someone call her back.

## 2022-10-29 ENCOUNTER — Ambulatory Visit: Payer: Medicare Other | Admitting: Nurse Practitioner

## 2022-11-08 ENCOUNTER — Other Ambulatory Visit: Payer: Self-pay | Admitting: Family Medicine

## 2022-11-08 DIAGNOSIS — G43009 Migraine without aura, not intractable, without status migrainosus: Secondary | ICD-10-CM

## 2022-11-12 ENCOUNTER — Ambulatory Visit: Payer: Medicare Other | Attending: Nurse Practitioner

## 2022-11-12 ENCOUNTER — Other Ambulatory Visit: Payer: Self-pay | Admitting: Family Medicine

## 2022-11-12 DIAGNOSIS — G43009 Migraine without aura, not intractable, without status migrainosus: Secondary | ICD-10-CM

## 2022-11-12 DIAGNOSIS — R079 Chest pain, unspecified: Secondary | ICD-10-CM | POA: Diagnosis not present

## 2022-11-12 LAB — ECHOCARDIOGRAM COMPLETE
AR max vel: 1.57 cm2
AV Peak grad: 13.4 mmHg
Ao pk vel: 1.83 m/s
Area-P 1/2: 3.42 cm2
Calc EF: 62 %
MV M vel: 3.96 m/s
MV Peak grad: 62.6 mmHg
S' Lateral: 2.5 cm
Single Plane A2C EF: 57.9 %
Single Plane A4C EF: 64.8 %

## 2022-11-14 ENCOUNTER — Other Ambulatory Visit: Payer: Self-pay | Admitting: Family Medicine

## 2022-11-14 ENCOUNTER — Other Ambulatory Visit: Payer: Self-pay | Admitting: Gastroenterology

## 2022-11-15 ENCOUNTER — Ambulatory Visit: Payer: Medicare Other | Attending: Nurse Practitioner | Admitting: Nurse Practitioner

## 2022-11-15 ENCOUNTER — Encounter: Payer: Self-pay | Admitting: Nurse Practitioner

## 2022-11-15 VITALS — BP 120/60 | HR 76 | Ht 61.0 in | Wt 186.8 lb

## 2022-11-15 DIAGNOSIS — M7989 Other specified soft tissue disorders: Secondary | ICD-10-CM | POA: Insufficient documentation

## 2022-11-15 DIAGNOSIS — I5032 Chronic diastolic (congestive) heart failure: Secondary | ICD-10-CM | POA: Insufficient documentation

## 2022-11-15 DIAGNOSIS — E785 Hyperlipidemia, unspecified: Secondary | ICD-10-CM | POA: Insufficient documentation

## 2022-11-15 DIAGNOSIS — I6529 Occlusion and stenosis of unspecified carotid artery: Secondary | ICD-10-CM

## 2022-11-15 DIAGNOSIS — I38 Endocarditis, valve unspecified: Secondary | ICD-10-CM | POA: Diagnosis present

## 2022-11-15 DIAGNOSIS — I10 Essential (primary) hypertension: Secondary | ICD-10-CM | POA: Insufficient documentation

## 2022-11-15 DIAGNOSIS — I6523 Occlusion and stenosis of bilateral carotid arteries: Secondary | ICD-10-CM
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
## 2022-11-15 NOTE — Patient Instructions (Signed)
Medication Instructions:  Your physician recommends that you continue on your current medications as directed. Please refer to the Current Medication list given to you today.   Labwork: none  Testing/Procedures: Your physician has requested that you have a carotid duplex. This test is an ultrasound of the carotid arteries in your neck. It looks at blood flow through these arteries that supply the brain with blood. Allow one hour for this exam. There are no restrictions or special instructions.   Doppler Ultrasound of the lower extremity  Follow-Up:  Your physician recommends that you schedule a follow-up appointment in: 3-4 months  Any Other Special Instructions Will Be Listed Below (If Applicable).  If you need a refill on your cardiac medications before your next appointment, please call your pharmacy.

## 2022-11-16 ENCOUNTER — Other Ambulatory Visit: Payer: Self-pay | Admitting: Gastroenterology

## 2022-11-17 ENCOUNTER — Encounter: Payer: Self-pay | Admitting: Nurse Practitioner

## 2022-12-04 ENCOUNTER — Telehealth: Payer: Self-pay | Admitting: Gastroenterology

## 2022-12-04 NOTE — Telephone Encounter (Signed)
Patient called stating she has dark stool. Scheduled for 01/28/2023. Would like to discuss if there is anything she could do prior. Also states she needs refill for medication. Please advise.

## 2022-12-04 NOTE — Telephone Encounter (Signed)
Returned call to patient. Pt states that she has noticed dark stools for the last 2 weeks. Pt denies taking any iron supplements or Pepto-bismol. Pt denies seeing any BRB, or black-tarry stools. Pt states that she has not changed her diet or started any new supplements in the last 2 weeks. Patient also states that her reflux is not well controlled. She has not taken Aciphex in about 2 weeks, her esophagus feels raw and burning. Pt states that she has to sleep in the recliner at times. I reviewed antireflux diet with patient b/c she mentioned spaghetti sauce making her reflux worse as expected. Pt states that her reflux is not as bad when she is taking Aciphex but they are not completely resolved either. Pt has not been seen in almost 2 years. Her f.u appt has been rescheduled to Friday, 12/06/22 at 1:30 pm. Patient has been advised to continue monitoring her stools. Shanda Bumps will determine if patient needs procedures or changes to medication. Pt verbalized understanding and had no concerns at the end of the call.

## 2022-12-05 ENCOUNTER — Telehealth: Payer: Self-pay | Admitting: Nurse Practitioner

## 2022-12-05 NOTE — Telephone Encounter (Signed)
Patient is calling to receive her results that was sent

## 2022-12-05 NOTE — Telephone Encounter (Signed)
Says she did not call for test results Says she was returning a call to our office since someone from our office called her. Advised it may have been the phone tree to remind her of her appointments next week 12/10/2022 2 pm & 3 pm.  Verbalized understanding.

## 2022-12-06 ENCOUNTER — Ambulatory Visit: Payer: Medicare Other | Admitting: Gastroenterology

## 2022-12-10 ENCOUNTER — Ambulatory Visit: Payer: Medicare Other | Attending: Nurse Practitioner

## 2022-12-10 ENCOUNTER — Ambulatory Visit: Payer: Medicare Other

## 2022-12-10 DIAGNOSIS — M7989 Other specified soft tissue disorders: Secondary | ICD-10-CM

## 2023-01-02 ENCOUNTER — Telehealth: Payer: Self-pay | Admitting: Internal Medicine

## 2023-01-02 NOTE — Telephone Encounter (Signed)
Patient called to cancel Carotid test as she has a really bad headache.  Patient stated she will call back to reschedule.

## 2023-01-28 ENCOUNTER — Ambulatory Visit: Payer: Medicare Other | Admitting: Gastroenterology

## 2023-03-18 ENCOUNTER — Ambulatory Visit: Payer: Medicare Other | Admitting: Nurse Practitioner

## 2023-03-20 ENCOUNTER — Ambulatory Visit: Payer: Medicare Other | Attending: Nurse Practitioner

## 2023-03-20 DIAGNOSIS — I6529 Occlusion and stenosis of unspecified carotid artery: Secondary | ICD-10-CM | POA: Diagnosis present

## 2023-03-20 DIAGNOSIS — I6521 Occlusion and stenosis of right carotid artery: Secondary | ICD-10-CM | POA: Diagnosis not present

## 2023-04-01 ENCOUNTER — Other Ambulatory Visit (HOSPITAL_COMMUNITY): Payer: Self-pay | Admitting: Family Medicine

## 2023-04-01 DIAGNOSIS — Z1231 Encounter for screening mammogram for malignant neoplasm of breast: Secondary | ICD-10-CM

## 2023-04-09 ENCOUNTER — Inpatient Hospital Stay (HOSPITAL_COMMUNITY): Admission: RE | Admit: 2023-04-09 | Payer: Medicare Other | Source: Ambulatory Visit

## 2023-04-16 ENCOUNTER — Ambulatory Visit: Payer: Medicare Other | Admitting: Nurse Practitioner

## 2023-04-17 ENCOUNTER — Inpatient Hospital Stay (HOSPITAL_COMMUNITY): Admission: RE | Admit: 2023-04-17 | Payer: Self-pay | Source: Ambulatory Visit

## 2023-04-22 ENCOUNTER — Ambulatory Visit: Payer: Medicare Other | Admitting: Psychiatry

## 2023-04-25 ENCOUNTER — Ambulatory Visit (HOSPITAL_COMMUNITY): Payer: Medicare PPO

## 2023-05-01 ENCOUNTER — Inpatient Hospital Stay (HOSPITAL_COMMUNITY): Admission: RE | Admit: 2023-05-01 | Payer: Medicare PPO | Source: Ambulatory Visit

## 2023-05-08 ENCOUNTER — Ambulatory Visit (INDEPENDENT_AMBULATORY_CARE_PROVIDER_SITE_OTHER): Payer: Medicare PPO | Admitting: Psychiatry

## 2023-05-08 ENCOUNTER — Telehealth: Payer: Self-pay | Admitting: *Deleted

## 2023-05-08 ENCOUNTER — Encounter: Payer: Self-pay | Admitting: Psychiatry

## 2023-05-08 VITALS — BP 135/59 | HR 73 | Ht 61.0 in | Wt 184.0 lb

## 2023-05-08 DIAGNOSIS — R519 Headache, unspecified: Secondary | ICD-10-CM

## 2023-05-08 DIAGNOSIS — G43E19 Chronic migraine with aura, intractable, without status migrainosus: Secondary | ICD-10-CM | POA: Diagnosis not present

## 2023-05-08 MED ORDER — UBRELVY 100 MG PO TABS: 100.0000 mg | ORAL_TABLET | ORAL | 6 refills | Status: DC | PRN

## 2023-05-08 NOTE — Telephone Encounter (Signed)
new botox start for Theresa Brennan (November 03, 1945)?

## 2023-05-08 NOTE — Telephone Encounter (Signed)
Completed benefit verification, will update once results are received. [id number]
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
## 2023-05-09 LAB — SEDIMENTATION RATE: Sed Rate: 23 mm/h (ref 0–40)

## 2023-05-12 ENCOUNTER — Other Ambulatory Visit: Payer: Self-pay

## 2023-05-12 ENCOUNTER — Other Ambulatory Visit (HOSPITAL_COMMUNITY): Payer: Self-pay

## 2023-05-12 MED ORDER — ONABOTULINUMTOXINA 200 UNITS IJ SOLR
INTRAMUSCULAR | 2 refills | Status: DC
  Filled 2023-05-12: qty 1, fill #0

## 2023-05-12 NOTE — Telephone Encounter (Signed)
Submitted auth via cmm, status is pending. Key: [id number]

## 2023-05-12 NOTE — Telephone Encounter (Signed)
Received fax of approval from [facility]. [place] Case: [id number] (05/12/23-08/26/23)  Please send rx to [facility], thank you!

## 2023-05-12 NOTE — Telephone Encounter (Signed)
Rx sent to pharmacy   

## 2023-05-12 NOTE — Addendum Note (Signed)
Addended by: Aura Camps on: 05/12/2023 01:26 PM   Modules accepted: Orders

## 2023-05-13 ENCOUNTER — Other Ambulatory Visit: Payer: Self-pay

## 2023-05-26 NOTE — Telephone Encounter (Signed)
Patient scheduled for initial botox appointment with Shanda Bumps for 06/12/23 at 3:15pm and appt reminder sent in mail

## 2023-05-30 ENCOUNTER — Telehealth: Payer: Self-pay

## 2023-05-30 ENCOUNTER — Other Ambulatory Visit (HOSPITAL_COMMUNITY): Payer: Self-pay

## 2023-05-30 NOTE — Telephone Encounter (Signed)
*  [facility]  Pharmacy Patient Advocate Encounter  Received notification from [facility] that Prior Authorization for Ubrelvy 100MG  tablets  has been APPROVED from 05/30/2023 to 08/24/2024  Per test claim co-pay is : $47.00   PA #/Case ID/Reference #: [id number]

## 2023-06-02 DIAGNOSIS — M79672 Pain in left foot: Secondary | ICD-10-CM | POA: Diagnosis not present

## 2023-06-02 DIAGNOSIS — M7662 Achilles tendinitis, left leg: Secondary | ICD-10-CM | POA: Diagnosis not present

## 2023-06-10 ENCOUNTER — Other Ambulatory Visit (HOSPITAL_COMMUNITY): Payer: Self-pay

## 2023-06-10 ENCOUNTER — Other Ambulatory Visit: Payer: Self-pay

## 2023-06-10 NOTE — Telephone Encounter (Signed)
Received message from [facility] that pt was declining to fill Botox at this time due to high $300 copay. I called pt to see if she'd like to cancel her appt or come in to discuss alternative treatment. She stated she would like to come in. Botox appt changed to OV on 10/17 @ 3:15 pm with Shanda Bumps.

## 2023-06-12 ENCOUNTER — Ambulatory Visit: Payer: Medicare PPO | Admitting: Adult Health
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
## 2023-07-11 DIAGNOSIS — H90A31 Mixed conductive and sensorineural hearing loss, unilateral, right ear with restricted hearing on the contralateral side: Secondary | ICD-10-CM | POA: Diagnosis not present

## 2023-07-11 DIAGNOSIS — Z9889 Other specified postprocedural states: Secondary | ICD-10-CM | POA: Diagnosis not present

## 2023-08-06 DIAGNOSIS — R519 Headache, unspecified: Secondary | ICD-10-CM | POA: Diagnosis not present

## 2023-08-06 DIAGNOSIS — J209 Acute bronchitis, unspecified: Secondary | ICD-10-CM | POA: Diagnosis not present

## 2023-08-06 DIAGNOSIS — Z20822 Contact with and (suspected) exposure to covid-19: Secondary | ICD-10-CM | POA: Diagnosis not present

## 2023-08-07 ENCOUNTER — Other Ambulatory Visit: Payer: Self-pay

## 2023-08-22 DIAGNOSIS — K219 Gastro-esophageal reflux disease without esophagitis: Secondary | ICD-10-CM | POA: Diagnosis not present

## 2023-08-22 DIAGNOSIS — Z90710 Acquired absence of both cervix and uterus: Secondary | ICD-10-CM | POA: Diagnosis not present

## 2023-08-22 DIAGNOSIS — U099 Post covid-19 condition, unspecified: Secondary | ICD-10-CM | POA: Diagnosis not present

## 2023-08-22 DIAGNOSIS — R059 Cough, unspecified: Secondary | ICD-10-CM | POA: Diagnosis not present

## 2023-08-22 DIAGNOSIS — R042 Hemoptysis: Secondary | ICD-10-CM | POA: Diagnosis not present

## 2023-08-22 DIAGNOSIS — E039 Hypothyroidism, unspecified: Secondary | ICD-10-CM | POA: Diagnosis not present

## 2023-09-24 ENCOUNTER — Telehealth: Payer: Self-pay | Admitting: Psychiatry

## 2023-09-24 NOTE — Telephone Encounter (Signed)
Pt last seen 05/08/23 by Dr. Delena Bali. Cx appt w/ Jessica M,NP 06/12/23 d/t having flu and said she would call back to r/s.  Has no upcoming appt.  Courtney/granddaughter on DPR. I called and LVM asking for call back. We need to get her mother scheduled with MD to get her established with new doctor. Phone room: please schedule if she calls.

## 2023-09-24 NOTE — Telephone Encounter (Signed)
Pt's granddaughter called, she accepted office visit with Dr Epimenio Foot on tomorrow, she is aware pt is to check in at 1:00 for 1:30 appointment

## 2023-09-24 NOTE — Telephone Encounter (Signed)
Pt's granddaughter called wanting to speak to the Rn regarding the next step the pt has to take to continue to treat her Migraines. Please advise.

## 2023-09-25 ENCOUNTER — Encounter: Payer: Self-pay | Admitting: Neurology

## 2023-09-25 ENCOUNTER — Ambulatory Visit: Payer: Medicare Other | Admitting: Neurology

## 2023-09-25 VITALS — BP 158/74 | HR 77 | Ht 61.5 in | Wt 175.2 lb

## 2023-09-25 DIAGNOSIS — M542 Cervicalgia: Secondary | ICD-10-CM | POA: Insufficient documentation

## 2023-09-25 DIAGNOSIS — G43709 Chronic migraine without aura, not intractable, without status migrainosus: Secondary | ICD-10-CM | POA: Diagnosis not present

## 2023-09-25 MED ORDER — IMIPRAMINE HCL 10 MG PO TABS: 10.0000 mg | ORAL_TABLET | Freq: Every day | ORAL | 11 refills | Status: DC
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
## 2023-10-01 DIAGNOSIS — Z961 Presence of intraocular lens: Secondary | ICD-10-CM | POA: Diagnosis not present

## 2023-10-01 DIAGNOSIS — H26499 Other secondary cataract, unspecified eye: Secondary | ICD-10-CM | POA: Diagnosis not present

## 2023-10-30 DIAGNOSIS — M5136 Other intervertebral disc degeneration, lumbar region with discogenic back pain only: Secondary | ICD-10-CM | POA: Diagnosis not present

## 2023-10-30 DIAGNOSIS — G894 Chronic pain syndrome: Secondary | ICD-10-CM | POA: Diagnosis not present

## 2023-10-30 DIAGNOSIS — M5416 Radiculopathy, lumbar region: Secondary | ICD-10-CM | POA: Diagnosis not present

## 2023-11-24 DIAGNOSIS — E039 Hypothyroidism, unspecified: Secondary | ICD-10-CM | POA: Diagnosis not present

## 2023-11-24 DIAGNOSIS — E782 Mixed hyperlipidemia: Secondary | ICD-10-CM | POA: Diagnosis not present

## 2023-11-28 DIAGNOSIS — I6529 Occlusion and stenosis of unspecified carotid artery: Secondary | ICD-10-CM | POA: Diagnosis not present

## 2023-11-28 DIAGNOSIS — G43909 Migraine, unspecified, not intractable, without status migrainosus: Secondary | ICD-10-CM | POA: Diagnosis not present

## 2023-11-28 DIAGNOSIS — E782 Mixed hyperlipidemia: Secondary | ICD-10-CM | POA: Diagnosis not present

## 2023-11-28 DIAGNOSIS — M545 Low back pain, unspecified: Secondary | ICD-10-CM | POA: Diagnosis not present

## 2023-11-28 DIAGNOSIS — M7989 Other specified soft tissue disorders: Secondary | ICD-10-CM | POA: Diagnosis not present

## 2023-11-28 DIAGNOSIS — I509 Heart failure, unspecified: Secondary | ICD-10-CM | POA: Diagnosis not present

## 2023-11-28 DIAGNOSIS — R0789 Other chest pain: Secondary | ICD-10-CM | POA: Diagnosis not present

## 2023-11-28 DIAGNOSIS — K219 Gastro-esophageal reflux disease without esophagitis: Secondary | ICD-10-CM | POA: Diagnosis not present

## 2023-11-28 DIAGNOSIS — I110 Hypertensive heart disease with heart failure: Secondary | ICD-10-CM | POA: Diagnosis not present

## 2023-11-28 DIAGNOSIS — E039 Hypothyroidism, unspecified: Secondary | ICD-10-CM | POA: Diagnosis not present

## 2023-11-28 DIAGNOSIS — E876 Hypokalemia: Secondary | ICD-10-CM | POA: Diagnosis not present

## 2023-11-28 DIAGNOSIS — R7303 Prediabetes: Secondary | ICD-10-CM | POA: Diagnosis not present

## 2023-12-02 ENCOUNTER — Telehealth: Payer: Self-pay | Admitting: Nurse Practitioner

## 2023-12-02 DIAGNOSIS — H90A31 Mixed conductive and sensorineural hearing loss, unilateral, right ear with restricted hearing on the contralateral side: Secondary | ICD-10-CM | POA: Diagnosis not present

## 2023-12-02 DIAGNOSIS — J329 Chronic sinusitis, unspecified: Secondary | ICD-10-CM | POA: Diagnosis not present

## 2023-12-02 DIAGNOSIS — H6691 Otitis media, unspecified, right ear: Secondary | ICD-10-CM | POA: Diagnosis not present

## 2023-12-02 DIAGNOSIS — H95191 Other disorders following mastoidectomy, right ear: Secondary | ICD-10-CM | POA: Diagnosis not present

## 2023-12-02 DIAGNOSIS — Z9889 Other specified postprocedural states: Secondary | ICD-10-CM | POA: Diagnosis not present

## 2023-12-02 NOTE — Telephone Encounter (Signed)
   Pt c/o of Chest Pain: STAT if active CP, including tightness, pressure, jaw pain, radiating pain to shoulder/upper arm/back, CP unrelieved by Nitro. Symptoms reported of SOB, nausea, vomiting, sweating.  1. Are you having CP right now? No     2. Are you experiencing any other symptoms (ex. SOB, nausea, vomiting, sweating)? No    3. Is your CP continuous or coming and going? Coming and going    4. Have you taken Nitroglycerin? No    5. How long have you been experiencing CP? Couple months   States she saw her PCP yesterday and was told her heart "does not sound good"    6. If NO CP at time of call then end call with telling Pt to call back or call 911 if Chest pain returns prior to return call from triage team.

## 2023-12-02 NOTE — Telephone Encounter (Signed)
 Called patient to see what symptoms she was having. Not currently having chest pains, but had went to her PCP the other day and said that her heart didn't sound good. Her murmur. She hasn't been seen since last year 10/2022.She mentioned that she does have some chest pains at night sometimes. I asked does she check her BP at that time she reported she doesn't. I advised her that I will send to provider as she hasn't been seen in over a year and get more recommendations

## 2023-12-03 NOTE — Telephone Encounter (Signed)
 Per Lanora Manis:  Based on what she is saying, I recommend to bring her back in the office for further evaluation.  Lets get an EKG at this visit.  I want to interview her to get a better understanding what is going on with her symptoms.  Please have her log her vital signs including blood pressure and bring this to next office visit with MD/APP.  If progressive or worsening symptoms or sudden severe chest pain/shortness of breath, etc., recommend she go to the ED.  Thanks!  Best, Sharlene Dory, NP     Advised patient that our schedulers will call her to make an appointment to be seen. Will need to bring a log of her BP when she comes as well. Patient verbalized understanding

## 2023-12-23 DIAGNOSIS — N390 Urinary tract infection, site not specified: Secondary | ICD-10-CM | POA: Diagnosis not present

## 2023-12-23 DIAGNOSIS — R350 Frequency of micturition: Secondary | ICD-10-CM | POA: Diagnosis not present

## 2023-12-29 DIAGNOSIS — E039 Hypothyroidism, unspecified: Secondary | ICD-10-CM | POA: Diagnosis not present

## 2024-01-06 ENCOUNTER — Institutional Professional Consult (permissible substitution) (INDEPENDENT_AMBULATORY_CARE_PROVIDER_SITE_OTHER): Admitting: Otolaryngology

## 2024-01-06 ENCOUNTER — Ambulatory Visit (INDEPENDENT_AMBULATORY_CARE_PROVIDER_SITE_OTHER): Admitting: Audiology

## 2024-01-09 ENCOUNTER — Ambulatory Visit: Admitting: Nurse Practitioner

## 2024-01-12 ENCOUNTER — Encounter (INDEPENDENT_AMBULATORY_CARE_PROVIDER_SITE_OTHER): Payer: Self-pay

## 2024-03-19 ENCOUNTER — Ambulatory Visit: Admitting: Nurse Practitioner
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
## 2024-04-07 ENCOUNTER — Telehealth: Payer: Self-pay | Admitting: Neurology

## 2024-04-07 ENCOUNTER — Ambulatory Visit: Payer: Medicare Other | Admitting: Adult Health

## 2024-04-07 NOTE — Telephone Encounter (Signed)
 Patient reschedule appointment due to having a sinus infection and ear infection and coughing.  Rescheduled and put on the waitlist.

## 2024-04-22 ENCOUNTER — Encounter: Payer: Self-pay | Admitting: Nurse Practitioner

## 2024-04-22 ENCOUNTER — Ambulatory Visit: Payer: Self-pay | Attending: Nurse Practitioner | Admitting: Nurse Practitioner

## 2024-04-22 VITALS — BP 138/80 | HR 77 | Ht 61.5 in | Wt 174.0 lb

## 2024-04-22 DIAGNOSIS — I6529 Occlusion and stenosis of unspecified carotid artery: Secondary | ICD-10-CM

## 2024-04-22 DIAGNOSIS — R9431 Abnormal electrocardiogram [ECG] [EKG]: Secondary | ICD-10-CM

## 2024-04-22 DIAGNOSIS — I38 Endocarditis, valve unspecified: Secondary | ICD-10-CM

## 2024-04-22 DIAGNOSIS — M7989 Other specified soft tissue disorders: Secondary | ICD-10-CM

## 2024-04-22 DIAGNOSIS — E785 Hyperlipidemia, unspecified: Secondary | ICD-10-CM

## 2024-04-22 DIAGNOSIS — R079 Chest pain, unspecified: Secondary | ICD-10-CM | POA: Diagnosis not present

## 2024-04-22 DIAGNOSIS — Z79899 Other long term (current) drug therapy: Secondary | ICD-10-CM

## 2024-04-22 DIAGNOSIS — I10 Essential (primary) hypertension: Secondary | ICD-10-CM

## 2024-04-22 DIAGNOSIS — I5032 Chronic diastolic (congestive) heart failure: Secondary | ICD-10-CM

## 2024-04-22 NOTE — Progress Notes (Addendum)
 Cardiology Office Note:    Date:  04/22/2024 ID:  KYIAH Brennan, Theresa Brennan, Theresa Brennan PCP:  Shona Norleen PEDLAR, MD [facility] Providers Cardiologist:  Vinie JAYSON Maxcy, MD   Referring MD: Shona Norleen PEDLAR, MD   CC: Here for overdue follow-up   History of Present Illness:    Theresa Brennan is a delightful 78 y.o. female with a PMH of chronic diastolic CHF, HTN, dyslipidemia, valvular insufficiency, carotid artery disease, s/p right endarterectomy in 2016, interstitial cystitis, and hx of recurrent UTI's, who presents today for overdue follow-up.   Previous CV history includes normal left heart cath in 2016 with normal coronary arteries and normal LV function. Carotid doppler in 2018 showed patent right carotid endarterectomy site with no right ICA stenosis. 1-39% stenosis was noted along the left proximal ICA artery. ABIs in 2020 were normal.  Echocardiogram in 2022 showed stable normal systolic function with moderate diastolic dysfunction.   Last seen by Dr. Maxcy on February 23, 2021.  Patient reported shortness of breath, BNP was found to be minimally elevated.  PCP increased her Lasix  to 40 mg twice daily.  Patient reported not making a lot of urine, she was grossly volume overloaded on exam.  Metolazone  2.5 mg was added was advised to wear compression stockings.  11/15/2022 - She presents today for follow-up. Admits to recent sinus infection/URI symptoms. Denies any chest pain, shortness of breath, palpitations, syncope, presyncope, dizziness, orthopnea, PND, significant weight changes, acute bleeding, or claudication. Notes leg swelling.   Recent ED visit on 04/16/2024 for nausea, vomiting, and diarrhea. Also noted lower abdominal pain. CT of abdomen/pelvis was negative for anything acute.   Today she is here for overdue follow-up. She admits to recent sinus/ear infection. She does see an eye doctor and admits to recent stress. Notes occasional chest pain, brief and no severe or bothersome  per her report, rare in occurrence.  Denies any recent chest pain, shortness of breath, palpitations, syncope, presyncope, dizziness, orthopnea, PND, swelling or significant weight changes, acute bleeding, or claudication.  ROS:     Please see the history of present illness.    All other systems reviewed and are negative.  EKGs/Labs/Other Studies Reviewed:    The following studies were reviewed today:  EKG:   EKG Interpretation Date/Time:  Thursday April 22 2024 16:24:21 EDT Ventricular Rate:  77 PR Interval:  170 QRS Duration:  100 QT Interval:  430 QTC Calculation: 486 R Axis:   49  Text Interpretation: Sinus rhythm with Premature supraventricular complexes Incomplete right bundle branch block Nonspecific T wave abnormality Prolonged QT When compared with ECG of 17-Apr-2018 08:59, Premature supraventricular complexes are now Present Nonspecific T wave abnormality, worse in Anterior leads Confirmed by Miriam Norris [phone]) on 04/22/2024 4:28:20 PM   Carotid duplex 02/2023:  Summary:  Right Carotid: Velocities in the right ICA are consistent with a 40-59% stenosis. Non-hemodynamically significant plaque <50% noted  in the CCA. The ECA appears <50% stenosed. The right  endarterectomy site is patent.   Left Carotid: Velocities in the left ICA are consistent with a 1-39%  stenosis. Non-hemodynamically significant plaque <50% noted in the  CCA. The ECA appears <50% stenosed.   Vertebrals:  Bilateral vertebral arteries demonstrate antegrade flow.  Subclavians: Normal flow hemodynamics were seen in bilateral subclavian arteries.   *See table(s) above for measurements and observations.  Suggest follow up study in 12 months.  Echo 11/12/2022:  1. Left ventricular ejection fraction, by estimation, is 60  to 65%. The  left ventricle has normal function. The left ventricle has no regional  wall motion abnormalities. Left ventricular diastolic parameters are  consistent with Grade II  diastolic  dysfunction (pseudonormalization). The average left ventricular global  longitudinal strain is -22.1 %. The global longitudinal strain is normal.   2. Right ventricular systolic function is normal. The right ventricular  size is normal. There is mildly elevated pulmonary artery systolic  pressure. The estimated right ventricular systolic pressure is 37.8 mmHg.   3. The mitral valve is degenerative. Mild mitral valve regurgitation.   4. Tricuspid valve regurgitation is moderate.   5. The aortic valve is tricuspid. There is mild calcification of the  aortic valve. Aortic valve regurgitation is not visualized.   6. The inferior vena cava is normal in size with greater than 50%  respiratory variability, suggesting right atrial pressure of 3 mmHg.   Comparison(s): No significant change from prior study. Prior images  reviewed side by side.  Lexiscan  09/2022:   Findings are consistent with no ischemia. The study is low risk.   No ST deviation was noted. The ECG was negative for ischemia.   LV perfusion is normal.  Breast attenuation evident, no significant reversible perfusion defects to indicate ischemia.   Left ventricular function is normal. Nuclear stress EF: 77 %.   Low risk study with breast attenuation artifact, no ischemia, LVEF 77%.   Echocardiogram on 03/14/2021:   1. Left ventricular ejection fraction, by estimation, is 55 to 60%. The  left ventricle has normal function. The left ventricle has no regional  wall motion abnormalities. Left ventricular diastolic parameters are  consistent with Grade II diastolic  dysfunction (pseudonormalization).   2. Right ventricular systolic function is normal. The right ventricular  size is normal. There is mildly elevated pulmonary artery systolic  pressure. The estimated right ventricular systolic pressure is 44.0 mmHg.   3. Left atrial size was moderately dilated.   4. The mitral valve is abnormal. Mild mitral valve  regurgitation.   5. Tricuspid valve regurgitation is moderate.   6. The aortic valve is tricuspid. There is mild calcification of the  aortic valve. Aortic valve regurgitation is trivial. Mild aortic valve  sclerosis is present, with no evidence of aortic valve stenosis.   7. The inferior vena cava is normal in size with <50% respiratory  variability, suggesting right atrial pressure of 8 mmHg.   Comparison(s): No significant change from prior study.  Vascular US  lower extremity venous reflux bilateral on 02/22/2021: Summary:  Right:  - No evidence of deep vein thrombosis seen in the right lower extremity,  from the common femoral through the popliteal veins.  - No evidence of superficial venous thrombosis in the right lower  extremity.  - There is no evidence of venous reflux seen in the right lower extremity.    Left:  - No evidence of deep vein thrombosis seen in the left lower extremity,  from the common femoral through the popliteal veins.  - No evidence of superficial venous thrombosis in the left lower  extremity.  - Venous reflux is noted in the left greater saphenous vein at the knee  and in the proximal calf.  Recent Labs: No results found for requested labs within last 365 days.  Recent Lipid Panel    Component Value Date/Time   CHOL 152 09/20/2022 1553   CHOL 145 01/29/2021 1137   TRIG 76 09/20/2022 1553   HDL 60 09/20/2022 1553   HDL 62  01/29/2021 1137   CHOLHDL 2.5 09/20/2022 1553   VLDL 15 09/20/2022 1553   LDLCALC 77 09/20/2022 1553   LDLCALC 62 01/29/2021 1137   LDLDIRECT 158 (H) 06/13/2009 2041     Risk Assessment/Calculations:       The 10-year ASCVD risk score (Arnett DK, et al., 2019) is: 32.6%   Values used to calculate the score:     Age: 15 years     Clincally relevant sex: Female     Is Non-Hispanic African American: No     Diabetic: No     Tobacco smoker: No     Systolic Blood Pressure: 138 mmHg     Is BP treated: Yes     HDL  Cholesterol: 60 mg/dL     Total Cholesterol: 152 mg/dL   Physical Exam:    VS:  BP 138/80   Pulse 77   Ht 5' 1.5 (1.562 m)   Wt 174 lb (78.9 kg)   SpO2 99%   BMI 32.34 kg/m     Wt Readings from Last 3 Encounters:  04/22/24 174 lb (78.9 kg)  09/25/23 175 lb 3.2 oz (79.5 kg)  05/08/23 184 lb (83.5 kg)     GEN: Obese, 78 y.o. female in no acute distress HEENT: Normal NECK: No JVD; No carotid bruits CARDIAC: S1/S2, RRR, no murmurs, rubs, gallops; 2+ pulses RESPIRATORY:  Clear to auscultation without rales, wheezing or rhonchi  MUSCULOSKELETAL:  nonpitting, dependent lower extremity edema, skin discoloration along BLE; No deformity  SKIN: Warm and dry NEUROLOGIC:  Alert and oriented x 3 PSYCHIATRIC:  Normal affect    ASSESSMENT & PLAN:    In order of problems listed above:  1. Prolonged QT Qtc calculation today is 486 ms. Most likely d/t her tramadol  and possibly her Zoloft . Recommended to discuss with PCP for further evaluation. Will check her Mag level for further review. All other recent labs unremarkable. Care and ED precautions discussed.   2. Chest pain of uncertain etiology Etiology atypical and unclear. Getting lab work as noted above. Discussed tx options and no indication for ischemic evaluation at this time. No medication changes. Follow-up with PCP to r/o non-cardiac causes. Care and ED precautions discussed.   3. Chronic diastolic CHF, leg edema, medication management Echo in 2022 showed normal EF, grade 2 DD, repeat TTE was similar. Admits to some leg edema - pt does not want her Lasix  medication to be increased. Last doppler was negative for DVT one year ago. No medication changes at this time. Low sodium diet, fluid restriction <2L, and daily weights encouraged. Educated to contact our office for weight gain of 2 lbs overnight or 5 lbs in one week.   4. HTN BP stable today. Admits to hx of [facility]. BP well controlled at home.  Continue current medication regimen.  Heart healthy diet and regular cardiovascular exercise encouraged.   5. Dyslipidemia Most recent LDL 60. Continue rosuvastatin . Heart healthy diet and regular cardiovascular exercise encouraged.   6.  Carotid artery disease, s/p R endarterectomy in 2016 Most recent carotid duplex revealed moderate blockage along right carotid artery and mild stenosis to left carotid artery. Continue current medication regimen. Heart healthy diet and regular cardiovascular exercise encouraged. Not addressed, but plan to update this study at next office visit.   7. Valvular insufficiency Recent TTE revealed stable mild MR and moderate TR.  Will update echocardiogram in 1-2 years or sooner if clinically indicated.     Disposition: Follow-up with me or APP  in 8 weeks or sooner if anything changes.   Medication Adjustments/Labs and Tests Ordered: Current medicines are reviewed at length with the patient today.  Concerns regarding medicines are outlined above.  Orders Placed This Encounter  Procedures   EKG 12-Lead   No orders of the defined types were placed in this encounter.   Patient Instructions  Medication Instructions:    Labwork:   Testing/Procedures:   Follow-Up: Your physician recommends that you schedule a follow-up appointment in:   Any Other Special Instructions Will Be Listed Below (If Applicable).  If you need a refill on your cardiac medications before your next appointment, please call your pharmacy.   Signed, Almarie Crate, NP

## 2024-04-22 NOTE — Patient Instructions (Addendum)
 Medication Instructions:  Your physician recommends that you continue on your current medications as directed. Please refer to the Current Medication list given to you today.  Labwork: Magnesium level [employer] [address]. [place] or [employer]) Non-fasting  Testing/Procedures: none  Follow-Up: Your physician recommends that you schedule a follow-up appointment in: 8 weeks.  Any Other Special Instructions Will Be Listed Below (If Applicable).  If you need a refill on your cardiac medications before your next appointment, please call your pharmacy.

## 2024-05-11 ENCOUNTER — Ambulatory Visit: Admitting: Adult Health

## 2024-05-14 ENCOUNTER — Ambulatory Visit: Payer: Self-pay | Admitting: Nurse Practitioner

## 2024-05-24 ENCOUNTER — Other Ambulatory Visit: Payer: Self-pay

## 2024-06-08 ENCOUNTER — Ambulatory Visit: Payer: Self-pay | Admitting: Nurse Practitioner

## 2024-06-08 NOTE — Progress Notes (Deleted)
 Cardiology Office Note:    Date:  04/22/2024 ID:  Theresa Brennan, DOB 1946-07-07, MRN [id number] PCP:  Shona Norleen PEDLAR, MD [facility] Providers Cardiologist:  Vinie JAYSON Maxcy, MD   Referring MD: Shona Norleen PEDLAR, MD   CC: Here for overdue follow-up   History of Present Illness:    Theresa Brennan is a delightful 78 y.o. female with a PMH of chronic diastolic CHF, HTN, dyslipidemia, valvular insufficiency, carotid artery disease, s/p right endarterectomy in 2016, interstitial cystitis, and hx of recurrent UTI's, who presents today for overdue follow-up.   Previous CV history includes normal left heart cath in 2016 with normal coronary arteries and normal LV function. Carotid doppler in 2018 showed patent right carotid endarterectomy site with no right ICA stenosis. 1-39% stenosis was noted along the left proximal ICA artery. ABIs in 2020 were normal.  Echocardiogram in 2022 showed stable normal systolic function with moderate diastolic dysfunction.   Last seen by Dr. Maxcy on February 23, 2021.  Patient reported shortness of breath, BNP was found to be minimally elevated.  PCP increased her Lasix  to 40 mg twice daily.  Patient reported not making a lot of urine, she was grossly volume overloaded on exam.  Metolazone  2.5 mg was added was advised to wear compression stockings.  11/15/2022 - She presents today for follow-up. Admits to recent sinus infection/URI symptoms. Denies any chest pain, shortness of breath, palpitations, syncope, presyncope, dizziness, orthopnea, PND, significant weight changes, acute bleeding, or claudication. Notes leg swelling.   Recent ED visit on 04/16/2024 for nausea, vomiting, and diarrhea. Also noted lower abdominal pain. CT of abdomen/pelvis was negative for anything acute.   Today she is here for overdue follow-up. She admits to recent sinus/ear infection. She does see an eye doctor and admits to recent stress. Notes occasional chest pain, brief and no severe or bothersome  per her report, rare in occurrence.  Denies any recent chest pain, shortness of breath, palpitations, syncope, presyncope, dizziness, orthopnea, PND, swelling or significant weight changes, acute bleeding, or claudication.  ROS:     Please see the history of present illness.    All other systems reviewed and are negative.  EKGs/Labs/Other Studies Reviewed:    The following studies were reviewed today:  EKG:       Carotid duplex 02/2023:  Summary:  Right Carotid: Velocities in the right ICA are consistent with a 40-59% stenosis. Non-hemodynamically significant plaque <50% noted  in the CCA. The ECA appears <50% stenosed. The right  endarterectomy site is patent.   Left Carotid: Velocities in the left ICA are consistent with a 1-39%  stenosis. Non-hemodynamically significant plaque <50% noted in the  CCA. The ECA appears <50% stenosed.   Vertebrals:  Bilateral vertebral arteries demonstrate antegrade flow.  Subclavians: Normal flow hemodynamics were seen in bilateral subclavian arteries.   *See table(s) above for measurements and observations.  Suggest follow up study in 12 months.  Echo 11/12/2022:  1. Left ventricular ejection fraction, by estimation, is 60 to 65%. The  left ventricle has normal function. The left ventricle has no regional  wall motion abnormalities. Left ventricular diastolic parameters are  consistent with Grade II diastolic  dysfunction (pseudonormalization). The average left ventricular global  longitudinal strain is -22.1 %. The global longitudinal strain is normal.   2. Right ventricular systolic function is normal. The right ventricular  size is normal. There is mildly elevated pulmonary artery systolic  pressure. The estimated right ventricular systolic pressure is  37.8 mmHg.   3. The mitral valve is degenerative. Mild mitral valve regurgitation.   4. Tricuspid valve regurgitation is moderate.   5. The aortic valve is tricuspid. There is mild  calcification of the  aortic valve. Aortic valve regurgitation is not visualized.   6. The inferior vena cava is normal in size with greater than 50%  respiratory variability, suggesting right atrial pressure of 3 mmHg.   Comparison(s): No significant change from prior study. Prior images  reviewed side by side.  Lexiscan  09/2022:   Findings are consistent with no ischemia. The study is low risk.   No ST deviation was noted. The ECG was negative for ischemia.   LV perfusion is normal.  Breast attenuation evident, no significant reversible perfusion defects to indicate ischemia.   Left ventricular function is normal. Nuclear stress EF: 77 %.   Low risk study with breast attenuation artifact, no ischemia, LVEF 77%.   Echocardiogram on 03/14/2021:   1. Left ventricular ejection fraction, by estimation, is 55 to 60%. The  left ventricle has normal function. The left ventricle has no regional  wall motion abnormalities. Left ventricular diastolic parameters are  consistent with Grade II diastolic  dysfunction (pseudonormalization).   2. Right ventricular systolic function is normal. The right ventricular  size is normal. There is mildly elevated pulmonary artery systolic  pressure. The estimated right ventricular systolic pressure is 44.0 mmHg.   3. Left atrial size was moderately dilated.   4. The mitral valve is abnormal. Mild mitral valve regurgitation.   5. Tricuspid valve regurgitation is moderate.   6. The aortic valve is tricuspid. There is mild calcification of the  aortic valve. Aortic valve regurgitation is trivial. Mild aortic valve  sclerosis is present, with no evidence of aortic valve stenosis.   7. The inferior vena cava is normal in size with <50% respiratory  variability, suggesting right atrial pressure of 8 mmHg.   Comparison(s): No significant change from prior study.  Vascular US  lower extremity venous reflux bilateral on 02/22/2021: Summary:  Right:  - No  evidence of deep vein thrombosis seen in the right lower extremity,  from the common femoral through the popliteal veins.  - No evidence of superficial venous thrombosis in the right lower  extremity.  - There is no evidence of venous reflux seen in the right lower extremity.    Left:  - No evidence of deep vein thrombosis seen in the left lower extremity,  from the common femoral through the popliteal veins.  - No evidence of superficial venous thrombosis in the left lower  extremity.  - Venous reflux is noted in the left greater saphenous vein at the knee  and in the proximal calf.  Recent Labs: No results found for requested labs within last 365 days.  Recent Lipid Panel    Component Value Date/Time   CHOL 152 09/20/2022 1553   CHOL 145 01/29/2021 1137   TRIG 76 09/20/2022 1553   HDL 60 09/20/2022 1553   HDL 62 01/29/2021 1137   CHOLHDL 2.5 09/20/2022 1553   VLDL 15 09/20/2022 1553   LDLCALC 77 09/20/2022 1553   LDLCALC 62 01/29/2021 1137   LDLDIRECT 158 (H) 06/13/2009 2041     Risk Assessment/Calculations:       The 10-year ASCVD risk score (Arnett DK, et al., 2019) is: 32.6%   Values used to calculate the score:     Age: 53 years     Clincally relevant sex: Female  Is Non-Hispanic African American: No     Diabetic: No     Tobacco smoker: No     Systolic Blood Pressure: 138 mmHg     Is BP treated: Yes     HDL Cholesterol: 60 mg/dL     Total Cholesterol: 152 mg/dL   Physical Exam:    VS:  There were no vitals taken for this visit.    Wt Readings from Last 3 Encounters:  04/22/24 174 lb (78.9 kg)  09/25/23 175 lb 3.2 oz (79.5 kg)  05/08/23 184 lb (83.5 kg)     GEN: Obese, 78 y.o. female in no acute distress HEENT: Normal NECK: No JVD; No carotid bruits CARDIAC: S1/S2, RRR, no murmurs, rubs, gallops; 2+ pulses RESPIRATORY:  Clear to auscultation without rales, wheezing or rhonchi  MUSCULOSKELETAL:  nonpitting, dependent lower extremity edema, skin  discoloration along BLE; No deformity  SKIN: Warm and dry NEUROLOGIC:  Alert and oriented x 3 PSYCHIATRIC:  Normal affect    ASSESSMENT & PLAN:    In order of problems listed above:  1. Prolonged QT Qtc calculation today is 486 ms. Most likely d/t her tramadol  and possibly her Zoloft . Recommended to discuss with PCP for further evaluation. Will check her Mag level for further review. All other recent labs unremarkable. Care and ED precautions discussed.   2. Chest pain of uncertain etiology Etiology atypical and unclear. Getting lab work as noted above. Discussed tx options and no indication for ischemic evaluation at this time. No medication changes. Follow-up with PCP to r/o non-cardiac causes. Care and ED precautions discussed.   3. Chronic diastolic CHF, leg edema, medication management Echo in 2022 showed normal EF, grade 2 DD, repeat TTE was similar. Admits to some leg edema - pt does not want her Lasix  medication to be increased. Last doppler was negative for DVT one year ago. No medication changes at this time. Low sodium diet, fluid restriction <2L, and daily weights encouraged. Educated to contact our office for weight gain of 2 lbs overnight or 5 lbs in one week.   4. HTN BP stable today. Admits to hx of [facility]. BP well controlled at home.  Continue current medication regimen. Heart healthy diet and regular cardiovascular exercise encouraged.   5. Dyslipidemia Most recent LDL 60. Continue rosuvastatin . Heart healthy diet and regular cardiovascular exercise encouraged.   6.  Carotid artery disease, s/p R endarterectomy in 2016 Most recent carotid duplex revealed moderate blockage along right carotid artery and mild stenosis to left carotid artery. Continue current medication regimen. Heart healthy diet and regular cardiovascular exercise encouraged. Not addressed, but plan to update this study at next office visit.   7. Valvular insufficiency Recent TTE revealed stable mild MR and  moderate TR.  Will update echocardiogram in 1-2 years or sooner if clinically indicated.     Disposition: Follow-up with me or APP in 8 weeks or sooner if anything changes.   Medication Adjustments/Labs and Tests Ordered: Current medicines are reviewed at length with the patient today.  Concerns regarding medicines are outlined above.  No orders of the defined types were placed in this encounter.  No orders of the defined types were placed in this encounter.   There are no Patient Instructions on file for this visit.   Signed, Almarie Crate, NP

## 2024-06-16 ENCOUNTER — Institutional Professional Consult (permissible substitution) (INDEPENDENT_AMBULATORY_CARE_PROVIDER_SITE_OTHER)

## 2024-06-18 ENCOUNTER — Institutional Professional Consult (permissible substitution) (INDEPENDENT_AMBULATORY_CARE_PROVIDER_SITE_OTHER)

## 2024-06-18 ENCOUNTER — Encounter (INDEPENDENT_AMBULATORY_CARE_PROVIDER_SITE_OTHER): Payer: Self-pay | Admitting: Physician Assistant

## 2024-06-18 ENCOUNTER — Ambulatory Visit (INDEPENDENT_AMBULATORY_CARE_PROVIDER_SITE_OTHER): Admitting: Physician Assistant

## 2024-06-18 ENCOUNTER — Ambulatory Visit (INDEPENDENT_AMBULATORY_CARE_PROVIDER_SITE_OTHER): Admitting: Audiology

## 2024-06-18 VITALS — BP 153/72 | HR 67 | Temp 98.5°F | Ht 61.5 in | Wt 169.0 lb

## 2024-06-18 DIAGNOSIS — H9191 Unspecified hearing loss, right ear: Secondary | ICD-10-CM

## 2024-06-18 DIAGNOSIS — H90A31 Mixed conductive and sensorineural hearing loss, unilateral, right ear with restricted hearing on the contralateral side: Secondary | ICD-10-CM | POA: Diagnosis not present

## 2024-06-18 DIAGNOSIS — H9201 Otalgia, right ear: Secondary | ICD-10-CM | POA: Diagnosis not present

## 2024-06-18 DIAGNOSIS — H90A22 Sensorineural hearing loss, unilateral, left ear, with restricted hearing on the contralateral side: Secondary | ICD-10-CM | POA: Diagnosis not present

## 2024-06-18 NOTE — Progress Notes (Signed)
 Patient admits to being stressed out about other health issues. She says that her BP is usually normal. Advised that patient go to pcp regarding BP.
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
## 2024-06-18 NOTE — Progress Notes (Signed)
  [address] [place], [place] [address] [phone]  Audiological Evaluation    Name: Theresa Brennan     DOB:   12-23-1945      MRN:   [id number]                                                                                     Service Date: 06/18/2024     Accompanied by: husband   Patient comes today after Reyes Cohen, PA-C sent a referral for a hearing evaluation due to concerns with ear pain.   Symptoms Yes Details  Hearing loss  [x]  Right hearing loss  Tinnitus  []    Ear pain/ infections/pressure  [x]  Right ear pain  Balance problems  []    Noise exposure history  []    Previous ear surgeries  [x]  Right ear surgeries  Family history of hearing loss  []    Amplification  []    Other  []      Otoscopy: Right ear: Abnormal eardrum appearance. Left ear:  Clear external ear canal and notable landmarks visualized on the tympanic membrane.  Tympanometry: Right ear: Large external ear canal volume with no middle ear pressure peak or tympanic membrane compliance (Type B). Findings are suggestive of the presence of a tympanic membrane perforation or a pressure equalization (PE) tube. Left ear: Normal external ear canal volume with normal middle ear pressure and tympanic membrane compliance (Type A). Findings are suggestive of normal middle ear function.  Hearing Evaluation The hearing test results were completed under headphones and results are deemed to be of good to fair reliability. Test technique:  conventional    Pure tone Audiometry: Right ear- Moderately severe to profound mixed hearing loss from 250 Hz - 8000 Hz. Left ear-  Normal sloping to moderately severe sensorineural hearing loss from 250 Hz - 8000 Hz.  Speech Audiometry: Right ear- Speech Reception Threshold (SRT) was obtained at 65 dBHL, with contralateral masking. Left ear-Speech Reception Threshold (SRT) was obtained at 10 dBHL.   Word Recognition Score Tested using NU-6 (recorded) Right ear:  44% was obtained at a presentation level of 95 dBHL with contralateral masking which is deemed as  poor. Left ear: 100% was obtained at a presentation level of 70 dBHL with contralateral masking which is deemed as  excellent.   Impression: There is a significant difference in pure-tone thresholds between ears, worse in the right ear.   Recommendations: Follow up with ENT as scheduled for today. Repeat audiogram after medical care. Recommend hearing aid evaluation pending medical clearance and patient interest.   Gemini Beaumier MARIE LEROUX-MARTINEZ, AUD

## 2024-06-23 ENCOUNTER — Encounter: Payer: Self-pay | Admitting: Audiology

## 2024-06-24 ENCOUNTER — Telehealth (INDEPENDENT_AMBULATORY_CARE_PROVIDER_SITE_OTHER): Payer: Self-pay | Admitting: Physician Assistant

## 2024-06-24 DIAGNOSIS — H90A31 Mixed conductive and sensorineural hearing loss, unilateral, right ear with restricted hearing on the contralateral side: Secondary | ICD-10-CM

## 2024-06-24 NOTE — Telephone Encounter (Signed)
 Left voicemail for the patient today.  I like to discuss her audio results.  She had audiological evaluation on 06/18/2024 which showed significant right sided decreased hearing with findings consistent with otosclerosis.  Given her extensive right sided ear history I would like to evaluate this with a CT temporal bone.  This order has been placed.

## 2024-08-06 ENCOUNTER — Ambulatory Visit: Admitting: Nurse Practitioner

## 2024-10-21 ENCOUNTER — Ambulatory Visit: Admitting: Nurse Practitioner

## 2024-11-16 ENCOUNTER — Ambulatory Visit: Admitting: Adult Health

## 2024-11-22 ENCOUNTER — Ambulatory Visit: Admitting: Adult Health
